# Patient Record
Sex: Male | Born: 1990
Health system: Southern US, Community
[De-identification: ages and names within clinical notes are randomized; demographics above are authoritative.]

## PROBLEM LIST (undated history)

## (undated) DIAGNOSIS — I10 Essential (primary) hypertension: Secondary | ICD-10-CM

## (undated) DIAGNOSIS — G118 Other hereditary ataxias: Secondary | ICD-10-CM

## (undated) DIAGNOSIS — F32A Depression, unspecified: Secondary | ICD-10-CM

## (undated) HISTORY — PX: HERNIA REPAIR: SHX51

## (undated) HISTORY — PX: OTHER SURGICAL HISTORY: SHX169

---

## 2011-04-10 ENCOUNTER — Encounter (HOSPITAL_COMMUNITY): Payer: Self-pay

## 2011-04-10 ENCOUNTER — Emergency Department (HOSPITAL_COMMUNITY)
Admission: EM | Admit: 2011-04-10 | Discharge: 2011-04-10 | Disposition: A | Discharge status: Home / Self Care | Payer: Worker's Compensation | Attending: Emergency Medicine | Admitting: Emergency Medicine

## 2011-04-10 DIAGNOSIS — M25519 Pain in unspecified shoulder: Secondary | ICD-10-CM | POA: Insufficient documentation

## 2011-04-10 DIAGNOSIS — Y92410 Unspecified street and highway as the place of occurrence of the external cause: Secondary | ICD-10-CM | POA: Insufficient documentation
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
## 2015-06-01 ENCOUNTER — Emergency Department (HOSPITAL_COMMUNITY)
Admission: EM | Admit: 2015-06-01 | Discharge: 2015-06-01 | Disposition: A | Discharge status: Home / Self Care | Payer: 59 | Attending: Emergency Medicine | Admitting: Emergency Medicine

## 2015-06-01 ENCOUNTER — Emergency Department (HOSPITAL_COMMUNITY): Payer: 59

## 2015-06-01 ENCOUNTER — Encounter (HOSPITAL_COMMUNITY): Payer: Self-pay | Admitting: Emergency Medicine

## 2015-06-01 DIAGNOSIS — Y998 Other external cause status: Secondary | ICD-10-CM | POA: Insufficient documentation

## 2015-06-01 DIAGNOSIS — S62650A Nondisplaced fracture of medial phalanx of right index finger, initial encounter for closed fracture: Secondary | ICD-10-CM | POA: Diagnosis not present

## 2015-06-01 DIAGNOSIS — Y9389 Activity, other specified: Secondary | ICD-10-CM | POA: Insufficient documentation

## 2015-06-01 DIAGNOSIS — S199XXA Unspecified injury of neck, initial encounter: Secondary | ICD-10-CM | POA: Insufficient documentation

## 2015-06-01 DIAGNOSIS — S62609A Fracture of unspecified phalanx of unspecified finger, initial encounter for closed fracture: Secondary | ICD-10-CM

## 2015-06-01 DIAGNOSIS — S6991XA Unspecified injury of right wrist, hand and finger(s), initial encounter: Secondary | ICD-10-CM | POA: Diagnosis present

## 2015-06-01 DIAGNOSIS — S4992XA Unspecified injury of left shoulder and upper arm, initial encounter: Secondary | ICD-10-CM | POA: Diagnosis not present

## 2015-06-01 DIAGNOSIS — Y92410 Unspecified street and highway as the place of occurrence of the external cause: Secondary | ICD-10-CM | POA: Diagnosis not present

## 2015-06-01 MED ORDER — TRAMADOL HCL 50 MG PO TABS: 50.0000 mg | ORAL_TABLET | Freq: Four times a day (QID) | ORAL | Status: DC | PRN

## 2015-06-01 MED ORDER — TRAMADOL HCL 50 MG PO TABS
50.0000 mg | ORAL_TABLET | Freq: Once | ORAL | Status: AC
  Administered 2015-06-01: 50 mg via ORAL
  Filled 2015-06-01: qty 1

## 2015-06-01 MED ORDER — NAPROXEN 500 MG PO TABS: 500.0000 mg | ORAL_TABLET | Freq: Two times a day (BID) | ORAL | Status: DC

## 2015-06-01 MED ORDER — CYCLOBENZAPRINE HCL 10 MG PO TABS
5.0000 mg | ORAL_TABLET | Freq: Once | ORAL | Status: AC
  Administered 2015-06-01: 5 mg via ORAL
  Filled 2015-06-01: qty 1

## 2015-06-01 MED ORDER — CYCLOBENZAPRINE HCL 10 MG PO TABS: 10.0000 mg | ORAL_TABLET | Freq: Two times a day (BID) | ORAL | Status: DC | PRN

## 2015-06-01 NOTE — Discharge Instructions (Signed)
Finger Fracture °Fractures of fingers are breaks in the bones of the fingers. There are many types of fractures. There are different ways of treating these fractures. Your health care provider will discuss the best way to treat your fracture. °CAUSES °Traumatic injury is the main cause of broken fingers. These include: °· Injuries while playing sports. °· Workplace injuries. °· Falls. °RISK FACTORS °Activities that can increase your risk of finger fractures include: °· Sports. °· Workplace activities that involve machinery. °· A condition called osteoporosis, which can make your bones less dense and cause them to fracture more easily. °SIGNS AND SYMPTOMS °The main symptoms of a broken finger are pain and swelling within 15 minutes after the injury. Other symptoms include: °· Bruising of your finger. °· Stiffness of your finger. °· Numbness of your finger. °· Exposed bones (compound fracture) if the fracture is severe. °DIAGNOSIS  °The best way to diagnose a broken bone is with X-ray imaging. Additionally, your health care provider will use this X-ray image to evaluate the position of the broken finger bones.  °TREATMENT  °Finger fractures can be treated with:  °· Nonreduction--This means the bones are in place. The finger is splinted without changing the positions of the bone pieces. The splint is usually left on for about a week to 10 days. This will depend on your fracture and what your health care provider thinks. °· Closed reduction--The bones are put back into position without using surgery. The finger is then splinted. °· Open reduction and internal fixation--The fracture site is opened. Then the bone pieces are fixed into place with pins or some type of hardware. This is seldom required. It depends on the severity of the fracture. °HOME CARE INSTRUCTIONS  °· Follow your health care provider's instructions regarding activities, exercises, and physical therapy. °· Only take over-the-counter or prescription  medicines for pain, discomfort, or fever as directed by your health care provider. °SEEK MEDICAL CARE IF: °You have pain or swelling that limits the motion or use of your fingers. °SEEK IMMEDIATE MEDICAL CARE IF:  °Your finger becomes numb. °MAKE SURE YOU:  °· Understand these instructions. °· Will watch your condition. °· Will get help right away if you are not doing well or get worse. °  °This information is not intended to replace advice given to you by your health care provider. Make sure you discuss any questions you have with your health care provider. °  °Document Released: 06/11/2000 Document Revised: 12/18/2012 Document Reviewed: 10/09/2012 °Elsevier Interactive Patient Education ©2016 [employer] ° °Motor Vehicle Collision °It is common to have multiple bruises and sore muscles after a motor vehicle collision (MVC). These tend to feel worse for the first 24 hours. You may have the most stiffness and soreness over the first several hours. You may also feel worse when you wake up the first morning after your collision. After this point, you will usually begin to improve with each day. The speed of improvement often depends on the severity of the collision, the number of injuries, and the location and nature of these injuries. °HOME CARE INSTRUCTIONS °· Put ice on the injured area. °¨ Put ice in a plastic bag. °¨ Place a towel between your skin and the bag. °¨ Leave the ice on for 15-20 minutes, 3-4 times a day, or as directed by your health care provider. °· Drink enough fluids to keep your urine clear or pale yellow. Do not drink alcohol. °· Take a warm shower or bath once or   twice a day. This will increase blood flow to sore muscles. °· You may return to activities as directed by your caregiver. Be careful when lifting, as this may aggravate neck or back pain. °· Only take over-the-counter or prescription medicines for pain, discomfort, or fever as directed by your caregiver. Do not use aspirin. This may  increase bruising and bleeding. °SEEK IMMEDIATE MEDICAL CARE IF: °· You have numbness, tingling, or weakness in the arms or legs. °· You develop severe headaches not relieved with medicine. °· You have severe neck pain, especially tenderness in the middle of the back of your neck. °· You have changes in bowel or bladder control. °· There is increasing pain in any area of the body. °· You have shortness of breath, light-headedness, dizziness, or fainting. °· You have chest pain. °· You feel sick to your stomach (nauseous), throw up (vomit), or sweat. °· You have increasing abdominal discomfort. °· There is blood in your urine, stool, or vomit. °· You have pain in your shoulder (shoulder strap areas). °· You feel your symptoms are getting worse. °MAKE SURE YOU: °· Understand these instructions. °· Will watch your condition. °· Will get help right away if you are not doing well or get worse. °  °This information is not intended to replace advice given to you by your health care provider. Make sure you discuss any questions you have with your health care provider. °  °Document Released: 02/27/2005 Document Revised: 03/20/2014 Document Reviewed: 07/27/2010 °Elsevier Interactive Patient Education ©2016 [employer] Inc. ° °
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
## 2016-01-28 ENCOUNTER — Encounter (HOSPITAL_BASED_OUTPATIENT_CLINIC_OR_DEPARTMENT_OTHER): Payer: Self-pay

## 2016-01-28 ENCOUNTER — Emergency Department (HOSPITAL_BASED_OUTPATIENT_CLINIC_OR_DEPARTMENT_OTHER): Payer: Worker's Compensation

## 2016-01-28 ENCOUNTER — Emergency Department (HOSPITAL_BASED_OUTPATIENT_CLINIC_OR_DEPARTMENT_OTHER)
Admission: EM | Admit: 2016-01-28 | Discharge: 2016-01-28 | Disposition: A | Discharge status: Home / Self Care | Payer: Worker's Compensation | Attending: Emergency Medicine | Admitting: Emergency Medicine

## 2016-01-28 DIAGNOSIS — S43004A Unspecified dislocation of right shoulder joint, initial encounter: Secondary | ICD-10-CM | POA: Insufficient documentation

## 2016-01-28 DIAGNOSIS — Y990 Civilian activity done for income or pay: Secondary | ICD-10-CM | POA: Diagnosis not present

## 2016-01-28 DIAGNOSIS — I10 Essential (primary) hypertension: Secondary | ICD-10-CM | POA: Diagnosis not present

## 2016-01-28 DIAGNOSIS — Y929 Unspecified place or not applicable: Secondary | ICD-10-CM | POA: Insufficient documentation

## 2016-01-28 DIAGNOSIS — S4991XA Unspecified injury of right shoulder and upper arm, initial encounter: Secondary | ICD-10-CM | POA: Diagnosis present

## 2016-01-28 DIAGNOSIS — W010XXA Fall on same level from slipping, tripping and stumbling without subsequent striking against object, initial encounter: Secondary | ICD-10-CM | POA: Insufficient documentation

## 2016-01-28 DIAGNOSIS — Y939 Activity, unspecified: Secondary | ICD-10-CM | POA: Diagnosis not present

## 2016-01-28 HISTORY — DX: Essential (primary) hypertension: I10

## 2016-01-28 MED ORDER — METHOCARBAMOL 1000 MG/10ML IJ SOLN
1000.0000 mg | Freq: Once | INTRAMUSCULAR | Status: AC
  Administered 2016-01-28: 1000 mg via INTRAVENOUS
  Filled 2016-01-28: qty 10

## 2016-01-28 MED ORDER — FENTANYL CITRATE (PF) 100 MCG/2ML IJ SOLN
50.0000 ug | Freq: Once | INTRAMUSCULAR | Status: AC
  Administered 2016-01-28: 50 ug via INTRAVENOUS
  Filled 2016-01-28: qty 2

## 2016-01-28 MED ORDER — KETOROLAC TROMETHAMINE 30 MG/ML IJ SOLN
INTRAMUSCULAR | Status: AC
  Administered 2016-01-28: 30 mg
  Filled 2016-01-28: qty 1

## 2016-01-28 MED ORDER — DICLOFENAC SODIUM ER 100 MG PO TB24: 100.0000 mg | ORAL_TABLET | Freq: Every day | ORAL | 0 refills | Status: DC

## 2016-01-28 NOTE — ED Provider Notes (Signed)
[facility] Provider Note   CSN: [id number] Arrival date & time: 01/28/16  [id number]     History   Chief Complaint Chief Complaint  Patient presents with  . Shoulder Pain    HPI Samuel Stanley is a 25 y.o. male.  The history is provided by the patient.  Shoulder Pain   This is a new problem. The current episode started 3 to 5 hours ago. The problem occurs constantly. The problem has not changed since onset.The pain is present in the right shoulder. The pain is severe. Associated symptoms include limited range of motion. He has tried nothing for the symptoms. The treatment provided mild relief. There has been a history of trauma (fell with hand outward). Family history is significant for no gout.    Past Medical History:  Diagnosis Date  . Hypertension     There are no active problems to display for this patient.   Past Surgical History:  Procedure Laterality Date  . HERNIA REPAIR    . testicular torsion         Home Medications    Prior to Admission medications   Not on File    Family History No family history on file.  Social History Social History  Substance Use Topics  . Smoking status: Never Smoker  . Smokeless tobacco: Never Used  . Alcohol use No     Allergies   Patient has no known allergies.   Review of Systems Review of Systems  Musculoskeletal: Positive for arthralgias.  Neurological: Negative for dizziness, facial asymmetry and headaches.  All other systems reviewed and are negative.    Physical Exam Updated Vital Signs BP 141/98 (BP Location: Left Arm)   Pulse 106   Temp 98.7 F (37.1 C) (Oral)   Resp 18   Ht 6' (1.829 m)   Wt 275 lb (124.7 kg)   SpO2 98%   BMI 37.30 kg/m   Physical Exam  Constitutional: He is oriented to person, place, and time. He appears well-developed and well-nourished.  HENT:  Head: Normocephalic and atraumatic.  Eyes: Conjunctivae and EOM are normal.  Neck: Normal range of motion. Neck  supple.  Cardiovascular: Normal rate, regular rhythm and intact distal pulses.   Pulmonary/Chest: Effort normal and breath sounds normal. He has no wheezes. He has no rales.  Abdominal: Soft. Bowel sounds are normal. There is no tenderness.  Musculoskeletal:       Right shoulder: He exhibits decreased range of motion and deformity. He exhibits no swelling, no effusion, no crepitus, no laceration, no spasm and normal pulse.       Right elbow: Normal.      Right wrist: Normal.       Right hand: Normal. Normal sensation noted. Normal strength noted.  Neurological: He is alert and oriented to person, place, and time.  Skin: Skin is warm and dry. Capillary refill takes less than 2 seconds.     ED Treatments / Results   Vitals:   01/28/16 0415 01/28/16 0430  BP: 117/94 136/79  Pulse: 79 77  Resp:    Temp:      Radiology Dg Shoulder Right  Result Date: 01/28/2016 CLINICAL DATA:  Larey SeatFell at work 3 hours ago EXAM: RIGHT SHOULDER - 2+ VIEW COMPARISON:  None. FINDINGS: A single view of the right shoulder demonstrates glenohumeral dislocation, probably anterior subcoracoid. No fracture is evident. IMPRESSION: Right shoulder dislocation Electronically Signed   By: Ellery Plunkaniel R Mitchell M.D.   On: 01/28/2016 03:09  Procedures Reduction of dislocation Date/Time: 01/28/2016 4:30 AM Performed by: Cy BlamerPALUMBO, Samuel Stanley Authorized by: Cy BlamerPALUMBO, Niylah Hassan  Consent: Verbal consent obtained. Consent given by: patient Patient identity confirmed: arm band Preparation: Patient was prepped and draped in the usual sterile fashion. Local anesthesia used: no  Anesthesia: Local anesthesia used: no  Sedation: Patient sedated: no Patient tolerance: Patient tolerated the procedure well with no immediate complications Comments: Stimson maneuver:  Fentanyl, Toradol and robaxin used with 10 lbs of weights.  Padding around elbow, patient in the prone position and 10 lbs of weights in ortho sock hung from around elbow.   Patient felt "pop" after 25 minutes.  FROM of the right shoulder thereafter.  Patient pain free.  Shoulder immobilizer placed.  Post reduction films obtained.      (including critical care time)  Medications Ordered in ED Medications  ketorolac (TORADOL) 30 MG/ML injection (30 mg  Given 01/28/16 0307)  methocarbamol (ROBAXIN) injection 1,000 mg (1,000 mg Intravenous Given 01/28/16 0359)  fentaNYL (SUBLIMAZE) injection 50 mcg (50 mcg Intravenous Given 01/28/16 0347)      Final Clinical Impressions(s) / ED Diagnoses  Anterior shoulder dislocation:  Cannot work until cleared by orthopedics. Schedule an appointment for this week.  Continue to ice the shoulder.  Wear shoulder immobilizer at all times until instructed to stop by orthopedics.  All questions answered to patient's satisfaction. Based on history and exam patient has been appropriately medically screened and emergency conditions excluded. Patient is stable for discharge at this time. Follow up with your PMD for recheck in 2 days and strict return precautions given.     Cy BlamerApril Vaniyah Lansky, MD 01/28/16 [phone]

## 2016-01-28 NOTE — ED Triage Notes (Signed)
Pt states at work, tripped over plastic and as falling caught his fall with rt hand, heard a pop; rt shoulder deformity noted

## 2016-01-28 NOTE — ED Notes (Signed)
Back from xray, alert, NAD, calm, interactive.  

## 2016-01-28 NOTE — ED Notes (Signed)
Post reduction film complete, shoulder reduced, shoulder immobilizer in place, VSS, "feel better".

## 2016-01-28 NOTE — ED Notes (Signed)
To x-ray

## 2016-01-28 NOTE — ED Notes (Signed)
[facility] placed prone, R arm hanging anteriorly, attempting reduction by Stimson maneuver. Tolerating well, denies pain at this time.

## 2016-01-28 NOTE — ED Notes (Addendum)
Shoulder reduced, repositioned supine, HOB 45 degrees, "feel better", alert, NAD, calm, interactive. Pending post reduction films. VSS. CMS intact, cap refill <2sec.

## 2016-02-17 ENCOUNTER — Ambulatory Visit (INDEPENDENT_AMBULATORY_CARE_PROVIDER_SITE_OTHER): Payer: Self-pay | Admitting: Orthopedic Surgery

## 2016-03-01 ENCOUNTER — Encounter (INDEPENDENT_AMBULATORY_CARE_PROVIDER_SITE_OTHER): Payer: Self-pay | Admitting: Orthopedic Surgery

## 2016-03-01 ENCOUNTER — Encounter (INDEPENDENT_AMBULATORY_CARE_PROVIDER_SITE_OTHER): Payer: Self-pay | Admitting: Radiology

## 2016-03-01 ENCOUNTER — Ambulatory Visit (INDEPENDENT_AMBULATORY_CARE_PROVIDER_SITE_OTHER): Payer: Worker's Compensation | Admitting: Orthopedic Surgery

## 2016-03-01 DIAGNOSIS — S43014A Anterior dislocation of right humerus, initial encounter: Secondary | ICD-10-CM | POA: Insufficient documentation

## 2016-03-01 DIAGNOSIS — S43014D Anterior dislocation of right humerus, subsequent encounter: Secondary | ICD-10-CM

## 2016-03-01 NOTE — Progress Notes (Signed)
   Office Visit Note   Patient: Samuel Stanley           Date of Birth: October 11, 1990           MRN: [id number] Visit Date: 03/01/2016 Requested by: No referring provider defined for this encounter. PCP: No primary care provider on file.  Subjective: Chief Complaint  Patient presents with  . Right Shoulder - Pain    HPIJustin Margo Stanley is a 25 year old patient with anterior shoulder dislocation.  This occurred 01/28/2016.  He has been in a sling since that time.  He fell at work with his hand outstretched in front.  This did require reduction in the emergency room.  He works in [employer] but his job is primarily operating a Chief Executive Officerforklift.  He states his shoulder is feeling good.  There is no history of prior dislocation or prior shoulder issue.              Review of Systems All systems reviewed are negative as they relate to the chief complaint within the history of present illness.  Patient denies  fevers or chills.    Assessment & Plan: Visit Diagnoses:  1. Anterior dislocation of right shoulder, subsequent encounter     Plan: Impression is right shoulder anterior dislocation now r evidence of rotator cuff weakness.  Plan is allow him to return to work tomorrow with no overhead lifting greater than 10 pounds.  I would like him to do 4 weeks of therapy 1-2 times a week to work on strengthening.  He has about a 40% chance of re-dislocating the shoulder.  He does not do much in terms of overhead activity either at work or in the recreational since.  I'll see him back in 4 weeks for clinical recheck and likely release at that time  Follow-Up Instructions: Return in about 4 weeks (around 03/29/2016).   Orders:  No orders of the defined types were placed in this encounter.  No orders of the defined types were placed in this encounter.     Procedures: No procedures performed   Clinical Data: No additional findings.  Objective: Vital Signs: There were no vitals taken for this  visit.  Physical Exam  Ortho Exam  Specialty Comments:  No specialty comments available.  Imaging: No results found.   PMFS History: Patient Active Problem List   Diagnosis Date Noted  . Anterior dislocation of right shoulder 03/01/2016   Past Medical History:  Diagnosis Date  . Hypertension     No family history on file.  Past Surgical History:  Procedure Laterality Date  . HERNIA REPAIR    . testicular torsion     Social History   Occupational History  . Not on file.   Social History Main Topics  . Smoking status: Never Smoker  . Smokeless tobacco: Never Used  . Alcohol use No  . Drug use: No  . Sexual activity: Not on file

## 2016-03-02 NOTE — Progress Notes (Signed)
Faxed PT rx and Dr. Diamantina Providenceean's last office note to adj Madison HickmanMatt Quales  Fax#[phone]

## 2016-03-23 ENCOUNTER — Telehealth (INDEPENDENT_AMBULATORY_CARE_PROVIDER_SITE_OTHER): Payer: Self-pay | Admitting: Orthopedic Surgery

## 2016-03-23 NOTE — Telephone Encounter (Signed)
Kristen with [facility] called needing Rx for (PT) faxed over to her. The fax # is [phone]  The phone number is [phone]   Reference # is [id number]

## 2016-03-24 NOTE — Telephone Encounter (Signed)
faxed

## 2016-03-28 NOTE — Progress Notes (Signed)
PT is approved and was sent to [facility] for scheduling

## 2016-03-29 ENCOUNTER — Ambulatory Visit (INDEPENDENT_AMBULATORY_CARE_PROVIDER_SITE_OTHER): Payer: Self-pay | Admitting: Orthopedic Surgery

## 2016-04-25 ENCOUNTER — Ambulatory Visit (INDEPENDENT_AMBULATORY_CARE_PROVIDER_SITE_OTHER): Payer: Worker's Compensation | Admitting: Orthopedic Surgery

## 2016-04-25 ENCOUNTER — Encounter (INDEPENDENT_AMBULATORY_CARE_PROVIDER_SITE_OTHER): Payer: Self-pay | Admitting: Orthopedic Surgery

## 2016-04-25 DIAGNOSIS — S43014D Anterior dislocation of right humerus, subsequent encounter: Secondary | ICD-10-CM | POA: Diagnosis not present

## 2016-04-25 NOTE — Progress Notes (Signed)
Office Visit Note   Patient: Samuel Stanley           Date of Birth: 1990/05/30           MRN: [id number] Visit Date: 04/25/2016 Requested by: No referring provider defined for this encounter. PCP: No primary care provider on file.  Subjective: Chief Complaint  Patient presents with  . Right Shoulder - Follow-up    HPI Samuel Stanley is a [age] show patient with right shoulder dislocation.  Dislocation occurred 01/18/2016.  His back to driving a forklift.  He is doing fine.  He has no shoulder pain and no episodes of symptomatic instability with the shoulder.  He takes Advil as needed.  He denies any weakness.              Review of Systems All systems reviewed are negative as they relate to the chief complaint within the history of present illness.  Patient denies  fevers or chills.    Assessment & Plan: Visit Diagnoses:  1. Anterior dislocation of right shoulder, subsequent encounter     Plan:Right shoulder dislocation with no sequelae.  He has excellent strength and only slightly limited external rotation at 15 of abduction.  Rotator cuff strength is excellent.  He's having no mechanical symptoms in the shoulder.  He is released back to regular duty work at this time with no restrictions.  No rating required because he is excellent range of motion.  Excellent strength.  Only slightly restricted on range of motion but I think that that will improve over time.  I still think he is at risk for dislocation with high risk activities but he doesn't do much high risk activities as I think he should be okay.  I will see him back as needed.  Patient is at maximal medical improvement  Follow-Up Instructions: No Follow-up on file.   Orders:  No orders of the defined types were placed in this encounter.  No orders of the defined types were placed in this encounter.     Procedures: No procedures performed   Clinical Data: No additional findings.  Objective: Vital Signs: There were no vitals  taken for this visit.  Physical Exam   Constitutional: Patient appears well-developed HEENT:  Head: Normocephalic Eyes:EOM are normal Neck: Normal range of motion Cardiovascular: Normal rate Pulmonary/chest: Effort normal Neurologic: Patient is alert Skin: Skin is warm Psychiatric: Patient has normal mood and affect   Ortho Exam  orthopedic exam demonstrates good cervical spine range of motion excellent rotator cuff strength on the right to isolated and statusand subscap muscle testing.  Motor sensory function to the hand is intact shoulder stability is excellent anterior posterior with no apprehension and negative relocation testing he's restricted about 15 more external rotation on the right compared to the left.  No course grinding or crepitus with passive range of motion of the shoulder   Specialty Comments:  No specialty comments available.  Imaging: No results found.   PMFS History: Patient Active Problem List   Diagnosis Date Noted  . Anterior dislocation of right shoulder 03/01/2016   Past Medical History:  Diagnosis Date  . Hypertension     No family history on file.  Past Surgical History:  Procedure Laterality Date  . HERNIA REPAIR    . testicular torsion     Social History   Occupational History  . Not on file.   Social History Main Topics  . Smoking status: Never Smoker  . Smokeless tobacco: Never Used  .  Alcohol use No  . Drug use: No  . Sexual activity: Not on file

## 2017-08-14 ENCOUNTER — Ambulatory Visit (INDEPENDENT_AMBULATORY_CARE_PROVIDER_SITE_OTHER): Payer: 59

## 2017-08-14 ENCOUNTER — Ambulatory Visit (INDEPENDENT_AMBULATORY_CARE_PROVIDER_SITE_OTHER): Payer: 59 | Admitting: Orthopaedic Surgery

## 2017-08-14 DIAGNOSIS — M25572 Pain in left ankle and joints of left foot: Secondary | ICD-10-CM

## 2017-08-14 NOTE — Progress Notes (Signed)
Office Visit Note   Patient: Samuel Stanley           Date of Birth: 11/07/1990           MRN: [id number] Visit Date: 08/14/2017              Requested by: No referring provider defined for this encounter. PCP: Ila McgillAllwardt, Alyssa, PA   Assessment & Plan: Visit Diagnoses:  1. Pain in left ankle and joints of left foot     Plan: Impression contusion left ankle sprain.  Recommend ASO brace for support.  Referral for physical therapy was provided today.  Desk duty for 1 month.  Follow-up in 4 weeks for recheck.  Follow-Up Instructions: Return in about 1 month (around 09/11/2017).   Orders:  Orders Placed This Encounter  Procedures  . XR Foot Complete Left  . XR Ankle 2 Views Left   No orders of the defined types were placed in this encounter.     Procedures: No procedures performed   Clinical Data: No additional findings.   Subjective: Chief Complaint  Patient presents with  . Left Ankle - Pain    DOI 07/24/17 - dropped jar of pickles on foot  . Left Foot - Pain    DOI - 07/31/17 stepped in a hole, twisting ankle    Patient comes in with 2 injuries to his left ankle and foot in the recent history.  States that he had a jar pickles fell directly on top of his foot and then a week later he twisted his ankle while stepping into a hole.  He denies any numbness and tingling.  The pain is worse with weightbearing.  Is tender to palpation.  Denies any numbness and tingling.   Review of Systems  Constitutional: Negative.   All other systems reviewed and are negative.    Objective: Vital Signs: There were no vitals taken for this visit.  Physical Exam  Constitutional: He is oriented to person, place, and time. He appears well-developed and well-nourished.  HENT:  Head: Normocephalic and atraumatic.  Eyes: Pupils are equal, round, and reactive to light.  Neck: Neck supple.  Pulmonary/Chest: Effort normal.  Abdominal: Soft.  Musculoskeletal: Normal range of motion.    Neurological: He is alert and oriented to person, place, and time.  Skin: Skin is warm.  Psychiatric: He has a normal mood and affect. His behavior is normal. Judgment and thought content normal.  Nursing note and vitals reviewed.   Ortho Exam Left ankle foot exam shows no significant swelling.  He does have tenderness palpation of the metatarsal along the dorsal aspect.  The ATFL is tender to palpation.  Ankle joint is stable. Specialty Comments:  No specialty comments available.  Imaging: Xr Foot Complete Left  Result Date: 08/14/2017 No acute or structural abnormalities  Xr Ankle 2 Views Left  Result Date: 08/14/2017 No acute or structural abnormalities.    PMFS History: Patient Active Problem List   Diagnosis Date Noted  . Anterior dislocation of right shoulder 03/01/2016   Past Medical History:  Diagnosis Date  . Hypertension     No family history on file.  Past Surgical History:  Procedure Laterality Date  . HERNIA REPAIR    . testicular torsion     Social History   Occupational History  . Not on file  Tobacco Use  . Smoking status: Never Smoker  . Smokeless tobacco: Never Used  Substance and Sexual Activity  . Alcohol use: No  .  Drug use: No  . Sexual activity: Not on file       

## 2017-08-15 ENCOUNTER — Telehealth (INDEPENDENT_AMBULATORY_CARE_PROVIDER_SITE_OTHER): Payer: Self-pay | Admitting: Orthopaedic Surgery

## 2017-08-15 NOTE — Telephone Encounter (Signed)
Patient called wanting to know the status of his FMLA papers. CB # [id number]

## 2017-08-28 ENCOUNTER — Ambulatory Visit (INDEPENDENT_AMBULATORY_CARE_PROVIDER_SITE_OTHER): Payer: Self-pay | Admitting: Orthopaedic Surgery

## 2017-09-11 ENCOUNTER — Encounter (INDEPENDENT_AMBULATORY_CARE_PROVIDER_SITE_OTHER): Payer: Self-pay | Admitting: Orthopaedic Surgery

## 2017-09-11 ENCOUNTER — Ambulatory Visit (INDEPENDENT_AMBULATORY_CARE_PROVIDER_SITE_OTHER): Payer: 59 | Admitting: Orthopaedic Surgery

## 2017-09-11 VITALS — Ht 72.0 in | Wt 275.0 lb

## 2017-09-11 DIAGNOSIS — M25572 Pain in left ankle and joints of left foot: Secondary | ICD-10-CM | POA: Diagnosis not present

## 2017-09-11 NOTE — Progress Notes (Signed)
   Office Visit Note   Patient: Samuel Stanley           Date of Birth: 13-Aug-1990           MRN: [id number] Visit Date: 09/11/2017              Requested by: Allwardt, [place] Inn[place], PA [address] [place], [place] [id number] PCP: Ila McgillAllwardt, Alyssa, PA   Assessment & Plan: Visit Diagnoses:  1. Pain in left ankle and joints of left foot     Plan: Patient has completely recovered from his ankle sprain.  At this point we will release him back to full duty.  Follow-up as needed.  Wean ASO brace as tolerated.  Follow-Up Instructions: Return if symptoms worsen or fail to improve.   Orders:  No orders of the defined types were placed in this encounter.  No orders of the defined types were placed in this encounter.     Procedures: No procedures performed   Clinical Data: No additional findings.   Subjective: Chief Complaint  Patient presents with  . Left Ankle - Follow-up    Patient follows up for his left ankle sprain.  He is feeling 100%.  He has finished physical therapy.  This point he is ready to go back to work.  He has no complaints.   Review of Systems   Objective: Vital Signs: Ht 6' (1.829 m)   Wt 275 lb (124.7 kg)   BMI 37.30 kg/m   Physical Exam  Ortho Exam Left ankle exam is completely benign and unremarkable. Specialty Comments:  No specialty comments available.  Imaging: No results found.   PMFS History: Patient Active Problem List   Diagnosis Date Noted  . Anterior dislocation of right shoulder 03/01/2016   Past Medical History:  Diagnosis Date  . Hypertension     No family history on file.  Past Surgical History:  Procedure Laterality Date  . HERNIA REPAIR    . testicular torsion     Social History   Occupational History  . Not on file  Tobacco Use  . Smoking status: Never Smoker  . Smokeless tobacco: Never Used  Substance and Sexual Activity  . Alcohol use: No  . Drug use: No  . Sexual activity: Not on file

## 2017-09-20 ENCOUNTER — Other Ambulatory Visit: Payer: Self-pay

## 2017-09-20 ENCOUNTER — Encounter (HOSPITAL_BASED_OUTPATIENT_CLINIC_OR_DEPARTMENT_OTHER): Payer: Self-pay | Admitting: Emergency Medicine

## 2017-09-20 ENCOUNTER — Emergency Department (HOSPITAL_BASED_OUTPATIENT_CLINIC_OR_DEPARTMENT_OTHER)
Admission: EM | Admit: 2017-09-20 | Discharge: 2017-09-20 | Disposition: A | Discharge status: Home / Self Care | Payer: No Typology Code available for payment source | Attending: Emergency Medicine | Admitting: Emergency Medicine

## 2017-09-20 ENCOUNTER — Emergency Department (HOSPITAL_BASED_OUTPATIENT_CLINIC_OR_DEPARTMENT_OTHER): Payer: No Typology Code available for payment source

## 2017-09-20 DIAGNOSIS — Y9389 Activity, other specified: Secondary | ICD-10-CM | POA: Diagnosis not present

## 2017-09-20 DIAGNOSIS — I10 Essential (primary) hypertension: Secondary | ICD-10-CM | POA: Insufficient documentation

## 2017-09-20 DIAGNOSIS — S91311A Laceration without foreign body, right foot, initial encounter: Secondary | ICD-10-CM | POA: Diagnosis present

## 2017-09-20 DIAGNOSIS — Y990 Civilian activity done for income or pay: Secondary | ICD-10-CM | POA: Insufficient documentation

## 2017-09-20 DIAGNOSIS — W450XXA Nail entering through skin, initial encounter: Secondary | ICD-10-CM | POA: Insufficient documentation

## 2017-09-20 DIAGNOSIS — Y929 Unspecified place or not applicable: Secondary | ICD-10-CM | POA: Insufficient documentation

## 2017-09-20 MED ORDER — LIDOCAINE-EPINEPHRINE (PF) 2 %-1:200000 IJ SOLN
10.0000 mL | Freq: Once | INTRAMUSCULAR | Status: AC
  Administered 2017-09-20: 10 mL via INTRADERMAL
  Filled 2017-09-20: qty 10

## 2017-09-20 MED ORDER — CIPROFLOXACIN HCL 500 MG PO TABS
500.0000 mg | ORAL_TABLET | Freq: Once | ORAL | Status: AC
  Administered 2017-09-20: 500 mg via ORAL
  Filled 2017-09-20: qty 1

## 2017-09-20 MED ORDER — LIDOCAINE-EPINEPHRINE-TETRACAINE (LET) SOLUTION
3.0000 mL | Freq: Once | NASAL | Status: AC
  Administered 2017-09-20: 3 mL via TOPICAL
  Filled 2017-09-20: qty 3

## 2017-09-20 MED ORDER — CIPROFLOXACIN HCL 500 MG PO TABS: 500.0000 mg | ORAL_TABLET | Freq: Two times a day (BID) | ORAL | 0 refills | Status: DC

## 2017-09-20 NOTE — ED Notes (Signed)
Pt soaking foot in iodine diluted with sterile water solution

## 2017-09-20 NOTE — ED Triage Notes (Signed)
Pt stepped back into a wooden pallet at work and sustained laceration to posterior right heal.

## 2017-09-20 NOTE — ED Notes (Signed)
Pt foot removed from soak and cleansed with soap and water.

## 2017-09-20 NOTE — ED Provider Notes (Signed)
[facility] EMERGENCY DEPARTMENT Provider Note   CSN: [id number] Arrival date & time: 09/20/17  [id number]     History   Chief Complaint Chief Complaint  Patient presents with  . Extremity Laceration    HPI Samuel Stanley is a 27 y.o. male.  The history is provided by the patient.  Laceration   The incident occurred 1 to 2 hours ago. The laceration is located on the right foot. The laceration is 3 cm (2nd superficial laceration 1 cm, both hemostatic) in size. The laceration mechanism was a a nail (nail through a old tennis shoe). The pain is at a severity of 3/10. The pain is mild. The pain has been constant since onset. He reports no foreign bodies present. His tetanus status is UTD.  Backed up into a wooden pallet at work and nail went through the rubber of the tennis shoe.  Has a 3 cm laceration to the heel R foot. And superficial 1 cm lac to medial right foot  Past Medical History:  Diagnosis Date  . Hypertension     Patient Active Problem List   Diagnosis Date Noted  . Anterior dislocation of right shoulder 03/01/2016    Past Surgical History:  Procedure Laterality Date  . HERNIA REPAIR    . testicular torsion          Home Medications    Prior to Admission medications   Medication Sig Start Date End Date Taking? Authorizing Provider  ciprofloxacin (CIPRO) 500 MG tablet Take 1 tablet (500 mg total) by mouth 2 (two) times daily. One po bid x 7 days 09/20/17   Nicanor AlconPalumbo, Logen Heintzelman, MD    Family History No family history on file.  Social History Social History   Tobacco Use  . Smoking status: Never Smoker  . Smokeless tobacco: Never Used  Substance Use Topics  . Alcohol use: No  . Drug use: No     Allergies   Patient has no known allergies.   Review of Systems Review of Systems  Constitutional: Negative for fever.  Skin: Positive for wound. Negative for color change.  Neurological: Negative for weakness and numbness.  All other systems reviewed  and are negative.    Physical Exam Updated Vital Signs BP (!) 147/97 (BP Location: Left Arm)   Pulse 86   Temp 99.2 F (37.3 C) (Oral)   Resp 18   Ht 6' (1.829 m)   Wt 105.7 kg (233 lb)   SpO2 100%   BMI 31.60 kg/m   Physical Exam  Constitutional: He is oriented to person, place, and time. He appears well-developed and well-nourished. No distress.  HENT:  Head: Normocephalic and atraumatic.  Mouth/Throat: No oropharyngeal exudate.  Eyes: Pupils are equal, round, and reactive to light. Conjunctivae are normal.  Neck: Normal range of motion. Neck supple.  Cardiovascular: Normal rate, regular rhythm, normal heart sounds and intact distal pulses.  Pulmonary/Chest: Effort normal and breath sounds normal. No stridor. He has no wheezes. He has no rales.  Abdominal: Soft. Bowel sounds are normal. He exhibits no mass. There is no tenderness. There is no guarding.  Musculoskeletal: Normal range of motion.       Feet:  Neurological: He is alert and oriented to person, place, and time. He displays normal reflexes.  Skin: Skin is warm and dry. Capillary refill takes less than 2 seconds.  Psychiatric: He has a normal mood and affect.     ED Treatments / Results  Labs (all labs ordered are  listed, but only abnormal results are displayed) Labs Reviewed - No data to display  EKG None  Radiology Dg Foot Complete Right  Result Date: 09/20/2017 CLINICAL DATA:  Right foot pain. Stepped onto a wooden Pallet this morning laceration. Question foreign body. EXAM: RIGHT FOOT COMPLETE - 3+ VIEW COMPARISON:  None. FINDINGS: There is no evidence of fracture or dislocation. Hammertoe deformity of the third through fifth toes. There is no evidence of arthropathy or other focal bone abnormality. No soft tissue air or radiopaque foreign body. Soft tissues are unremarkable. IMPRESSION: No acute osseous abnormality or radiopaque foreign body. Site of clinical laceration is not well-defined  radiographically. Electronically Signed   By: Rubye Oaks M.D.   On: 09/20/2017 05:10    Procedures .Marland KitchenLaceration Repair Date/Time: 09/20/2017 6:18 AM Performed by: Cy Blamer, MD Authorized by: Cy Blamer, MD   Consent:    Consent obtained:  Verbal   Consent given by:  Patient   Risks discussed:  Infection, need for additional repair, nerve damage, poor wound healing, poor cosmetic result, pain, retained foreign body, tendon damage and vascular damage   Alternatives discussed:  No treatment Anesthesia (see MAR for exact dosages):    Anesthesia method:  Topical application and local infiltration   Topical anesthetic:  LET   Local anesthetic:  Lidocaine 1% WITH epi Laceration details:    Location:  Foot   Foot location:  R heel   Length (cm):  3   Depth (mm):  1 Repair type:    Repair type:  Simple Pre-procedure details:    Preparation:  Patient was prepped and draped in usual sterile fashion Exploration:    Hemostasis achieved with:  Direct pressure   Wound extent: no areolar tissue violation noted, no foreign bodies/material noted, no nerve damage noted, no tendon damage noted and no underlying fracture noted   Treatment:    Area cleansed with:  Betadine, saline and Shur-Clens   Amount of cleaning:  Extensive   Irrigation solution:  Sterile saline Skin repair:    Repair method:  Sutures   Suture size:  4-0   Suture material:  Nylon   Suture technique:  Simple interrupted   Number of sutures:  4 Approximation:    Approximation:  Close Post-procedure details:    Dressing:  Sterile dressing and bulky dressing   Patient tolerance of procedure:  Tolerated well, no immediate complications Comments:     Sterile dressing then kerlex then hospital sock (clean from package) then post op shoe.  Have advised only new, clean, white socks   (including critical care time)  Medications Ordered in ED Medications  ciprofloxacin (CIPRO) tablet 500 mg (500 mg Oral Given  09/20/17 0529)  lidocaine-EPINEPHrine-tetracaine (LET) solution (3 mLs Topical Given 09/20/17 0530)  lidocaine-EPINEPHrine (XYLOCAINE W/EPI) 2 %-1:200000 (PF) injection 10 mL (10 mLs Intradermal Given by Other 09/20/17 0530)    S  Final Clinical Impressions(s) / ED Diagnoses   Final diagnoses:  Laceration of heel without complication, right, initial encounter   Given the fact that this laceration went through the rubber of a tennis shoe that is old and a sock will cover with cipro PO BID.  Will need a wound check in 2 days with occupational medicine of the patient's employer.  Wound care instructions given verbally and in written form.    Return for pain, numbness, changes in vision or speech, fevers >100.4 unrelieved by medication, shortness of breath, intractable vomiting, or diarrhea, abdominal pain, Inability to tolerate liquids or  food, cough, altered mental status or any concerns. No signs of systemic illness or infection. The patient is nontoxic-appearing on exam and vital signs are within normal limits. Will refer to urology for microscopy hematuria as patient is asymptomatic.  I have reviewed the triage vital signs and the nursing notes. Pertinent labs &imaging results that were available during my care of the patient were reviewed by me and considered in my medical decision making (see chart for details).  After history, exam, and medical workup I feel the patient has been appropriately medically screened and is safe for discharge home. Pertinent diagnoses were discussed with the patient. Patient was given return precautions.  ED Discharge Orders        Ordered    ciprofloxacin (CIPRO) 500 MG tablet  2 times daily     09/20/17 0607       Sani Loiseau, MD 09/20/17 4010

## 2018-03-14 ENCOUNTER — Other Ambulatory Visit: Payer: Self-pay | Admitting: Family Medicine

## 2018-03-14 ENCOUNTER — Other Ambulatory Visit (HOSPITAL_COMMUNITY): Payer: Self-pay | Admitting: Family Medicine

## 2018-03-14 DIAGNOSIS — E6609 Other obesity due to excess calories: Secondary | ICD-10-CM | POA: Diagnosis not present

## 2018-03-14 DIAGNOSIS — R519 Headache, unspecified: Secondary | ICD-10-CM

## 2018-03-14 DIAGNOSIS — G43909 Migraine, unspecified, not intractable, without status migrainosus: Secondary | ICD-10-CM | POA: Diagnosis not present

## 2018-03-14 DIAGNOSIS — Z0001 Encounter for general adult medical examination with abnormal findings: Secondary | ICD-10-CM | POA: Diagnosis not present

## 2018-03-14 DIAGNOSIS — Z6834 Body mass index (BMI) 34.0-34.9, adult: Secondary | ICD-10-CM | POA: Diagnosis not present

## 2018-03-14 DIAGNOSIS — R51 Headache: Principal | ICD-10-CM

## 2018-03-19 ENCOUNTER — Ambulatory Visit (HOSPITAL_COMMUNITY)
Admission: RE | Admit: 2018-03-19 | Discharge: 2018-03-19 | Disposition: A | Discharge status: Home / Self Care | Payer: 59 | Source: Ambulatory Visit | Attending: Family Medicine | Admitting: Family Medicine

## 2018-03-19 DIAGNOSIS — R51 Headache: Secondary | ICD-10-CM | POA: Diagnosis not present

## 2018-03-19 DIAGNOSIS — G43909 Migraine, unspecified, not intractable, without status migrainosus: Secondary | ICD-10-CM | POA: Diagnosis not present

## 2018-03-19 DIAGNOSIS — R519 Headache, unspecified: Secondary | ICD-10-CM

## 2018-03-20 ENCOUNTER — Encounter: Payer: Self-pay | Admitting: Neurology

## 2018-04-16 NOTE — Progress Notes (Signed)
NEUROLOGY CONSULTATION NOTE  Samuel BobJustin Din MRN: [id number] DOB: 07-21-1990  Referring provider: Donzetta Sprungerry Daniel, MD Primary care provider: Donzetta Sprungerry Daniel, MD  Reason for consult:  Migraines, slurred speech, dizziness, cerebellar atrophy  HISTORY OF PRESENT ILLNESS: Samuel Stanley is a 28 year old  man who presents for migraines, dizziness and cerebellar atrophy.  He is accompanied by his wife who supplements history.  He has history of migraines associated with dizziness since he was 28 years old.  In June, his family noticed he would sometimes start shaking in the hands.  In August, he began having having fairly sudden onset of worsening dizziness, migraines and difficulty with balance.  It is a severe pounding bifrontal headache.  In addition to the dizziness, he has associated nausea, photophobia but no vomiting, phonophobia, visual disturbance or unilateral numbness and weakness.  They may last up to 3 hours (1 hour if treated with Excedrin Migraine).  In August, they were frequent (almost daily) and they resolved in late September.  He had another one last week.  No specific triggers.   He has had problems with equilibrium off and on for several years.  During same period of time, he had increased problems with equilibrium with difficulty walking but no falls.  In September, while in [place], [facility]  He states he was walking up on all fours because the steps were "too narrow".  Around the same time, he has had speech disturbance.  During conversation, he may have difficulty getting words out.  While it started around the same time as the other symptoms, this still occurs.  No associated double vision, difficulty swallowing, hearing loss or unilateral numbness or weakness.  Sometimes during a conversation, he will suddenly talk about something else that happened several years ago.  He had an MRI of the brain without contrast on 03/19/18 which was  personally reviewed and demonstrated severe cerebellar atrophy.  Since August 2018, he has had a 70 lb weight loss, not intentional.    When he was 28 years old, his biological mother left, so he doesn't know much about his mother's side of the family.  He was raised by his paternal grandparents.    No history of seizures and has no history of Dilantin use.  He denies history of alcohol abuse.    PAST MEDICAL HISTORY: Past Medical History:  Diagnosis Date  . Hypertension     PAST SURGICAL HISTORY: Past Surgical History:  Procedure Laterality Date  . HERNIA REPAIR    . testicular torsion      MEDICATIONS: Current Outpatient Medications on File Prior to Visit  Medication Sig Dispense Refill  . ciprofloxacin (CIPRO) 500 MG tablet Take 1 tablet (500 mg total) by mouth 2 (two) times daily. One po bid x 7 days 14 tablet 0   No current facility-administered medications on file prior to visit.     ALLERGIES: No Known Allergies  FAMILY HISTORY: No known family history  SOCIAL HISTORY: Social History   Socioeconomic History  . Marital status: Married    Spouse name: Not on file  . Number of children: Not on file  . Years of education: Not on file  . Highest education level: Not on file  Occupational History  . Not on file  Social Needs  . Financial resource strain: Not on file  . Food insecurity:    Worry: Not on file    Inability: Not on  file  . Transportation needs:    Medical: Not on file    Non-medical: Not on file  Tobacco Use  . Smoking status: Never Smoker  . Smokeless tobacco: Never Used  Substance and Sexual Activity  . Alcohol use: No  . Drug use: No  . Sexual activity: Not on file  Lifestyle  . Physical activity:    Days per week: Not on file    Minutes per session: Not on file  . Stress: Not on file  Relationships  . Social connections:    Talks on phone: Not on file    Gets together: Not on file    Attends religious service: Not on file    Active  member of club or organization: Not on file    Attends meetings of clubs or organizations: Not on file    Relationship status: Not on file  . Intimate partner violence:    Fear of current or ex partner: Not on file    Emotionally abused: Not on file    Physically abused: Not on file    Forced sexual activity: Not on file  Other Topics Concern  . Not on file  Social History Narrative  . Not on file    REVIEW OF SYSTEMS: Constitutional: No fevers, chills, or sweats, no generalized fatigue, change in appetite Eyes: No visual changes, double vision, eye pain Ear, nose and throat: No hearing loss, ear pain, nasal congestion, sore throat Cardiovascular: No chest pain, palpitations Respiratory:  No shortness of breath at rest or with exertion, wheezes GastrointestinaI: No nausea, vomiting, diarrhea, abdominal pain, fecal incontinence Genitourinary:  No dysuria, urinary retention or frequency Musculoskeletal:  No neck pain, back pain Integumentary: No rash, pruritus, skin lesions Neurological: as above Psychiatric: No depression, insomnia, anxiety Endocrine: No palpitations, fatigue, diaphoresis, mood swings, change in appetite, change in weight, increased thirst Hematologic/Lymphatic:  No purpura, petechiae. Allergic/Immunologic: no itchy/runny eyes, nasal congestion, recent allergic reactions, rashes  PHYSICAL EXAM: Blood pressure 130/90, pulse 100, height 6' (1.829 m), weight 243 lb 6 oz (110.4 kg), SpO2 93 %. General: No acute distress.  Patient appears well-groomed.   Head:  Normocephalic/atraumatic Eyes:  fundi examined but not visualized Neck: supple, no paraspinal tenderness, full range of motion Back: No paraspinal tenderness Heart: regular rate and rhythm Lungs: Clear to auscultation bilaterally. Vascular: No carotid bruits. Neurological Exam: Mental status: alert and oriented to person, place, and time, recent and remote memory intact, fund of knowledge intact, attention  and concentration intact, speech fluent and not dysarthric, language intact. Cranial nerves: CN I: not tested CN II: pupils equal, round and reactive to light, visual fields intact CN III, IV, VI:  full range of motion, no nystagmus, no ptosis CN V: facial sensation intact CN VII: upper and lower face symmetric CN VIII: hearing intact CN IX, X: gag intact, uvula midline CN XI: sternocleidomastoid and trapezius muscles intact CN XII: tongue midline Bulk & Tone: normal, no fasciculations. Motor:  5/5 throughout  Sensation:  Pinprick and vibration sensation intact.   Deep Tendon Reflexes:  2+ throughout, toes downgoing.  Finger to nose testing:  Tremulous.  Without dysmetria.   Heel to shin:  Some mild dysmetria Gait:  Normal station and stride.  Able to turn, tandem walk unsteady. Romberg negative.  IMPRESSION: 1.  Cerebellar atrophy 2.  Acute/subacute onset of neurologic symptoms, including cognitive changes, tremor, dizziness, unsteady gait.  Given the fairly acute/subacute onset of symptoms, consider autoimmune vs paraneoplastic syndrome (endorses unintentional weight  loss).  Other consideration includes spinocerebellar ataxia (maternal family history unknown).  He does not have history of alcohol abuse or has been on long-term medication use such as phenytoin. 3.  Migraine without aura, without status migrainosus, not intractable  PLAN: 1.  We will repeat MRI of brain WITH contrast to evaluate for any abnormal enhancement to help establish a diagnosis. 2.  We will check serum for B1, B12, and thyroid panel 3.  Following MRI, we will likely perform lumbar puncture for CSF analysis to check for inflammatory and paraneoplastic markers. 4.  Follow up after testing.  Thank you for allowing me to take part in the care of this patient.  Shon Millet, DO  CC: Donzetta Sprung, MD

## 2018-04-18 ENCOUNTER — Ambulatory Visit (INDEPENDENT_AMBULATORY_CARE_PROVIDER_SITE_OTHER): Payer: Commercial Managed Care - PPO | Admitting: Neurology

## 2018-04-18 ENCOUNTER — Encounter: Payer: Self-pay | Admitting: Neurology

## 2018-04-18 ENCOUNTER — Other Ambulatory Visit (INDEPENDENT_AMBULATORY_CARE_PROVIDER_SITE_OTHER): Payer: Commercial Managed Care - PPO

## 2018-04-18 VITALS — BP 130/90 | HR 100 | Ht 72.0 in | Wt 243.4 lb

## 2018-04-18 DIAGNOSIS — G319 Degenerative disease of nervous system, unspecified: Secondary | ICD-10-CM | POA: Diagnosis not present

## 2018-04-18 DIAGNOSIS — R410 Disorientation, unspecified: Secondary | ICD-10-CM | POA: Diagnosis not present

## 2018-04-18 DIAGNOSIS — R6889 Other general symptoms and signs: Secondary | ICD-10-CM

## 2018-04-18 DIAGNOSIS — R270 Ataxia, unspecified: Secondary | ICD-10-CM | POA: Diagnosis not present

## 2018-04-18 DIAGNOSIS — G43009 Migraine without aura, not intractable, without status migrainosus: Secondary | ICD-10-CM

## 2018-04-18 LAB — T4, FREE: Free T4: 1.2 ng/dL (ref 0.60–1.60)

## 2018-04-18 LAB — T3, FREE: T3, Free: 3.5 pg/mL (ref 2.3–4.2)

## 2018-04-18 LAB — VITAMIN B12: Vitamin B-12: 268 pg/mL (ref 211–911)

## 2018-04-18 NOTE — Patient Instructions (Addendum)
1.  We will first check MRI of the brain WITH contrast 2.  We will also check blood work for vitamin B1, B12, and thyroid panel 3.  After MRI, will likely set you up for a spinal tap. 4.  Follow up after testing.  We have sent a referral to [facility] for your MRI and they will call you directly to schedule your appt. They are located at [address] [facility]. If you need to contact them directly please call [phone].

## 2018-04-19 LAB — THYROID PANEL WITH TSH
Free Thyroxine Index: 2.7 (ref 1.4–3.8)
T3 UPTAKE: 37 % — AB (ref 22–35)
T4, Total: 7.3 ug/dL (ref 4.9–10.5)
TSH: 1.23 mIU/L (ref 0.40–4.50)

## 2018-04-19 LAB — THYROID ANTIBODIES
Thyroglobulin Ab: <1 IU/mL (ref <=1)
Thyroperoxidase Ab SerPl-aCnc: <1 IU/mL (ref <9)

## 2018-04-21 LAB — VITAMIN B1: Vitamin B1 (Thiamine): 12 nmol/L (ref 8–30)

## 2018-04-24 ENCOUNTER — Telehealth: Payer: Self-pay | Admitting: *Deleted

## 2018-04-24 ENCOUNTER — Encounter: Payer: Self-pay | Admitting: *Deleted

## 2018-04-24 NOTE — Telephone Encounter (Signed)
-----   Message from Drema Dallas, DO sent at 04/22/2018  7:43 AM EST ----- Blood work is unremarkable

## 2018-04-24 NOTE — Telephone Encounter (Signed)
Results sent via My Chart.  

## 2018-05-02 ENCOUNTER — Ambulatory Visit
Admission: RE | Admit: 2018-05-02 | Discharge: 2018-05-02 | Disposition: A | Discharge status: Home / Self Care | Payer: 59 | Source: Ambulatory Visit | Attending: Neurology | Admitting: Neurology

## 2018-05-02 DIAGNOSIS — G319 Degenerative disease of nervous system, unspecified: Secondary | ICD-10-CM

## 2018-05-02 DIAGNOSIS — R42 Dizziness and giddiness: Secondary | ICD-10-CM | POA: Diagnosis not present

## 2018-05-02 DIAGNOSIS — R410 Disorientation, unspecified: Secondary | ICD-10-CM

## 2018-05-02 DIAGNOSIS — R270 Ataxia, unspecified: Secondary | ICD-10-CM

## 2018-05-02 DIAGNOSIS — R6889 Other general symptoms and signs: Secondary | ICD-10-CM

## 2018-05-02 MED ORDER — GADOBENATE DIMEGLUMINE 529 MG/ML IV SOLN
20.0000 mL | Freq: Once | INTRAVENOUS | Status: AC | PRN
  Administered 2018-05-02: 20 mL via INTRAVENOUS

## 2018-05-06 ENCOUNTER — Telehealth: Payer: Self-pay

## 2018-05-06 NOTE — Telephone Encounter (Signed)
-----   Message from Drema Dallas, DO sent at 05/03/2018  2:02 PM EST ----- MRI with contrast does not show any abnormalities to suggest an underlying inflammatory condition.  I would like Samuel Stanley to make a follow up appointment to discuss further.

## 2018-05-06 NOTE — Telephone Encounter (Signed)
Called and advised Pt 

## 2018-05-14 NOTE — Progress Notes (Signed)
NEUROLOGY FOLLOW UP OFFICE NOTE  Samuel Stanley [id number]  HISTORY OF PRESENT ILLNESS: Samuel Stanley is a 28 year old man who follows up to review MRI.  He is accompanied by his wife.  UPDATE:  He had an MRI of the brain with contrast on 05/02/2018 which was personally reviewed and demonstrated no abnormal contrast enhancement.  B1 and thyroid panel unremarkable.  B12 level low normal range of 268.  Clinically, no change since last month.  HISTORY: He has history of migraines associated with dizziness since he was 28 years old.  In June, his family noticed he would sometimes start shaking in the hands.  In August, he began having having fairly sudden onset of worsening dizziness, migraines and difficulty with balance.  It is a severe pounding bifrontal headache.  In addition to the dizziness, he has associated nausea, photophobia but no vomiting, phonophobia, visual disturbance or unilateral numbness and weakness.  They may last up to 3 hours (1 hour if treated with Excedrin Migraine).  In August, they were frequent (almost daily) and they resolved in late September.  He had another one last week.  No specific triggers.   He has had problems with equilibrium off and on for several years.  During same period of time, he had increased problems with equilibrium with difficulty walking but no falls.  In September, while in [place], he walked up the steps of the [facility] on all fours.  He states he was walking up on all fours because the steps were "too narrow".  Around the same time, he has had speech disturbance.  During conversation, he may have difficulty getting words out.  While it started around the same time as the other symptoms, this still occurs.  No associated double vision, difficulty swallowing, hearing loss or unilateral numbness or weakness.  Sometimes during a conversation, he will suddenly talk about something else that happened several years ago.  He had an MRI of the brain  without contrast on 03/19/18 which was personally reviewed and demonstrated severe cerebellar atrophy.  Since August 2018, he has had a 70 lb weight loss, not intentional.    When he was 28 years old, his biological mother left, so he doesn't know much about his mother's side of the family.  He was raised by his paternal grandparents.    No history of seizures and has no history of Dilantin use.  He denies history of alcohol abuse.   PAST MEDICAL HISTORY: Past Medical History:  Diagnosis Date  . Hypertension     MEDICATIONS: No outpatient encounter medications on file as of 05/15/2018.   No facility-administered encounter medications on file as of 05/15/2018.     ALLERGIES: No known allergies  FAMILY HISTORY: No known family history  SOCIAL HISTORY: Social History   Socioeconomic History  . Marital status: Married    Spouse name: Not on file  . Number of children: 0  . Years of education: 75  . Highest education level: High school graduate  Occupational History  . Not on file  Social Needs  . Financial resource strain: Not on file  . Food insecurity:    Worry: Not on file    Inability: Not on file  . Transportation needs:    Medical: Not on file    Non-medical: Not on file  Tobacco Use  . Smoking status: Never Smoker  . Smokeless tobacco: Never Used  Substance and Sexual Activity  . Alcohol use: No  . Drug use:  No  . Sexual activity: Not on file  Lifestyle  . Physical activity:    Days per week: Not on file    Minutes per session: Not on file  . Stress: Not on file  Relationships  . Social connections:    Talks on phone: Not on file    Gets together: Not on file    Attends religious service: Not on file    Active member of club or organization: Not on file    Attends meetings of clubs or organizations: Not on file    Relationship status: Not on file  . Intimate partner violence:    Fear of current or ex partner: Not on file    Emotionally abused: Not on  file    Physically abused: Not on file    Forced sexual activity: Not on file  Other Topics Concern  . Not on file  Social History Narrative   Lives with wife in a one story home.  No children.  Getting ready to start a new job.  Education: high school.     REVIEW OF SYSTEMS: Constitutional: No fevers, chills, or sweats, no generalized fatigue, change in appetite Eyes: No visual changes, double vision, eye pain Ear, nose and throat: No hearing loss, ear pain, nasal congestion, sore throat Cardiovascular: No chest pain, palpitations Respiratory:  No shortness of breath at rest or with exertion, wheezes GastrointestinaI: No nausea, vomiting, diarrhea, abdominal pain, fecal incontinence Genitourinary:  No dysuria, urinary retention or frequency Musculoskeletal:  No neck pain, back pain Integumentary: No rash, pruritus, skin lesions Neurological: as above Psychiatric: No depression, insomnia, anxiety Endocrine: No palpitations, fatigue, diaphoresis, mood swings, change in appetite, change in weight, increased thirst Hematologic/Lymphatic:  No purpura, petechiae. Allergic/Immunologic: no itchy/runny eyes, nasal congestion, recent allergic reactions, rashes  PHYSICAL EXAM: Blood pressure 122/74, pulse 80, height 6' (1.829 m), weight 251 lb (113.9 kg), SpO2 96 %. General: No acute distress.  Patient appears well-groomed.   Head:  Normocephalic/atraumatic Eyes:  Fundi examined but not visualized Neck: supple, no paraspinal tenderness, full range of motion Heart:  Regular rate and rhythm Lungs:  Clear to auscultation bilaterally Back: No paraspinal tenderness Neurological Exam: alert and oriented to person, place, and time. Attention span and concentration intact, recent and remote memory intact, fund of knowledge intact.  Speech fluent and not dysarthric, language intact.  CN II-XII intact. Bulk and tone normal, muscle strength 5/5 throughout.  Sensation to light touch, temperature and  vibration intact.  Deep tendon reflexes 2+ throughout, toes downgoing.  Finger to nose testing reveals mild tremulousness.  Heel to shin with mild dysmetria.  Gait mildly unsteady.  Unsteady tandem walk. Romberg negative.  IMPRESSION: 1.  Subacute onset of multiple symptomatology (including dizziness, tremor, ataxia, headache, cognitive changes) in setting of cerebellar atrophy.  Given the subacute onset of these various symptoms, I would like to rule out a viral or inflammatory process.    PLAN: 1.  Check MRI of cervical spine with and without contrast to evaluate for any evidence of demyelinating disease (such as NMO) 2.  Lumbar puncture for CSF analysis checking cell count with diff, protein, glucose, IgG index, oligoclonal bands, gram stain and culture, cytology, HSV, Lyme, EBV, WNV, and paraneoplastic panel. 3.  If testing unremarkable, will likely recommend genetic testing for spinocerebellar ataxia.   4. B12 level is borderline normal, therefore will have him start B12 1000 mcg daily 5.  Follow up after testing.  21 minutes spent face to face  with patient, over 50% spent discussing MRI/lab results, differential diagnosis, and plan.  Shon Millet, DO  CC: Donzetta Sprung, MD

## 2018-05-15 ENCOUNTER — Ambulatory Visit (INDEPENDENT_AMBULATORY_CARE_PROVIDER_SITE_OTHER): Payer: 59 | Admitting: Neurology

## 2018-05-15 ENCOUNTER — Encounter: Payer: Self-pay | Admitting: Neurology

## 2018-05-15 VITALS — BP 122/74 | HR 80 | Ht 72.0 in | Wt 251.0 lb

## 2018-05-15 DIAGNOSIS — R270 Ataxia, unspecified: Secondary | ICD-10-CM

## 2018-05-15 DIAGNOSIS — G319 Degenerative disease of nervous system, unspecified: Secondary | ICD-10-CM | POA: Diagnosis not present

## 2018-05-15 DIAGNOSIS — R6889 Other general symptoms and signs: Secondary | ICD-10-CM | POA: Diagnosis not present

## 2018-05-15 NOTE — Patient Instructions (Addendum)
1.  We will check MRI of cervical spine with and without contrast to evaluate for any evidence of demyelinating disease 2.  We will then schedule lumbar puncture to evaluate cerebrospinal fluid for cell count with diff, protein, glucose, IgG index, oligoclonal bands, gram stain and culture, herpes simplex virus, Lyme, Epstein Barr virus, West Nile virus, and paraneoplastic panel. 3. Start B12 1000 mcg daily 4.  Further recommendations pending results.  Follow up after testing.

## 2018-05-19 ENCOUNTER — Encounter (HOSPITAL_COMMUNITY): Payer: Self-pay | Admitting: *Deleted

## 2018-05-19 ENCOUNTER — Ambulatory Visit (HOSPITAL_COMMUNITY)
Admission: EM | Admit: 2018-05-19 | Discharge: 2018-05-19 | Disposition: A | Discharge status: Home / Self Care | Payer: 59 | Attending: Family Medicine | Admitting: Family Medicine

## 2018-05-19 ENCOUNTER — Other Ambulatory Visit: Payer: Self-pay

## 2018-05-19 DIAGNOSIS — R112 Nausea with vomiting, unspecified: Secondary | ICD-10-CM | POA: Diagnosis not present

## 2018-05-19 DIAGNOSIS — R197 Diarrhea, unspecified: Secondary | ICD-10-CM | POA: Diagnosis not present

## 2018-05-19 MED ORDER — ONDANSETRON 4 MG PO TBDP: 4.0000 mg | ORAL_TABLET | Freq: Three times a day (TID) | ORAL | 0 refills | Status: DC | PRN

## 2018-05-19 NOTE — ED Provider Notes (Signed)
[facility]    CSN: [id number] Arrival date & time: 05/19/18  1037     History   Chief Complaint Chief Complaint  Patient presents with  . Emesis  . Diarrhea    HPI Samuel Stanley is a 28 y.o. male history of hypertension presenting today for evaluation of nausea vomiting and diarrhea.  Patient states that symptoms began yesterday.  He has not been able to keep any solid foods down.  He has been able to tolerate ginger ale today.  Has also had associated diarrhea.  Denies blood in the vomit or stool.  Denies any associated abdominal pain.  Denies any previous abdominal surgeries.  Has had some mild rhinorrhea, but this is been going on prior to onset of symptoms.  His wife is having similar symptoms as well.  Denies any fevers.  He has not taken anything over-the-counter for symptoms.  Has started to feel slightly lightheaded.  HPI  Past Medical History:  Diagnosis Date  . Hypertension     Patient Active Problem List   Diagnosis Date Noted  . Anterior dislocation of right shoulder 03/01/2016    Past Surgical History:  Procedure Laterality Date  . HERNIA REPAIR    . testicular torsion         Home Medications    Prior to Admission medications   Medication Sig Start Date End Date Taking? Authorizing Provider  ondansetron (ZOFRAN ODT) 4 MG disintegrating tablet Take 1 tablet (4 mg total) by mouth every 8 (eight) hours as needed for nausea or vomiting. 05/19/18   Allyse Fregeau, Junius Creamer, PA-C    Family History Family History  Problem Relation Age of Onset  . Healthy Mother   . Healthy Father     Social History Social History   Tobacco Use  . Smoking status: Never Smoker  . Smokeless tobacco: Current User    Types: Chew  Substance Use Topics  . Alcohol use: No  . Drug use: No     Allergies   Patient has no known allergies.   Review of Systems Review of Systems  Constitutional: Negative for activity change, appetite change, chills, fatigue and fever.   HENT: Positive for rhinorrhea. Negative for congestion, ear pain, sinus pressure, sore throat and trouble swallowing.   Eyes: Negative for discharge and redness.  Respiratory: Negative for cough, chest tightness and shortness of breath.   Cardiovascular: Negative for chest pain.  Gastrointestinal: Positive for diarrhea, nausea and vomiting. Negative for abdominal pain.  Musculoskeletal: Negative for myalgias.  Skin: Negative for rash.  Neurological: Negative for dizziness, light-headedness and headaches.     Physical Exam Triage Vital Signs ED Triage Vitals  Enc Vitals Group     BP 05/19/18 1052 124/77     Pulse Rate 05/19/18 1051 95     Resp 05/19/18 1051 18     Temp 05/19/18 1051 98.2 F (36.8 C)     Temp Source 05/19/18 1051 Temporal     SpO2 05/19/18 1051 99 %     Weight --      Height --      Head Circumference --      Peak Flow --      Pain Score 05/19/18 1052 0     Pain Loc --      Pain Edu? --      Excl. in GC? --    No data found.  Updated Vital Signs BP 124/77   Pulse 95   Temp 98.2 F (36.8  C) (Temporal)   Resp 18   SpO2 99%   Visual Acuity Right Eye Distance:   Left Eye Distance:   Bilateral Distance:    Right Eye Near:   Left Eye Near:    Bilateral Near:     Physical Exam Vitals signs and nursing note reviewed.  Constitutional:      Appearance: He is well-developed.  HENT:     Head: Normocephalic and atraumatic.     Mouth/Throat:     Comments: Oral mucosa pink and moist, no tonsillar enlargement or exudate. Posterior pharynx patent and nonerythematous, no uvula deviation or swelling. Normal phonation. Eyes:     Conjunctiva/sclera: Conjunctivae normal.  Neck:     Musculoskeletal: Neck supple.  Cardiovascular:     Rate and Rhythm: Normal rate and regular rhythm.     Heart sounds: No murmur.  Pulmonary:     Effort: Pulmonary effort is normal. No respiratory distress.     Breath sounds: Normal breath sounds.     Comments: Breathing  comfortably at rest, CTABL, no wheezing, rales or other adventitious sounds auscultated Abdominal:     Palpations: Abdomen is soft.     Tenderness: There is no abdominal tenderness.     Comments: Abdomen soft, nondistended, striae present across abdomen, nontender to light and deep palpation throughout abdomen  Skin:    General: Skin is warm and dry.  Neurological:     Mental Status: He is alert.      UC Treatments / Results  Labs (all labs ordered are listed, but only abnormal results are displayed) Labs Reviewed - No data to display  EKG None  Radiology No results found.  Procedures Procedures (including critical care time)  Medications Ordered in UC Medications - No data to display  Initial Impression / Assessment and Plan / UC Course  I have reviewed the triage vital signs and the nursing notes.  Pertinent labs & imaging results that were available during my care of the patient were reviewed by me and considered in my medical decision making (see chart for details).     Acute onset of nausea vomiting and diarrhea, vital signs stable, most likely viral gastroenteritis, do not suspect underlying abdominal emergency at this time, nontender.  Will treat symptomatically and supportively, Zofran prescribed.  Push fluids for oral rehydration.  Continue to monitor,Discussed strict return precautions. Patient verbalized understanding and is agreeable with plan.  Final Clinical Impressions(s) / UC Diagnoses   Final diagnoses:  Nausea vomiting and diarrhea     Discharge Instructions     Your nausea, vomiting, and diarrhea appear to have a viral cause. Your symptoms should improve over the next week as your body continues to rid the infectious cause.  For nausea: Zofran prescribed. Begin with every 6 hours, than as you are able to hold food down, take it as needed. Start with clear liquids, then move to plain foods like bananas, rice, applesauce, toast, broth, grits,  oatmeal. As those food settle okay you may transition to your normal foods. Avoid spicy and greasy foods as much as possible.  For Diarrhea: This is your body's natural way of getting rid of a virus. You may try taking 1 imodium to decrease amount of stools a day, but we do not want you to stop your diarrhea.   Preventing dehydration is key! You need to replace the fluid your body is expelling. Drink plenty of fluids, may use Pedialyte or sports drinks.   Please return if you are  experiencing blood in your vomit or stool or experiencing dizziness, lightheadedness, extreme fatigue, increased abdominal pain.    ED Prescriptions    Medication Sig Dispense Auth. Provider   ondansetron (ZOFRAN ODT) 4 MG disintegrating tablet Take 1 tablet (4 mg total) by mouth every 8 (eight) hours as needed for nausea or vomiting. 20 tablet Adya Wirz, Rudyard C, PA-C     Controlled Substance Prescriptions [place] Controlled Substance Registry consulted? Not Applicable   Turhan, [place], [place] 05/19/18 1122

## 2018-05-19 NOTE — ED Triage Notes (Signed)
Reports starting with vomiting and diarrhea with body aches since yesterday without fevers.  Able to keep down PO fluids.

## 2018-05-19 NOTE — ED Notes (Signed)
Pt discharged by provider.

## 2018-05-19 NOTE — Discharge Instructions (Addendum)

## 2018-05-25 ENCOUNTER — Ambulatory Visit
Admission: RE | Admit: 2018-05-25 | Discharge: 2018-05-25 | Disposition: A | Discharge status: Home / Self Care | Payer: 59 | Source: Ambulatory Visit | Attending: Neurology | Admitting: Neurology

## 2018-05-25 ENCOUNTER — Other Ambulatory Visit: Payer: Self-pay

## 2018-05-25 DIAGNOSIS — R6889 Other general symptoms and signs: Secondary | ICD-10-CM

## 2018-05-25 DIAGNOSIS — M50222 Other cervical disc displacement at C5-C6 level: Secondary | ICD-10-CM | POA: Diagnosis not present

## 2018-05-25 MED ORDER — GADOBENATE DIMEGLUMINE 529 MG/ML IV SOLN
20.0000 mL | Freq: Once | INTRAVENOUS | Status: AC | PRN
  Administered 2018-05-25: 20 mL via INTRAVENOUS

## 2018-05-28 ENCOUNTER — Telehealth: Payer: Self-pay | Admitting: *Deleted

## 2018-05-28 ENCOUNTER — Encounter: Payer: Self-pay | Admitting: *Deleted

## 2018-05-28 NOTE — Telephone Encounter (Signed)
-----   Message from Drema Dallas, DO sent at 05/27/2018  7:39 AM EDT ----- MRI of cervical spinal cord is unremarkable.  Proceed with lumbar puncture as planned.

## 2018-05-28 NOTE — Telephone Encounter (Signed)
Results and instructions sent via My Chart.  

## 2018-05-30 ENCOUNTER — Other Ambulatory Visit: Payer: Self-pay

## 2018-06-03 ENCOUNTER — Other Ambulatory Visit: Payer: Self-pay

## 2018-07-05 ENCOUNTER — Other Ambulatory Visit: Payer: Self-pay

## 2018-07-30 ENCOUNTER — Telehealth: Payer: Self-pay | Admitting: Neurology

## 2018-07-30 NOTE — Telephone Encounter (Signed)
Wife left VM about patient having dizziness today and wanting to talk to the nurse. Please call her back at [phone]. Thanks!

## 2018-07-31 NOTE — Telephone Encounter (Signed)
Called and spoke with Pt. He states the dizziness started on Monday. It is present when sitting or when standing. He denies any spinning sensation, he states it feels like a side to side sensation. There is no diarrhea associated with the dizziness. His LP is scheduled for this Friday.

## 2018-07-31 NOTE — Telephone Encounter (Signed)
Now the patient is calling in about the dizziness and wanting to know what to do . Please call his wife back or call him back at [phone]. Thanks!

## 2018-07-31 NOTE — Telephone Encounter (Signed)
Patient left VM about needing to speak with the nurse. Wasn't sure if this was before or after you called him back. Thanks!

## 2018-08-01 MED ORDER — MECLIZINE HCL 25 MG PO TABS: 25.0000 mg | ORAL_TABLET | Freq: Three times a day (TID) | ORAL | 1 refills | Status: DC | PRN

## 2018-08-01 NOTE — Telephone Encounter (Signed)
Spoke with Pt, advised of meclizine and dosing, updated pharmacy

## 2018-08-01 NOTE — Telephone Encounter (Signed)
He can take meclizine 25mg  three times daily as needed.

## 2018-08-02 ENCOUNTER — Ambulatory Visit
Admission: RE | Admit: 2018-08-02 | Discharge: 2018-08-02 | Disposition: A | Discharge status: Home / Self Care | Payer: Managed Care, Other (non HMO) | Source: Ambulatory Visit | Attending: Neurology | Admitting: Neurology

## 2018-08-02 ENCOUNTER — Other Ambulatory Visit: Payer: Self-pay

## 2018-08-02 ENCOUNTER — Other Ambulatory Visit (HOSPITAL_COMMUNITY)
Admission: RE | Admit: 2018-08-02 | Discharge: 2018-08-02 | Disposition: A | Discharge status: Home / Self Care | Payer: Managed Care, Other (non HMO) | Source: Ambulatory Visit | Attending: Neurology | Admitting: Neurology

## 2018-08-02 VITALS — BP 141/75 | HR 64

## 2018-08-02 DIAGNOSIS — R6889 Other general symptoms and signs: Secondary | ICD-10-CM | POA: Diagnosis present

## 2018-08-02 MED ORDER — DIAZEPAM 5 MG PO TABS
10.0000 mg | ORAL_TABLET | Freq: Once | ORAL | Status: AC
  Administered 2018-08-02: 07:00:00 10 mg via ORAL

## 2018-08-02 NOTE — Discharge Instructions (Signed)

## 2018-08-02 NOTE — Progress Notes (Signed)
One SST tube of blood drawn from right AC space for LP labs; site unremarkable. 

## 2018-08-06 ENCOUNTER — Telehealth: Payer: Self-pay | Admitting: Neurology

## 2018-08-06 NOTE — Telephone Encounter (Signed)
Message was left with the after hour answering service on 08-03-18 @ 1:36 pm    Caller Helmut Muster with [facility] has ciritical labs  Lab reference number is   [id number]  08-03-18 @ 1:36 pm called on call provider katherine fuller RN   08-03-18 @ 1:40 Pm Clinical called katherine fuller RN    Lurena Joiner Tat was reached as the on call provider at 1:36   Lab results given CSF culture collected 08-02-18 gram stain no organism or white blood cells seen gram stain prepared by cytospin  Glucose 59, total protein  42 colorless clear no RBC  No WBC received from [facility] with [facility]

## 2018-08-11 LAB — PROTEIN, CSF: Total Protein, CSF: 42 mg/dL (ref 15–45)

## 2018-08-11 LAB — BORRELIA SPECIES DNA, FLUID, PCR: B. burgdorferi DNA: NOT DETECTED

## 2018-08-11 LAB — CNS IGG SYNTHESIS RATE, CSF+BLOOD
Albumin Serum: 4.4 g/dL (ref 3.5–5.2)
Albumin, CSF: 22.4 mg/dL (ref 8.0–42.0)
CNS-IgG Synthesis Rate: -1 mg/24 h (ref <=3.3)
IgG (Immunoglobin G), Serum: 888 mg/dL (ref 600–1640)
IgG Total CSF: 2.5 mg/dL (ref 0.8–7.7)
IgG-Index: 0.55 (ref <0.66)

## 2018-08-11 LAB — PARANEOPLASTIC ANTIBODY EVALUATION WITH REFL TO TITR/LB/BASIC/CSF
AGNA/SOX1 Ab, IFA: NEGATIVE
ANNA1 (Hu) Ab, IFA: NEGATIVE
ANNA2 (Ri) Ab, IFA: NEGATIVE
ANNA3 Ab, IFA: NEGATIVE
AQUAPORIN 4 AB, IFA, CSF: NEGATIVE
Amphiphysin Ab, IFA: NEGATIVE
CRMP5/CV2 Ab, IFA: NEGATIVE
GAD65 AB, IFA, CSF: NEGATIVE
PCA TR (DNER) AB, IFA,CSF: NEGATIVE
PCA1 (Yo) Ab, IFA: NEGATIVE
PCA2 Ab, IFA: NEGATIVE

## 2018-08-11 LAB — CSF CELL COUNT WITH DIFFERENTIAL
RBC Count, CSF: 0 cells/uL (ref 0–10)
WBC, CSF: 0 cells/uL (ref 0–5)

## 2018-08-11 LAB — CSF CULTURE

## 2018-08-11 LAB — CSF CULTURE W GRAM STAIN
MICRO NUMBER:: 500055
Result:: NO GROWTH
SPECIMEN QUALITY:: ADEQUATE

## 2018-08-11 LAB — GLUCOSE, CSF: Glucose, CSF: 59 mg/dL (ref 40–80)

## 2018-08-11 LAB — OLIGOCLONAL BANDS, CSF + SERM

## 2018-08-11 LAB — WEST NILE ANTIBODIES, IGG AND IGM
West Nile Virus, IgG: <1.3 index (ref <1.30)
West Nile Virus, IgM: <0.9 index (ref <0.90)

## 2018-08-11 LAB — EPSTEIN BARR VIRUS DNA, QUANT RTPCR
EBV DNA, QN PCR: <2.3 Log cps/mL (ref <2.30)
EBV DNA, QN PCR: <200 copies/mL (ref <200)

## 2018-08-11 LAB — HSV DNA BY PCR (REFERENCE LAB)
HSV 1 DNA: NOT DETECTED
HSV 2 DNA: NOT DETECTED

## 2018-08-12 ENCOUNTER — Telehealth: Payer: Self-pay

## 2018-08-12 NOTE — Telephone Encounter (Signed)
Called and spoke with Pt's wife [name], advised her of Labs from LP. She will have Pt call to schedule virtual visit to discuss.

## 2018-08-12 NOTE — Telephone Encounter (Signed)
-----   Message from Drema Dallas, DO sent at 08/12/2018  8:48 AM EDT ----- Labs from spinal fluid are all unremarkable.  Advise to make follow up appointment to discuss further.  E-visit is okay.

## 2018-09-19 ENCOUNTER — Telehealth: Payer: Self-pay | Admitting: *Deleted

## 2018-09-19 ENCOUNTER — Encounter (HOSPITAL_COMMUNITY): Payer: Self-pay | Admitting: Emergency Medicine

## 2018-09-19 ENCOUNTER — Ambulatory Visit (HOSPITAL_COMMUNITY)
Admission: EM | Admit: 2018-09-19 | Discharge: 2018-09-19 | Disposition: A | Discharge status: Home / Self Care | Payer: Managed Care, Other (non HMO)

## 2018-09-19 ENCOUNTER — Other Ambulatory Visit: Payer: Self-pay

## 2018-09-19 DIAGNOSIS — R509 Fever, unspecified: Secondary | ICD-10-CM | POA: Diagnosis not present

## 2018-09-19 DIAGNOSIS — Z20822 Contact with and (suspected) exposure to covid-19: Secondary | ICD-10-CM

## 2018-09-19 NOTE — Telephone Encounter (Signed)
-----   Message from Jaynee Eagles, [place] sent at 09/19/2018 12:12 PM EDT ----- Regarding: Needs COVID testing

## 2018-09-19 NOTE — Telephone Encounter (Signed)
Scheduled patient for COVID 19 test 09/20/2018 at [facility].  Testing protocol reviewed with patient.

## 2018-09-19 NOTE — ED Provider Notes (Signed)
  MRN: [id number] DOB: [birth year]  Subjective:   Samuel Stanley is a 28 y.o. male presenting for a checkup after having a fever on screening at work.  Works at a [employer], handles a saw.  Denies coming into contact with anyone confirmed to have COVID-19.  He has not tried any medications for relief.  Denies taking any chronic medications.   No Known Allergies  Past Medical History:  Diagnosis Date  . Hypertension      Past Surgical History:  Procedure Laterality Date  . HERNIA REPAIR    . testicular torsion      Review of Systems  Constitutional: Positive for fever. Negative for malaise/fatigue.  HENT: Negative for congestion, ear pain, sinus pain and sore throat.   Eyes: Negative for blurred vision, double vision, discharge and redness.  Respiratory: Negative for cough, hemoptysis, shortness of breath and wheezing.   Cardiovascular: Negative for chest pain.  Gastrointestinal: Negative for abdominal pain, diarrhea, nausea and vomiting.  Genitourinary: Negative for dysuria, flank pain and hematuria.  Musculoskeletal: Negative for myalgias.  Skin: Negative for rash.  Neurological: Negative for dizziness, weakness and headaches.  Psychiatric/Behavioral: Negative for depression and substance abuse.    Objective:   Vitals: BP (!) 145/96   Pulse 73   Temp 97.8 F (36.6 C)   Resp 16   SpO2 97%   Physical Exam Constitutional:      General: He is not in acute distress.    Appearance: Normal appearance. He is well-developed. He is not ill-appearing, toxic-appearing or diaphoretic.  HENT:     Head: Normocephalic and atraumatic.     Right Ear: External ear normal.     Left Ear: External ear normal.     Nose: Nose normal.     Mouth/Throat:     Mouth: Mucous membranes are moist.     Pharynx: Oropharynx is clear.  Eyes:     General: No scleral icterus.    Extraocular Movements: Extraocular movements intact.     Pupils: Pupils are equal, round, and reactive to  light.  Cardiovascular:     Rate and Rhythm: Normal rate and regular rhythm.     Heart sounds: Normal heart sounds. No murmur. No friction rub. No gallop.   Pulmonary:     Effort: Pulmonary effort is normal. No respiratory distress.     Breath sounds: Normal breath sounds. No stridor. No wheezing, rhonchi or rales.  Neurological:     Mental Status: He is alert and oriented to person, place, and time.  Psychiatric:        Mood and Affect: Mood normal.        Behavior: Behavior normal.        Thought Content: Thought content normal.     Assessment and Plan :   1. Fever, unspecified     Counseled patient on nature of COVID-19 including modes of transmission, diagnostic testing, management and supportive care. Advised supportive care, offered symptomatic relief. Counseled patient on potential for adverse effects with medications prescribed/recommended today, ER and return-to-clinic precautions discussed, patient verbalized understanding.     Jaynee Eagles, [place] 09/19/18 1221

## 2018-09-19 NOTE — Discharge Instructions (Signed)

## 2018-09-19 NOTE — ED Triage Notes (Signed)
Pt states he went to work today and they checked his temp with the forehead thermometer and it read 100, pt was sent here for further evaluation. Pt denies respiratory symptoms. Denies pain.

## 2018-09-20 ENCOUNTER — Other Ambulatory Visit: Payer: Managed Care, Other (non HMO)

## 2018-09-20 DIAGNOSIS — Z20822 Contact with and (suspected) exposure to covid-19: Secondary | ICD-10-CM

## 2018-09-20 DIAGNOSIS — R6889 Other general symptoms and signs: Secondary | ICD-10-CM | POA: Diagnosis not present

## 2018-09-26 LAB — NOVEL CORONAVIRUS, NAA: SARS-CoV-2, NAA: NOT DETECTED

## 2018-09-30 ENCOUNTER — Encounter (HOSPITAL_COMMUNITY): Payer: Self-pay

## 2018-11-25 ENCOUNTER — Telehealth: Payer: Self-pay | Admitting: Neurology

## 2018-11-25 NOTE — Telephone Encounter (Signed)
He needs to make a follow up appointment.

## 2018-11-25 NOTE — Telephone Encounter (Signed)
No letter until patient is seen in clinic/virtual

## 2018-11-25 NOTE — Telephone Encounter (Signed)
Patient's wife, [name], called back and she is now aware her husband needs to be seen in the clinic or virtually first.

## 2018-11-25 NOTE — Telephone Encounter (Signed)
Patient is scheduled for a virtual visit on 11/27/2018 at 11:10 AM. His wife stopped by to pick up the letter today thinking it was ready. She said her husband needs the note in order to return to work tomorrow.

## 2018-11-25 NOTE — Telephone Encounter (Signed)
Noted  

## 2018-11-25 NOTE — Telephone Encounter (Signed)
Pt last seen 05/15/18 are you ok with witting work note for this patient?

## 2018-11-25 NOTE — Telephone Encounter (Signed)
Tried calling Samuel Stanley no answer unable to leave message mail box full

## 2018-11-25 NOTE — Progress Notes (Signed)
Virtual Visit via Video Note The purpose of this virtual visit is to provide medical care while limiting exposure to the novel coronavirus.    Consent was obtained for video visit:  Yes Answered questions that patient had about telehealth interaction:  Yes I discussed the limitations, risks, security and privacy concerns of performing an evaluation and management service by telemedicine. I also discussed with the patient that there may be a patient responsible charge related to this service. The patient expressed understanding and agreed to proceed.  Pt location: Home Physician Location: Office Name of referring provider:  Richardean Chimeraaniel, Terry G, MD I connected with Samuel Stanley at patients initiation/request on 11/27/2018 at 11:10 AM EDT by video enabled telemedicine application and verified that I am speaking with the correct person using two identifiers. Pt MRN:  [id number] Pt DOB:  06-30-1990 Video Participants:  Samuel Stanley   History of Present Illness:  Samuel Stanley is a 28 year old man who follows up to review MRI.  He is accompanied by his wife.  UPDATE: He underwent further workup: MRI of cervical spine with and without contrast on 05/25/18 was unremarkable. He underwent lumbar puncture on 08/02/18.  CSF analysis demonstrated cell count 0, protein 42, no oligoclonal bands, IgG index <0.66, paraneoplastic panel negative, cytology negative, gram stain and culture negative, Lyme negative, EBV negative, West Nile Virus negative, HSV 1/2 negative.  He had an episode of vertigo on Monday, but that was the first time in awhile.  He took his meclizine which helped.  However, his work has not let him come back until he receives a note from work stating that his dizziness is not related to COVID.  He was tested for COVID in July, after presenting to the ED with fever, which was negative.  He has not had any recent fevers or cold-like symptoms.  HISTORY: He has history of migraines associated with  dizziness since he was 28 years old. In June, his family noticed he would sometimes start shaking in the hands. In August, he began having having fairly sudden onset of worsening dizziness, migraines and difficulty with balance. It is a severe pounding bifrontal headache. In addition to the dizziness, he has associated nausea, photophobia but no vomiting, phonophobia, visual disturbance or unilateral numbness and weakness. They may last up to 3 hours (1 hour if treated with Excedrin Migraine). In August, they were frequent (almost daily) and they resolved in late September. He had another one last week. No specific triggers. He has had problems with equilibrium off and on for several years. During same period of time, he had increased problems with equilibrium with difficulty walking but no falls. In September, while in [place], [place] [facility] [place]. He states he was walking up on all fours because the steps were "too narrow". Around the same time, he has had speech disturbance. During conversation, he may have difficulty getting words out. While it started around the same time as the other symptoms, this still occurs. No associated double vision, difficulty swallowing, hearing loss or unilateral numbness or weakness.Sometimes during a conversation, he will suddenly talk about something else that happened several years ago.He had an MRI of the brain without contrast on 03/19/18 which was personally reviewed and demonstrated severe cerebellar atrophy.Since August 2018, he has had a 70 lb weight loss, not intentional.  He had an MRI of the brain with contrast on 05/02/2018 which was personally reviewed and demonstrated no abnormal contrast  enhancement.  B1 and thyroid panel unremarkable.  B12 level low normal range of 268.  When he was 28 years old, his biological mother left, so he doesn't know much about his mother's side of the family. He was  raised by his paternal grandparents.   No history of seizures and has no history of Dilantin use. He denies history of alcohol abuse.   Past Medical History: Past Medical History:  Diagnosis Date  . Hypertension     Medications: No outpatient encounter medications on file as of 11/27/2018.   No facility-administered encounter medications on file as of 11/27/2018.     Allergies: No Known Allergies  Family History: Family History  Problem Relation Age of Onset  . Healthy Mother   . Healthy Father     Social History: Social History   Socioeconomic History  . Marital status: Married    Spouse name: Not on file  . Number of children: 0  . Years of education: 14  . Highest education level: High school graduate  Occupational History  . Not on file  Social Needs  . Financial resource strain: Not on file  . Food insecurity    Worry: Not on file    Inability: Not on file  . Transportation needs    Medical: Not on file    Non-medical: Not on file  Tobacco Use  . Smoking status: Never Smoker  . Smokeless tobacco: Current User    Types: Chew  Substance and Sexual Activity  . Alcohol use: No  . Drug use: No  . Sexual activity: Not on file  Lifestyle  . Physical activity    Days per week: Not on file    Minutes per session: Not on file  . Stress: Not on file  Relationships  . Social Musician on phone: Not on file    Gets together: Not on file    Attends religious service: Not on file    Active member of club or organization: Not on file    Attends meetings of clubs or organizations: Not on file    Relationship status: Not on file  . Intimate partner violence    Fear of current or ex partner: Not on file    Emotionally abused: Not on file    Physically abused: Not on file    Forced sexual activity: Not on file  Other Topics Concern  . Not on file  Social History Narrative   Lives with wife in a one story home.  No children.  Getting ready to  start a new job.  Education: high school.     Observations/Objective:   Height 6' (1.829 m), weight 243 lb (110.2 kg). No acute distress.  Alert and oriented.  Speech fluent and not dysarthric.  Language intact.  Eyes orthophoric on primary gaze.  Face symmetric.  Assessment and Plan:   1.  Subacute onset of multiple symptomatology (dizziness, tremor, ataxia, headache, cognitive changes) in setting of cerebellar atrophy.  Testing for infectious, inflammatory or paraneoplastic etiologies ruled out.  Consider spinocerebellar ataxia.  He had a recent episode of vertigo.  This is a chronic issue and I do not believe is related to COVID.  1.  I recommended genetic testing for SCA, however such testing may be expensive.  He will first discuss with his wife and get back to [place].  2.  I will prepare letter for work. 3.  Follow up in 6 months.  Follow Up Instructions:    -  I discussed the assessment and treatment plan with the patient. The patient was provided an opportunity to ask questions and all were answered. The patient agreed with the plan and demonstrated an understanding of the instructions.   The patient was advised to call back or seek an in-person evaluation if the symptoms worsen or if the condition fails to improve as anticipated.    Total Time spent in visit with the patient was:  15 minutes    Dudley Major, DO

## 2018-11-25 NOTE — Telephone Encounter (Signed)
Patient called requesting a note for work stating he has vertigo. He said he's been very dizzy and he needs it for documentation.   Patient stated his wife will come and pick the note/letter up when it's complete.

## 2018-11-27 ENCOUNTER — Other Ambulatory Visit: Payer: Self-pay

## 2018-11-27 ENCOUNTER — Telehealth: Payer: Self-pay | Admitting: Neurology

## 2018-11-27 ENCOUNTER — Encounter: Payer: Self-pay | Admitting: Neurology

## 2018-11-27 ENCOUNTER — Telehealth (INDEPENDENT_AMBULATORY_CARE_PROVIDER_SITE_OTHER): Payer: BLUE CROSS/BLUE SHIELD | Admitting: Neurology

## 2018-11-27 VITALS — Ht 72.0 in | Wt 243.0 lb

## 2018-11-27 DIAGNOSIS — R42 Dizziness and giddiness: Secondary | ICD-10-CM

## 2018-11-27 DIAGNOSIS — G319 Degenerative disease of nervous system, unspecified: Secondary | ICD-10-CM | POA: Diagnosis not present

## 2018-11-27 DIAGNOSIS — R270 Ataxia, unspecified: Secondary | ICD-10-CM

## 2018-11-27 NOTE — Telephone Encounter (Signed)
Patient's wife called and is needing to speak with you regarding him having genetic testing. She has some questions for you. Thank you

## 2018-12-02 NOTE — Telephone Encounter (Signed)
I think the testing is via [facility].  It may be expensive.  One alternative is to see a specialist at one of the academic centers for a second opinion and to determine which testing may be most appropriate.

## 2018-12-02 NOTE — Telephone Encounter (Signed)
Dr. Tomi Likens   Patient's wife was calling back with questions about WHAT specialized genetic testing you wanted (in 9/16 note you said you recommended genetic testing for SCA but testing may be expensive and pt was going to discuss with wife and get back to [place])   Talked to wife today and she was inquiring about cost.  I told her I thought best thing to do was to get the name of the tests you want to order then to have her call insurance company and see if they cover them/what her cost would be.   Please let me know the exact lab test (I typed in SCA and there were many choices) and I will let wife know so she can proceed from there. Thanks.

## 2018-12-03 NOTE — Telephone Encounter (Signed)
Called and spoke to wife [name] . Informed her Dr. Tomi Likens stated that the best option for them regarding genetic testing would be for [place] to refer them to an academic center for a second opinion and to determine appropriate further testing.  [name] verbalized understanding. She and Damarco will talk about it and if they decide they would like a referral (Dr. Tomi Likens suggested either [facility] or [facility]) they will call us back.

## 2019-02-28 ENCOUNTER — Telehealth: Payer: Self-pay | Admitting: Neurology

## 2019-02-28 NOTE — Telephone Encounter (Signed)
Ongoing dizziness over one year now, would like a second opinion. Any recommendations to call patient back on Monday, note patient is working

## 2019-02-28 NOTE — Telephone Encounter (Signed)
Patient wife left msg with after hours about him being dizzy. Wants a second opinion. Couldn't go up steps and feels worse. Has he been tested for the huntington's disease? Hereditary testing since patient adopted. Please call wife with these answers. Thanks!

## 2019-03-05 IMAGING — MR MR HEAD W/ CM
3 series · 42 of 48 positions shown · IV contrast (20 ml multihance)
Comparison: 03/19/2018

CLINICAL DATA: Headache, dizziness, confusion and gait disturbance.
Previous motion degraded exam showed only cerebellar atrophy.

EXAM:
MRI HEAD WITH CONTRAST
TECHNIQUE: Multiplanar, multiecho pulse sequences of the brain and surrounding
structures were obtained with intravenous contrast.
CONTRAST:  20mL MULTIHANCE GADOBENATE DIMEGLUMINE 529 MG/ML IV SOLN

[Series 4: T2 · coronal · 5.0mm · 0.45mm/px · 6 of 27 slices shown]
[im 1/27]
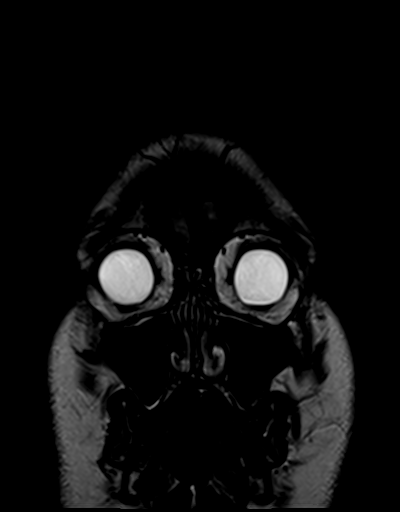
[im 6/27]
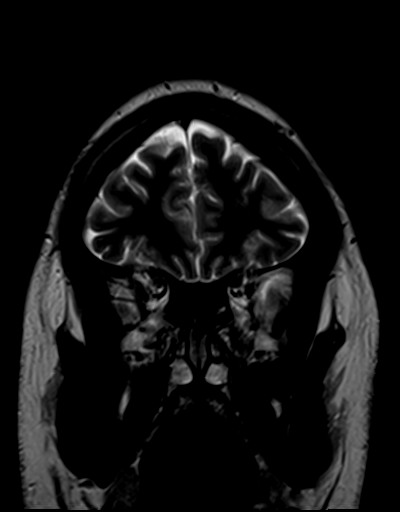
[im 11/27]
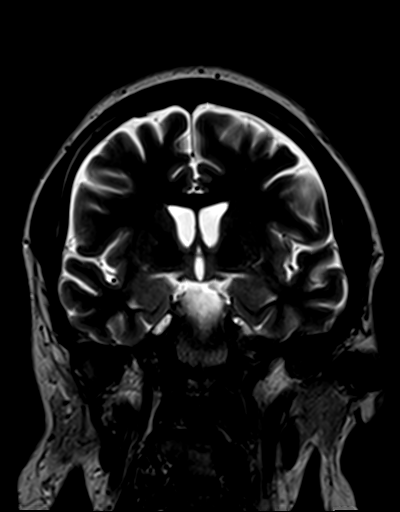
[im 16/27]
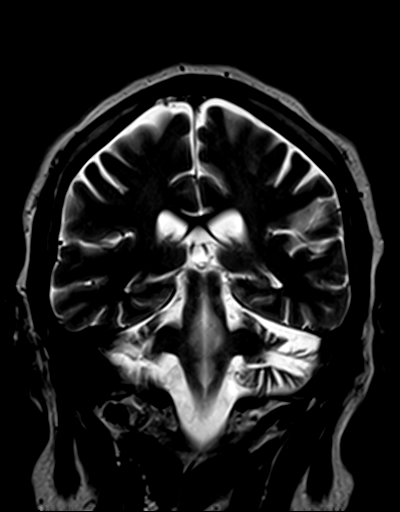
[im 21/27]
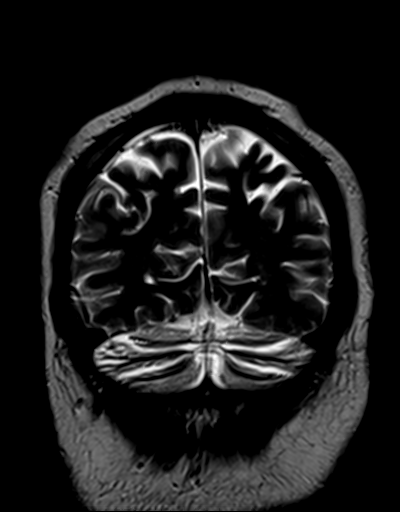
[im 27/27]
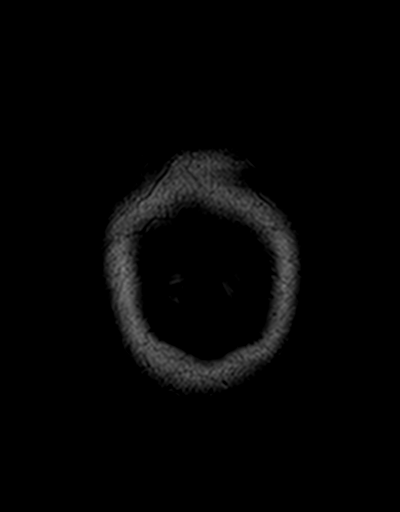

[Series 5: t1_mpr_tra · axial · 1.0mm · 0.75mm/px · z∈[-54,+79]mm · 29 of 144 slices shown]
[im 1/144]
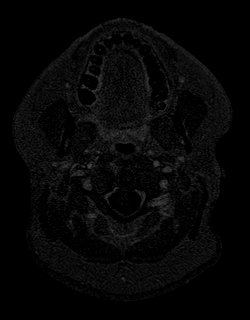
[im 5/144]
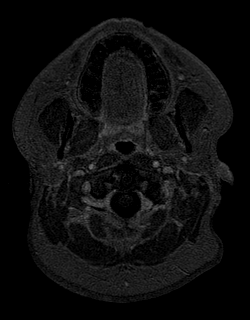
[im 9/144]
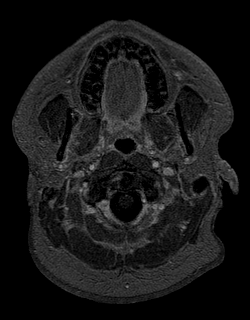
[im 13/144]
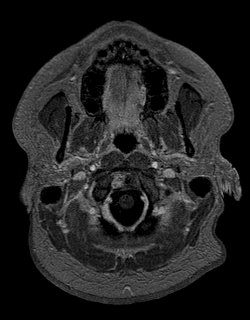
[im 17/144]
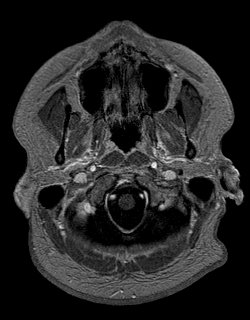
[im 22/144]
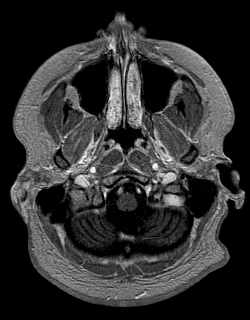
[im 26/144]
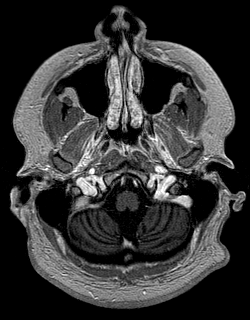
[im 30/144]
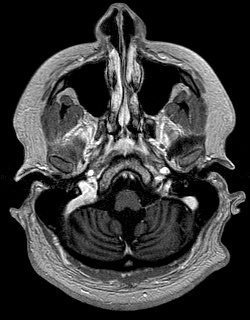
[im 34/144]
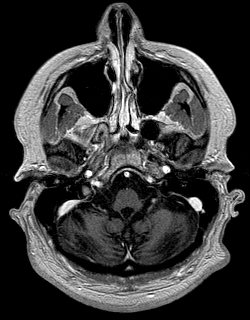
[im 38/144]
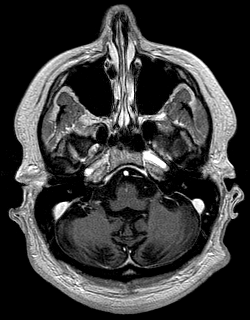
[im 43/144]
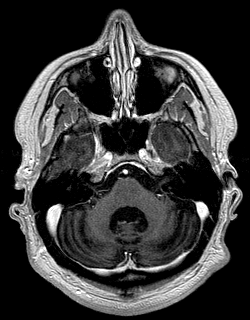
[im 47/144]
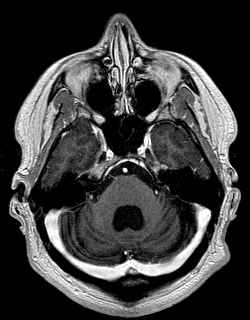
[im 51/144]
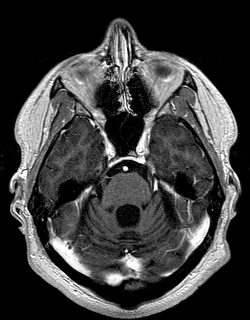
[im 55/144]
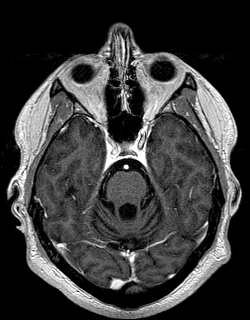
[im 59/144]
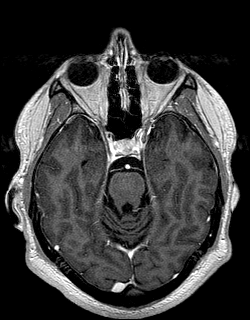
[im 64/144]
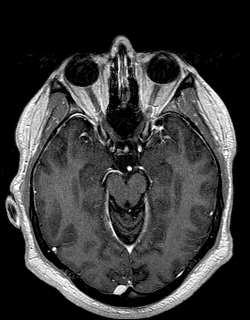
[im 68/144]
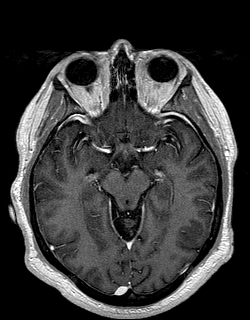
[im 72/144]
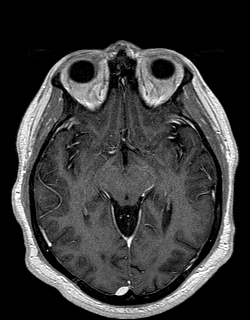
[im 76/144]
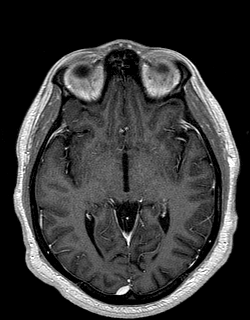
[im 80/144]
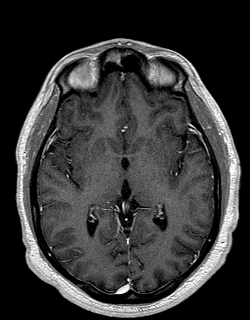
[im 85/144]
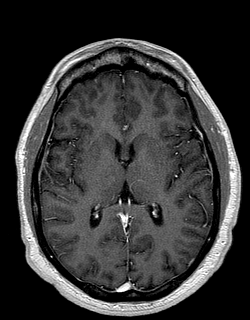
[im 89/144]
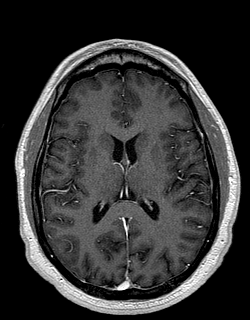
[im 93/144]
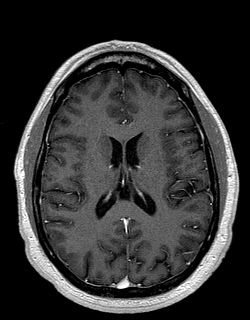
[im 97/144]
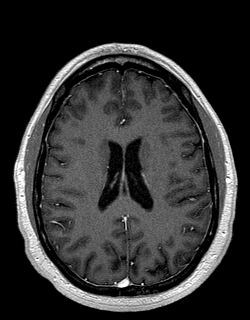
[im 101/144]
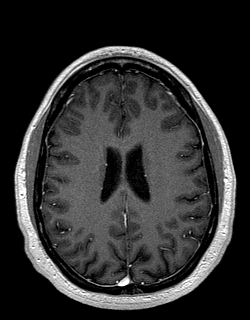
[im 106/144]
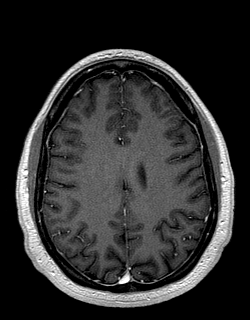
[im 118/144]
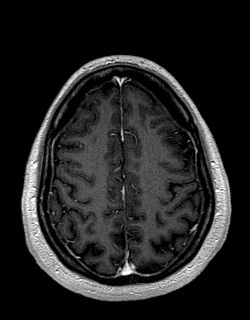
[im 122/144]
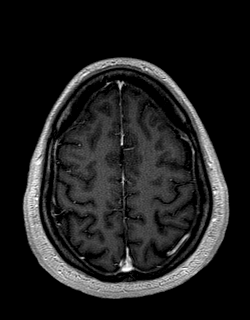
[im 135/144]
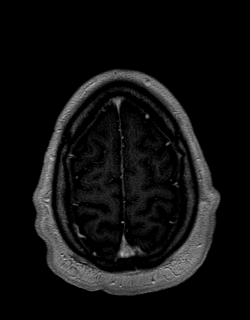

[Series 6: post cor · coronal · 5.0mm · 0.45mm/px · 7 of 27 slices shown]
[im 1/27]
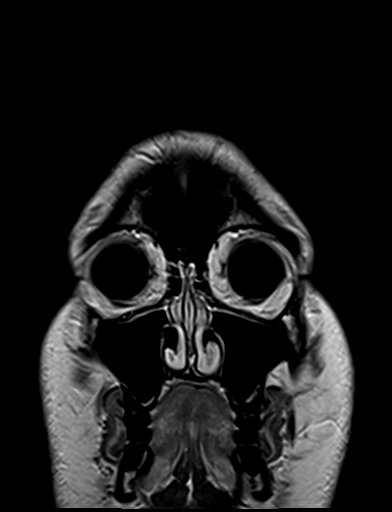
[im 5/27]
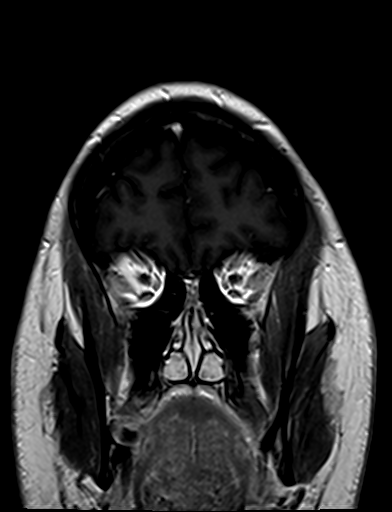
[im 9/27]
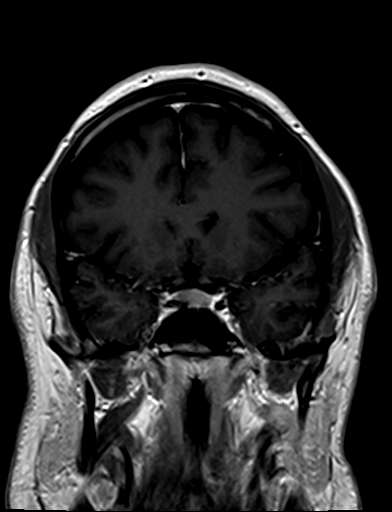
[im 14/27]
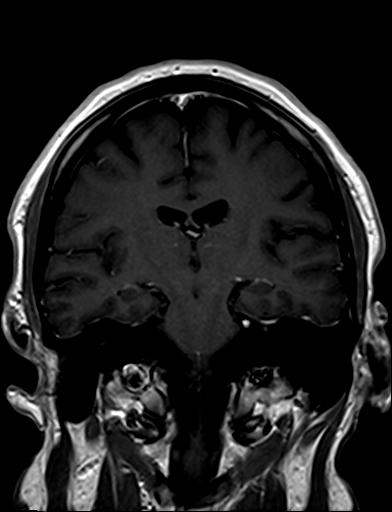
[im 18/27]
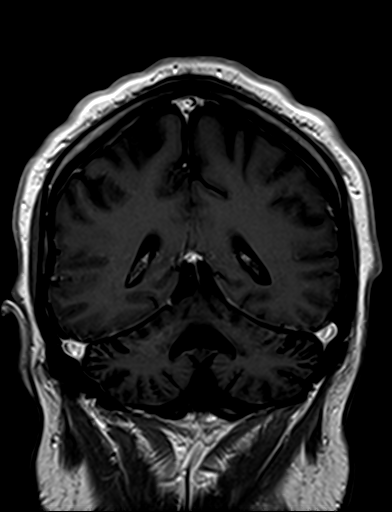
[im 22/27]
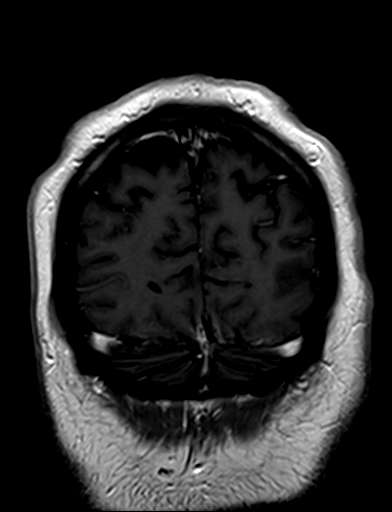
[im 27/27]
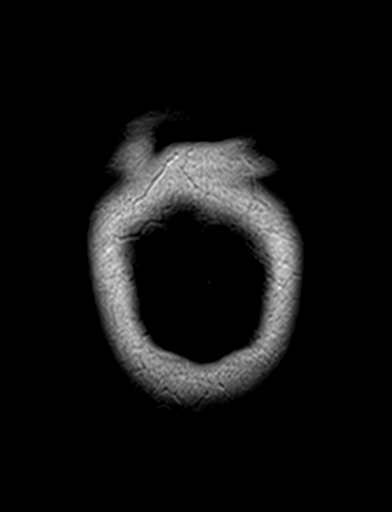

[42 of 48 positions shown; findings below may reference images not displayed]

FINDINGS: Brain: Today's post-contrast exam again shows advanced cerebellar
atrophy. Cerebral hemispheres do not show atrophy. No evidence of
focal stroke. After contrast administration, no abnormal enhancement
occurs. Arterial and venous structures appear patent.
IMPRESSION: Postcontrast examination today does not show any abnormal
enhancement. Major vascular structures appear patent. Again seen is
advanced but isolated cerebellar atrophy. In the absence of previous
toxin exposure, consideration should be given to spinocerebellar
atrophy syndromes.

## 2019-03-26 ENCOUNTER — Other Ambulatory Visit: Payer: Self-pay

## 2019-03-26 DIAGNOSIS — R270 Ataxia, unspecified: Secondary | ICD-10-CM

## 2019-03-26 DIAGNOSIS — G319 Degenerative disease of nervous system, unspecified: Secondary | ICD-10-CM

## 2019-03-26 DIAGNOSIS — R42 Dizziness and giddiness: Secondary | ICD-10-CM

## 2019-03-26 DIAGNOSIS — G43009 Migraine without aura, not intractable, without status migrainosus: Secondary | ICD-10-CM

## 2019-04-18 ENCOUNTER — Encounter: Payer: Self-pay | Admitting: Neurology

## 2019-04-21 ENCOUNTER — Other Ambulatory Visit: Payer: Self-pay

## 2019-04-21 ENCOUNTER — Telehealth (INDEPENDENT_AMBULATORY_CARE_PROVIDER_SITE_OTHER): Payer: BLUE CROSS/BLUE SHIELD | Admitting: Neurology

## 2019-04-21 ENCOUNTER — Encounter: Payer: Self-pay | Admitting: Neurology

## 2019-04-21 DIAGNOSIS — G119 Hereditary ataxia, unspecified: Secondary | ICD-10-CM | POA: Diagnosis not present

## 2019-04-21 DIAGNOSIS — G319 Degenerative disease of nervous system, unspecified: Secondary | ICD-10-CM

## 2019-04-21 NOTE — Progress Notes (Signed)
Virtual Visit via Video Note The purpose of this virtual visit is to provide medical care while limiting exposure to the novel coronavirus.    Consent was obtained for video visit:  Yes.   Answered questions that patient had about telehealth interaction:  Yes.   I discussed the limitations, risks, security and privacy concerns of performing an evaluation and management service by telemedicine. I also discussed with the patient that there may be a patient responsible charge related to this service. The patient expressed understanding and agreed to proceed.  Pt location: Home Physician Location: office Name of referring provider:  Caryl Bis, MD I connected with Samuel Stanley at patients initiation/request on 04/21/2019 at  3:30 PM EST by video enabled telemedicine application and verified that I am speaking with the correct person using two identifiers. Pt MRN:  [id number] Pt DOB:  Nov 25, 1990 Video Participants:  Samuel Stanley;  Samuel Stanley   History of Present Illness:  Samuel Stanley a 29 year old man who follows up to review MRI. He is accompanied by Samuel Stanley.  UPDATE:   He had an episode of vertigo on Monday, but that was the first time in awhile.  He took Samuel meclizine which helped.  However, Samuel work has not let him come back until he receives a note from work stating that Samuel dizziness is not related to Samuel Stanley.  He was tested for COVID in July, after presenting to the ED with fever, which was negative.  He has not had any recent fevers or cold-like symptoms.  HISTORY: He has history of migraines associated with dizziness since he was 29 years old. Since August 2018, he has had a 70 lb weight loss, not intentional.  In June 2019, Samuel family noticed he would sometimes start shaking in the hands. In August, he began having having fairly sudden onset of worsening dizziness, migraines and difficulty with balance. It is a severe pounding bifrontal headache. In addition to the  dizziness, he has associated nausea, photophobia but no vomiting, phonophobia, visual disturbance or unilateral numbness and weakness. They may last up to 3 hours (1 hour if treated with Excedrin Migraine). In August 2019, they were frequent (almost daily) and they resolved in late September. He had another one last week. No specific triggers. He has had problems with equilibrium off and on for several years. During same period of time, he had increased problems with equilibrium with difficulty walking but no falls. In September, while in [place], he walked up the steps of the [facility] on all fours. He states he was walking up on all fours because the steps were "too narrow". Around the same time, he has had speech disturbance. During conversation, he may have difficulty getting words out. While it started around the same time as the other symptoms, this still occurs. No associated double vision, difficulty swallowing, hearing loss or unilateral numbness or weakness.Sometimes during a conversation, he will suddenly talk about something else that happened several years ago.He had an MRI of the brain without contrast on 03/19/18 which was personally reviewed and demonstrated severe cerebellar atrophy.He had an MRI of the brain with contrast on 05/02/2018 which was personally reviewed and demonstrated no abnormal contrast enhancement. MRI of cervical spine with and without contrast on 05/25/18 was unremarkable. B1 and thyroid panel unremarkable. B12 level low normal range of 268.  He underwent lumbar puncture on 08/02/18.  CSF analysis demonstrated cell count 0, protein 42, no oligoclonal bands, IgG index <0.66, paraneoplastic panel  negative, cytology negative, gram stain and culture negative, Lyme negative, EBV negative, West Nile Virus negative, HSV 1/2 negative.  When he was 29 years old, Samuel biological mother left, so he doesn't know much about Samuel mother's side of the family. He  was raised by Samuel paternal grandparents.   No history of seizures and has no history of Dilantin use. He denies history of alcohol abuse.  Past Medical History: Past Medical History:  Diagnosis Date  . Hypertension     Medications: Outpatient Encounter Medications as of 04/21/2019  Medication Sig Note  . acetaminophen (TYLENOL) 325 MG tablet Take 650 mg by mouth every 6 (six) hours as needed. 11/27/2018: Alternates tylenol / advil prn headache  . ibuprofen (ADVIL) 400 MG tablet Take 400 mg by mouth every 6 (six) hours as needed.    No facility-administered encounter medications on file as of 04/21/2019.    Allergies: No Known Allergies  Family History: Family History  Problem Relation Age of Onset  . Healthy Mother   . Healthy Father   . Cancer Paternal Grandmother   . Cancer Paternal Grandfather     Social History: Social History   Socioeconomic History  . Marital status: Married    Spouse name: Not on file  . Number of children: 0  . Years of education: 58  . Highest education level: High school graduate  Occupational History  . Not on file  Tobacco Use  . Smoking status: Never Smoker  . Smokeless tobacco: Current User    Types: Chew  . Tobacco comment: smokeless tobacco   Substance and Sexual Activity  . Alcohol use: No  . Drug use: No  . Sexual activity: Not on file  Other Topics Concern  . Not on file  Social History Narrative   Lives with Stanley in a one story home.  No children.  Getting ready to start a new job.  Education: high school.    Caffeine - 1 soda / day   Gets exercise at work.   Social Determinants of Health   Financial Resource Strain:   . Difficulty of Paying Living Expenses: Not on file  Food Insecurity:   . Worried About Programme researcher, broadcasting/film/video in the Last Year: Not on file  . Ran Out of Food in the Last Year: Not on file  Transportation Needs:   . Lack of Transportation (Medical): Not on file  . Lack of Transportation (Non-Medical):  Not on file  Physical Activity:   . Days of Exercise per Week: Not on file  . Minutes of Exercise per Session: Not on file  Stress:   . Feeling of Stress : Not on file  Social Connections:   . Frequency of Communication with Friends and Family: Not on file  . Frequency of Social Gatherings with Friends and Family: Not on file  . Attends Religious Services: Not on file  . Active Member of Clubs or Organizations: Not on file  . Attends Banker Meetings: Not on file  . Marital Status: Not on file  Intimate Partner Violence:   . Fear of Current or Ex-Partner: Not on file  . Emotionally Abused: Not on file  . Physically Abused: Not on file  . Sexually Abused: Not on file    Observations/Objective:   There were no vitals taken for this visit. No acute distress.  Alert and oriented.  Speech fluent and not dysarthric.  Language intact.  Eyes orthophoric on primary gaze.  Face symmetric.  Assessment and Plan:   Subacute onset of multiple symptomatology (dizziness, tremor, ataxia, headache, cognitive changes) in setting of cerebellar atrophy.  Testing for infectious, inflammatory or paraneoplastic etiologies ruled out.  Consider Friedrich's ataxia but not typical age of onset.  Consider spinocerebellar ataxia but SCA is typically a slow onset.  I question if he may have had a viral cerebellitis.  While the CSF was unremarkable, it may have normalized as it was performed a year after onset of symptoms.    1.  Check antigliadin antibodies and anti-endomysial antibodies (Celiac disease related cerebellar ataxia), and anti-GAD antibodies (anti-GAD cerebellar ataxia). 2.  If above testing is unremarkable, I would proceed with genetic testing for SCA. 3.  At this time, symptoms are manageable.  In the future, we can always consider starting acetazolamide or carbamazepine. 4.  Follow up in 3 months.   Follow Up Instructions:    -I discussed the assessment and treatment plan with the  patient. The patient was provided an opportunity to ask questions and all were answered. The patient agreed with the plan and demonstrated an understanding of the instructions.   The patient was advised to call back or seek an in-person evaluation if the symptoms worsen or if the condition fails to improve as anticipated.    Cira Servant, DO

## 2019-04-22 ENCOUNTER — Other Ambulatory Visit: Payer: Self-pay

## 2019-04-22 DIAGNOSIS — G119 Hereditary ataxia, unspecified: Secondary | ICD-10-CM

## 2019-04-29 ENCOUNTER — Other Ambulatory Visit: Payer: BLUE CROSS/BLUE SHIELD

## 2019-04-29 ENCOUNTER — Other Ambulatory Visit: Payer: Self-pay

## 2019-04-30 LAB — GLIA (IGA/G) + TTG IGA
Antigliadin Abs, IgA: 3 units (ref 0–19)
Gliadin IgG: 2 units (ref 0–19)
Transglutaminase IgA: <2 U/mL (ref 0–3)

## 2019-05-01 LAB — GLUTAMIC ACID DECARBOXYLASE AUTO ABS: Glutamic Acid Decarb Ab: <5 IU/mL (ref <5)

## 2019-05-20 ENCOUNTER — Telehealth: Payer: Self-pay

## 2019-05-20 NOTE — Telephone Encounter (Signed)
Confirmed fax and confirmation,

## 2019-05-27 ENCOUNTER — Ambulatory Visit: Payer: Self-pay | Admitting: Neurology

## 2019-08-04 ENCOUNTER — Other Ambulatory Visit: Payer: Self-pay | Admitting: Neurology

## 2019-08-08 ENCOUNTER — Other Ambulatory Visit: Payer: Self-pay

## 2019-08-08 MED ORDER — MECLIZINE HCL 25 MG PO TABS: 25.0000 mg | ORAL_TABLET | Freq: Three times a day (TID) | ORAL | 0 refills | Status: DC | PRN

## 2019-08-28 ENCOUNTER — Ambulatory Visit (INDEPENDENT_AMBULATORY_CARE_PROVIDER_SITE_OTHER): Payer: Commercial Managed Care - PPO | Admitting: Neurology

## 2019-08-28 ENCOUNTER — Other Ambulatory Visit: Payer: Self-pay

## 2019-08-28 ENCOUNTER — Encounter: Payer: Self-pay | Admitting: Neurology

## 2019-08-28 ENCOUNTER — Telehealth: Payer: Self-pay | Admitting: Neurology

## 2019-08-28 VITALS — BP 129/85 | HR 107 | Ht 73.0 in | Wt 247.2 lb

## 2019-08-28 DIAGNOSIS — G119 Hereditary ataxia, unspecified: Secondary | ICD-10-CM | POA: Diagnosis not present

## 2019-08-28 DIAGNOSIS — Z820 Family history of epilepsy and other diseases of the nervous system: Secondary | ICD-10-CM

## 2019-08-28 NOTE — Telephone Encounter (Signed)
Forms filled out. Pt RTC to sign form as well. Will Fax to [facility] [phone]

## 2019-08-28 NOTE — Progress Notes (Signed)
[facility] Forms filled and faxed off

## 2019-08-28 NOTE — Patient Instructions (Signed)
1.  We will first check testing for ataxia/spinocerebellar ataxia. 2.  If testing unremarkable, then I would consider testing for Huntington's disease 3.  Follow up after testing.

## 2019-08-28 NOTE — Progress Notes (Signed)
NEUROLOGY FOLLOW UP OFFICE NOTE  Vittorio Mohs [id number]  HISTORY OF PRESENT ILLNESS: Samuel Stanley a 29 year old man who follows up to review MRI. He is accompanied by his wife.  UPDATE: Celiac panel and GAD antibodies were negative.  I had ordered SCA genetic panel but it was never performed.  His gait is declining.  Speech is worse as well.  No trouble with swallowing.    Since last visit, he learned more about his family history.  He was told that his mother's brother had Huntington's disease.  HISTORY: He has history of migraines associated with dizziness since he was 29 years old. Since August 2018, he has had a 70 lb weight loss, not intentional.  In June 2019, his family noticed he would sometimes start shaking in the hands. In August, he began having having fairly sudden onset of worsening dizziness, migraines and difficulty with balance. It is a severe pounding bifrontal headache. In addition to the dizziness, he has associated nausea, photophobia but no vomiting, phonophobia, visual disturbance or unilateral numbness and weakness. They may last up to 3 hours (1 hour if treated with Excedrin Migraine). In August 2019, they were frequent (almost daily) and they resolved in late September. He had another one last week. No specific triggers. He has had problems with equilibrium off and on for several years. During same period of time, he had increased problems with equilibrium with difficulty walking but no falls. In September, while in [place], he walked up the steps of the [facility] on all fours. He states he was walking up on all fours because the steps were "too narrow". Around the same time, he has had speech disturbance. During conversation, he may have difficulty getting words out. While it started around the same time as the other symptoms, this still occurs. No associated double vision, difficulty swallowing, hearing loss or unilateral numbness  or weakness.Sometimes during a conversation, he will suddenly talk about something else that happened several years ago.He had an MRI of the brain without contrast on 03/19/18 which was personally reviewed and demonstrated severe cerebellar atrophy.He had an MRI of the brain with contrast on 05/02/2018 which was personally reviewed and demonstrated no abnormal contrast enhancement. MRI of cervical spine with and without contrast on 05/25/18 was unremarkable. B1 and thyroid panel unremarkable. B12 level low normal range of 268.  He underwent lumbar puncture on 08/02/18. CSF analysis demonstrated cell count 0, protein 42, no oligoclonal bands, IgG index <0.66, paraneoplastic panel negative, cytology negative, gram stain and culture negative, Lyme negative, EBV negative, West Nile Virus negative, HSV 1/2 negative.  When he was 29 years old, his biological mother left, so he doesn't know much about his mother's side of the family. He was raised by his paternal grandparents.   No history of seizures and has no history of Dilantin use. He denies history of alcohol abuse.  PAST MEDICAL HISTORY: Past Medical History:  Diagnosis Date  . Hypertension     MEDICATIONS: Current Outpatient Medications on File Prior to Visit  Medication Sig Dispense Refill  . acetaminophen (TYLENOL) 325 MG tablet Take 650 mg by mouth every 6 (six) hours as needed.    Marland Kitchen ibuprofen (ADVIL) 400 MG tablet Take 400 mg by mouth every 6 (six) hours as needed.    . meclizine (ANTIVERT) 25 MG tablet Take 1 tablet (25 mg total) by mouth 3 (three) times daily as needed for dizziness. 30 tablet 0   No current facility-administered medications on  file prior to visit.    ALLERGIES: No Known Allergies  FAMILY HISTORY: Family History  Problem Relation Age of Onset  . Healthy Mother   . Healthy Father   . Cancer Paternal Grandmother   . Cancer Paternal Grandfather     SOCIAL HISTORY: Social History   Socioeconomic  History  . Marital status: Married    Spouse name: Not on file  . Number of children: 0  . Years of education: 89  . Highest education level: High school graduate  Occupational History  . Not on file  Tobacco Use  . Smoking status: Never Smoker  . Smokeless tobacco: Current User    Types: Chew  . Tobacco comment: smokeless tobacco   Vaping Use  . Vaping Use: Never used  Substance and Sexual Activity  . Alcohol use: No  . Drug use: No  . Sexual activity: Not on file  Other Topics Concern  . Not on file  Social History Narrative   Lives with wife in a one story home.  No children.  Getting ready to start a new job.  Education: high school.    Caffeine - 1 soda / day   Gets exercise at work.   Social Determinants of Health   Financial Resource Strain:   . Difficulty of Paying Living Expenses:   Food Insecurity:   . Worried About Programme researcher, broadcasting/film/video in the Last Year:   . Barista in the Last Year:   Transportation Needs:   . Freight forwarder (Medical):   Marland Kitchen Lack of Transportation (Non-Medical):   Physical Activity:   . Days of Exercise per Week:   . Minutes of Exercise per Session:   Stress:   . Feeling of Stress :   Social Connections:   . Frequency of Communication with Friends and Family:   . Frequency of Social Gatherings with Friends and Family:   . Attends Religious Services:   . Active Member of Clubs or Organizations:   . Attends Banker Meetings:   Marland Kitchen Marital Status:   Intimate Partner Violence:   . Fear of Current or Ex-Partner:   . Emotionally Abused:   Marland Kitchen Physically Abused:   . Sexually Abused:    PHYSICAL EXAM: Blood pressure 129/85, pulse (!) 107, height 6\' 1"  (1.854 m), weight 247 lb 3.2 oz (112.1 kg), SpO2 97 %. General: No acute distress.  Patient appears well-groomed.   Head:  Normocephalic/atraumatic Eyes:  Fundi examined but not visualized Neck: supple, no paraspinal tenderness, full range of motion Heart:  Regular  rate and rhythm Lungs:  Clear to auscultation bilaterally Back: No paraspinal tenderness Neurological Exam: alert and oriented to person, place, and time. Attention span and concentration intact, recent and remote memory intact, fund of knowledge intact.  Speech slow but still fluent and not dysarthric, language intact.  CN II-XII intact. Bulk and tone normal, muscle strength 5/5 throughout.  Sensation to pinprick and vibration intact.  Deep tendon reflexes 2+ throughout, toes downgoing.  Finger to nose and heel to shin testing with mild tremulousness.  Unsteady gait.  Unable to tandem walk.  Romberg with sway.  IMPRESSION: 1. Subacute onset of ataxia, dizziness, tremor, speech difficulty in setting of cerebellar atrophy. 2.  Family history of Huntington's disease  Symptoms and cerebellar atrophy are most suspicious for spinocerebellar ataxia.  However, given his recently discovered family history, cannot completely rule out Huntington's disease.  He doesn't exhibit chorea or other involuntary movements at  this time, but that is not to say it won't develop in the future.  I once had a patient, much older than Mr. Malkin, who carried a diagnosis of SCA due to his ataxia and cerebellar atrophy.  However, he later was diagnosed with Huntington's disease.  PLAN: 1.  I would first proceed with ataxia panel including SCA via [facility] 2.  If negative, I would then test for Huntington's disease 3.  Follow up after testing.  Shon Millet, DO  CC: Donzetta Sprung, MD

## 2019-08-28 NOTE — Telephone Encounter (Signed)
Patient wife states that she needs to talk with someone today. She states that [facility] does not have the lab test.  Please call

## 2019-09-23 ENCOUNTER — Encounter: Payer: Self-pay | Admitting: Neurology

## 2019-09-29 ENCOUNTER — Telehealth: Payer: Self-pay | Admitting: Neurology

## 2019-09-29 NOTE — Telephone Encounter (Signed)
Patient's wife called in. She wants to speak with someone regarding the referrals we sent to [facility]. She stated the numbers she had did not work and one was never received.

## 2019-09-29 NOTE — Telephone Encounter (Signed)
[facility] [phone], fax [phone].  [facility] [phone]

## 2019-10-01 ENCOUNTER — Other Ambulatory Visit: Payer: Self-pay

## 2019-10-01 ENCOUNTER — Telehealth: Payer: Self-pay

## 2019-10-01 DIAGNOSIS — G119 Hereditary ataxia, unspecified: Secondary | ICD-10-CM

## 2019-10-01 NOTE — Telephone Encounter (Signed)
Referral faxed to [facility]. 

## 2019-10-18 ENCOUNTER — Other Ambulatory Visit: Payer: Self-pay | Admitting: Neurology

## 2019-10-20 ENCOUNTER — Telehealth: Payer: Self-pay

## 2019-10-20 NOTE — Telephone Encounter (Signed)
Pt Wife [name] called, Per [facility] they never received pt referral.    Advised Pt Wife, Referral faxed to [facility] 10/01/19. Will resend fax [phone]. Last referral faxed to another fax number given on 10/01/19

## 2019-10-23 ENCOUNTER — Telehealth: Payer: Self-pay | Admitting: Neurology

## 2019-10-23 NOTE — Telephone Encounter (Signed)
SCA panel from [facility] was negative.  I discussed results with Mr. Shorey via phone.  Next step would be to test for Huntington's Disease.

## 2019-10-27 ENCOUNTER — Other Ambulatory Visit: Payer: Self-pay

## 2019-10-27 ENCOUNTER — Telehealth: Payer: Self-pay | Admitting: Neurology

## 2019-10-27 NOTE — Telephone Encounter (Signed)
Patient's wife called in and left a message stating they received a missed call for the patient.

## 2019-10-27 NOTE — Telephone Encounter (Signed)
OK. Then hold the order.  It appears that he was evaluated at [facility] last week.  Will wait for their impression.

## 2019-10-27 NOTE — Telephone Encounter (Signed)
Per Pt wife, They made a mistake, Huntington's not on Precious side of the family but a sister who has not relation to [place].

## 2019-10-27 NOTE — Telephone Encounter (Signed)
Dr. Arbutus Leas will let us know.

## 2019-10-27 NOTE — Telephone Encounter (Signed)
Tried callig phone number on file. No answer. Need pt to come and sign paperwork to send of for labs needed.

## 2019-11-05 ENCOUNTER — Telehealth: Payer: Self-pay | Admitting: Neurology

## 2019-11-05 NOTE — Telephone Encounter (Signed)
Called [facility] to see if they received referral for patient for Dr. Francine Graven. They did not receive it. Refaxed on 11/05/19.

## 2019-11-07 ENCOUNTER — Ambulatory Visit: Payer: Commercial Managed Care - PPO

## 2019-11-07 ENCOUNTER — Telehealth: Payer: Self-pay

## 2019-11-07 ENCOUNTER — Other Ambulatory Visit: Payer: Self-pay

## 2019-11-07 ENCOUNTER — Ambulatory Visit: Payer: Commercial Managed Care - PPO | Attending: Neurology

## 2019-11-07 DIAGNOSIS — R209 Unspecified disturbances of skin sensation: Secondary | ICD-10-CM | POA: Diagnosis present

## 2019-11-07 DIAGNOSIS — R471 Dysarthria and anarthria: Secondary | ICD-10-CM | POA: Diagnosis present

## 2019-11-07 DIAGNOSIS — R2681 Unsteadiness on feet: Secondary | ICD-10-CM | POA: Diagnosis present

## 2019-11-07 DIAGNOSIS — R208 Other disturbances of skin sensation: Secondary | ICD-10-CM

## 2019-11-07 DIAGNOSIS — R2689 Other abnormalities of gait and mobility: Secondary | ICD-10-CM | POA: Insufficient documentation

## 2019-11-07 DIAGNOSIS — R260 Ataxic gait: Secondary | ICD-10-CM

## 2019-11-07 NOTE — Therapy (Addendum)
[facility] [address] [place], [place], [address] Phone: [phone]   Fax:  [phone]  Physical Therapy Evaluation  Patient Details  Name: Samuel Stanley MRN: [id number] Date of Birth: 1990-06-09 Referring Provider (PT): Francine Graven, DO   Encounter Date: 11/07/2019   PT End of Session - 11/07/19 1636    Visit Number 1    Number of Visits 17    Date for PT Re-Evaluation 02/05/20   POC for 8 weeks, Cert for 90 days   Authorization Type [employer]    PT Start Time 1406   patient finishing up ST   PT Stop Time 1445    PT Time Calculation (min) 39 min    Equipment Utilized During Treatment Gait belt    Activity Tolerance Patient tolerated treatment well    Behavior During Therapy WFL for tasks assessed/performed           Past Medical History:  Diagnosis Date  . Hypertension     Past Surgical History:  Procedure Laterality Date  . HERNIA REPAIR    . testicular torsion      There were no vitals filed for this visit.    Subjective Assessment - 11/07/19 1411    Subjective Patient/wife reports that in June 2019  they began to notice he had difficulties with balance and walking, along with speech difficulties. Has also had dizziness and migraines, but has not had those recently. Patient has been tested for various neurological diagnoses, spinocerebellar ataxia panel was negative. They have no official diagnosis at this time, MRI showed cerebellar atrophy. Wife reports patient has difficutly standing and holding 74 month old child. Most balance issues are noticed with static standing and walking  Ambulated into clinic without AD today, but has been using rollator for longer distances. Unable to go up stairs unless he has handrails. No pain.    Patient is accompained by: Family member   Wife - Samuel Stanley   Pertinent History Hypertension    Limitations Walking;Standing;House hold activities    Patient Stated Goals  Being able to walk and stand    Currently in Pain? No/denies              [facility] PT Assessment - 11/07/19 1421      Assessment   Medical Diagnosis Difficulty with Gait and Balance    Referring Provider (PT) Francine Graven, DO    Onset Date/Surgical Date --   noticed symptoms beginning June 2019   Hand Dominance Left    Prior Therapy Outpatient PT (for foot)       Precautions   Precautions Fall      Balance Screen   Has the patient fallen in the past 6 months No   2 near falls (tripped over rug; outdoors)   Has the patient had a decrease in activity level because of a fear of falling?  Yes    Is the patient reluctant to leave their home because of a fear of falling?  Yes      Home Environment   Living Environment Private residence    Living Arrangements Spouse/significant other    Available Help at Discharge Family    Type of Home House    Home Access Stairs to enter    Entrance Stairs-Number of Steps 1    Entrance Stairs-Rails None   no rails, patient uses door frame   Home Layout One level    Home Equipment Walker - 4 wheels;Grab bars - tub/shower  Additional Comments wife reports difficulty going up stairs therefore attempt to avoid them at all costs, unable to unless he has handrails.       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, currently working [employer]    Leisure --      Cognition   Overall Cognitive Status Within Functional Limits for tasks assessed      Sensation   Light Touch Impaired by gross assessment    Additional Comments decreased sensation on BLE's, specifically noted on dermatome S1/S2      Coordination   Gross Motor Movements are Fluid and Coordinated No    Fine Motor Movements are Fluid and Coordinated No    Coordination and Movement Description difficulty with RAM of BUE    Heel Shin Test increased difficulty noted with completion      ROM / Strength    AROM / PROM / Strength AROM;Strength      AROM   Overall AROM  Within functional limits for tasks performed    Overall AROM Comments AROM seems to be [facility] for BLE      Strength   Overall Strength Within functional limits for tasks performed    Strength Assessment Site Hip;Knee;Ankle    Right/Left Hip Right;Left    Right Hip Flexion 5/5    Right Hip ABduction 4+/5    Right Hip ADduction 5/5    Left Hip Flexion 5/5    Left Hip ABduction 4+/5    Left Hip ADduction 5/5    Right/Left Knee Right;Left    Right Knee Flexion 4+/5    Right Knee Extension 4+/5    Left Knee Flexion 4+/5    Left Knee Extension 4+/5    Right/Left Ankle Right;Left    Right Ankle Dorsiflexion 4+/5    Left Ankle Dorsiflexion 4+/5      Bed Mobility   Bed Mobility Rolling Right;Rolling Left;Supine to Sit;Sit to Supine    Rolling Right Independent    Rolling Left Independent    Supine to Sit Independent    Sit to Supine Independent      Transfers   Transfers Sit to Stand;Stand to Sit    Sit to Stand 4: Min guard    Sit to Stand Details Tactile cues for posture;Verbal cues for precautions/safety    Sit to Stand Details (indicate cue type and reason) patient unable to complete sit <> stand without UE support, require UE support to come fully upright.    Five time sit to stand comments  completed w/ UE support from standard height chair: 23.84 secs    Stand to Sit 4: Min guard    Comments patient demo increased balance challenge upon standing position      Ambulation/Gait   Ambulation/Gait Yes    Ambulation/Gait Assistance 4: Min guard    Ambulation Distance (Feet) 200 Feet    Assistive device None    Gait Pattern Decreased arm swing - right;Decreased arm swing - left;Decreased step length - right;Decreased step length - left;Ataxic;Poor foot clearance - left;Poor foot clearance - right    Ambulation Surface Level;Indoor    Gait velocity 17.6 secs = 1.86 ft/sec    Stairs Yes    Stairs Assistance 4: Min  guard    Stair Management Technique Two rails;Alternating pattern;Forwards    Number of Stairs 4    Height of Stairs 6    Gait Comments With ascending/descending stairs, increased  time and concentration required for proper foot placement. All ambulation completed without AD today.       Standardized Balance Assessment   Standardized Balance Assessment Timed Up and Go Test      Timed Up and Go Test   TUG Normal TUG    Normal TUG (seconds) 16.75    TUG Comments without AD                      Objective measurements completed on examination: See above findings.               PT Education - 11/07/19 1752    Education Details Educated on POC, Evaluation Findings, and Educated on use of Rollator at this time for safety due to increased fall risk.    Person(s) Educated Patient;Spouse    Methods Explanation    Comprehension Verbalized understanding            PT Short Term Goals - 11/07/19 1758      PT SHORT TERM GOAL #1   Title Patient will be independent with Initial HEP for balance/strength (ALL STGs: 12/05/2019)    Baseline no HEP established    Time 4    Period Weeks    Status New    Target Date 12/05/19      PT SHORT TERM GOAL #2   Title Patient will undergo further balance assessment with BERG Balance and LTG to be set as appropriate    Baseline not yet assessed    Time 4    Period Weeks    Status New      PT SHORT TERM GOAL #3   Title Patient will demo ability to complete 5x sit <> stand in </= 20 seconds with UE support to demo improved functional strength/mobility    Baseline 23.84 secs    Time 4    Period Weeks    Status New      PT SHORT TERM GOAL #4   Title Patient will demo ability to complete TUG </= 15 seconds to demonstrate reduced fall risk    Baseline 16.75    Time 4    Period Weeks    Status New             PT Long Term Goals - 11/07/19 1801      PT LONG TERM GOAL #1   Title Patient will be independent with final  HEP for balance/strengthening (ALL LTGs Due: 01/02/20)    Baseline no HEP established    Time 8    Period Weeks    Status New    Target Date 01/02/20      PT LONG TERM GOAL #2   Title LTG to be set as appropriate for [employer] Balance    Baseline TBA    Time 8    Period Weeks    Status New      PT LONG TERM GOAL #3   Title Patient will improve TUG </= 12 seconds to demonstrate reduced fall risk    Baseline 16.75 secs    Time 8    Period Weeks    Status New      PT LONG TERM GOAL #4   Title Patient will improve 5x sit<> stand to </= 15 seconds with UE to demo improved functional mobility    Baseline 23.84 secs    Time 8    Period Weeks    Status New      PT LONG TERM  GOAL #5   Title Patient will improve gait speed to >/= 2.5 ft/sec with LRAD to demo improved community ambulation    Baseline 1.86 ft/sec    Time 8    Period Weeks    Status New      Additional Long Term Goals   Additional Long Term Goals Yes      PT LONG TERM GOAL #6   Title Patient will demo ability to ascend/descend 12 stairs, alternating pattern, with supervision and single hand rail to allow for improved functional mobility    Baseline CGA and bilateral rails    Time 8    Period Weeks    Status New                  Plan - 11/07/19 1753    Clinical Impression Statement Patient is a 29 y.o. male that was referred to Neuro OPPT services due to abnormal balance and gait. Patient's PMH includes: Hypertension. Patient has had balance deficits and speech deficits that have gradually worsened since approx. June 2019. Patient recently had MRI of brain which demonstrated cerebellar atrophy, however no official diagnosis at this time. Continuing to undergo testing. Upon evaluation patient presents with the following impairments: decreased balance, decreased functional mobility, ataxic gait, impaired sensation, decreased coordination, and overall increased risk for falls. Patient ambulating without AD today  during session requiring CGA, according to patient/wife reports patient ambulates with rollator for longer distances. Patient demonstrates an increased risk for falls with TUG time of 16.75 secs. Patient will benefit from skilled PT services to maximize functional mobility, and reduce risk for falls.    Personal Factors and Comorbidities Comorbidity 1;Time since onset of injury/illness/exacerbation    Comorbidities HTN    Examination-Activity Limitations Bend;Carry;Caring for Others;Stairs;Stand;Locomotion Level;Squat;Transfers    Examination-Participation Restrictions Occupation;Community Activity;Yard Work    Conservation officer, historic buildings Evolving/Moderate complexity    Clinical Decision Making Moderate    Rehab Potential Fair    PT Frequency 2x / week    PT Duration 8 weeks    PT Treatment/Interventions ADLs/Self Care Home Management;Aquatic Therapy;Electrical Stimulation;Moist Heat;Contrast Bath;DME Instruction;Gait training;Functional mobility training;Neuromuscular re-education;Stair training;Therapeutic activities;Patient/family education;Therapeutic exercise;Balance training;Orthotic Fit/Training;Manual techniques;Passive range of motion    PT Next Visit Plan Assess Berg; Mention Aquatics Therapy for PT (believe patient would be awesome candidate) Initiate HEP. Investment banker, operational. Did we get OT Referral?    Recommended Other Services Aquatic, Occupational Therapy    Consulted and Agree with Plan of Care Patient;Family member/caregiver    Family Member Consulted Wife (Samuel Stanley)           Patient will benefit from skilled therapeutic intervention in order to improve the following deficits and impairments:  Abnormal gait, Decreased coordination, Difficulty walking, Decreased safety awareness, Decreased activity tolerance, Impaired sensation, Postural dysfunction, Decreased strength, Decreased mobility, Decreased balance, Decreased knowledge of use of DME, Decreased endurance  Visit  Diagnosis: Ataxic gait  Other abnormalities of gait and mobility  Unsteadiness on feet  Other disturbances of skin sensation     Problem List Patient Active Problem List   Diagnosis Date Noted  . Anterior dislocation of right shoulder 03/01/2016    Tempie Donning, PT, DPT 11/07/2019, 6:07 PM  [facility] [address] [place], [place], [address] Phone: [phone]   Fax:  [phone]  Name: Samuel Stanley MRN: [id number] Date of Birth: 1990-06-24

## 2019-11-07 NOTE — Therapy (Addendum)
[facility] [address] [place], [place], [address] Phone: [phone]   Fax:  [phone]  Speech Language Pathology Evaluation  Patient Details  Name: Samuel Stanley MRN: [id number] Date of Birth: 1990-09-22 Referring Provider (SLP): Francine Graven, MD   Encounter Date: 11/07/2019   End of Session - 11/07/19 1435    Visit Number 1    Number of Visits 13    Date for SLP Re-Evaluation 01/09/20    SLP Start Time 1318    SLP Stop Time  1401    SLP Time Calculation (min) 43 min    Activity Tolerance Patient tolerated treatment well           Past Medical History:  Diagnosis Date  . Hypertension     Past Surgical History:  Procedure Laterality Date  . HERNIA REPAIR    . testicular torsion      There were no vitals filed for this visit.   Subjective Assessment - 11/07/19 1328    Subjective "Hey"    Patient is accompained by: Family member   [name]             SLP Evaluation [facility] - 11/07/19 1338      SLP Visit Information   SLP Received On 11/07/19    Referring Provider (SLP) Francine Graven, MD    Onset Date 2019    Medical Diagnosis Dysarthria      Subjective   Patient/Family Stated Goal "That everyone can understand me"      General Information   HPI Noted hands shaking in 2019; in August 2019 migraines and balance problems, and speech began to be slurred. MRI 10-30-19 [facility]- "relative atrophy affecting cerebellum withmoderate to severe diffuse atrophy affecting both cerebellar hemispheresincluding the vermis. There is associated moderate volume loss of themiddle cerebellar peduncles. There is also mild generalized atrophy of thepons and the medulla. may be neurodegenerative disease such as pontocerebellar atrophy, or a congenital disorder, of cerebellar hypoplasia. Pt's neurologist at [facility] wanted pt to start PT and ST to preserve function.      Prior Functional Status    Cognitive/Linguistic Baseline Within functional limits      Cognition   Overall Cognitive Status Within Functional Limits for tasks assessed      Auditory Comprehension   Overall Auditory Comprehension Appears within functional limits for tasks assessed      Verbal Expression   Overall Verbal Expression Appears within functional limits for tasks assessed      Oral Motor/Sensory Function   Overall Oral Motor/Sensory Function Impaired    Labial ROM Within Functional Limits    Labial Symmetry Within Functional Limits    Labial Strength Reduced Right    Lingual ROM Within Functional Limits    Lingual Symmetry Within Functional Limits    Lingual Strength Reduced Right    Lingual Coordination Reduced    Facial Sensation --   reduced rt, "near my ear" "middle of my cheek"     Motor Speech   Overall Motor Speech Impaired    Articulation Impaired    Level of Impairment Word    Intelligibility Intelligibility reduced -75% actual (83% functional) in 24 utterances of 2-9 words.                          SLP Education - 11/07/19 1435    Education Details speech compensations, therapy goals for pt to learn speech compensations, overt s/sx dysphagia  Person(s) Educated Patient;Spouse    Methods Explanation;Handout    Comprehension Verbalized understanding;Need further instruction            SLP Short Term Goals - 11/07/19 1651      SLP SHORT TERM GOAL #1   Title Pt will demo 90% functional communication over 8 minutes simple conversation in 2 sessions    Time 4    Period Weeks    Status New      SLP SHORT TERM GOAL #2   Title pt will complete dysarthria HEP for speech intelligibility ("tongue twisters") with 75% use of compensations, in 2 sessions    Time 4    Period Weeks    Status New            SLP Long Term Goals - 11/07/19 1652      SLP LONG TERM GOAL #1   Title pt will demo 10 mintues of simple conversation with >90%  intelligibilty (functionally)  x3 visits    Time 6   or 13 total visits, for all LTGs   Period Weeks   or 13 total visits, for all LTGs   Status New      SLP LONG TERM GOAL #2   Title pt will report ordering at a fast food restaurant x3 with one repeat necessary    Time 6    Period Weeks    Status New      SLP LONG TERM GOAL #3   Title pt's speech related QOL measure will be <85 indicating less burden from his speech on QOL than at date of evaluation    Time 6    Period Weeks    Status New            Plan - 11/07/19 1436    Clinical Impression Statement Pt presents with mod ataxic dysarthria, with functional intelligilibity at 83% in 24 utterances. Pt and wife deny any overt s/sx dysphagia at home with meals or liquids. SLP told pt/wife overt s/sx and asked them to monitor for these in the coming weeks. Pt's speech related QOL was reported at 104/135 (higher score = lower QOL). Pt looked to wife to communicate for him during the evaluation until SLP told pt that it would be helpful for SLP to hear and evaluate his speech. Wife indicated pt does ont want to order via drive through or at restaurants and has family do this for him, as recently as last weekend. Pt told SLP that he speaks louder and slows his speech rate when it seems someone does not undertand him however wife reports that most people have a difficult time understanding pt despite pt's attempts at improving his intelligibility. "It's like he gets frustrated when people can't understand him after 4-5 times repeating. He starts to speed up," stated [name]'s wife [name]. SLP believes pt would benefit from skilled ST at this time, targeting speech intelligibility strategies and a home exercise program to begin to make compensations for more intelligibile speech habitual for pt.    Speech Therapy Frequency 2x / week    Duration --   6 weeks or 13 total sessions   Treatment/Interventions Environmental controls;Other (comment);SLP instruction and feedback;Compensatory  strategies;Patient/family education;Internal/external aids    Potential to Achieve Goals Good    Potential Considerations Severity of impairments           Patient will benefit from skilled therapeutic intervention in order to improve the following deficits and impairments:   Ataxic dysarthria - Plan:  SLP plan of care cert/re-cert    Problem List Patient Active Problem List   Diagnosis Date Noted  . Anterior dislocation of right shoulder 03/01/2016    [facility] 11/07/2019, 5:05 PM  [facility] [address] [place], [place], [address] Phone: [phone]   Fax:  [phone]  Name: Samuel Stanley MRN: [id number] Date of Birth: 21-Nov-1990

## 2019-11-07 NOTE — Patient Instructions (Addendum)
   When you talk, think about doing these 4 things - we will focus our sessions on finding out which few work the best for you and get you to concentrate on those. Thing "SLOP"   Talk SLOW Talk LOUD Over-enunciate, ("Talk big", "open your mouth wider") PAUSE between your words

## 2019-11-07 NOTE — Addendum Note (Signed)
Addended by: Jethro Bastos B on: 11/07/2019 06:09 PM   Modules accepted: Orders

## 2019-11-07 NOTE — Telephone Encounter (Addendum)
Dr. Hazle Quant, Samuel Stanley was evaluated by Physical Therapy on 11/07/2019.  The patient would benefit from Occupational Therapy evaluation for coordination and ataxia.   If you agree, please place an order in [facility] in [facility] or fax the order to [phone]. Thank you, Adelfa Koh, PT, DPT  [facility] [address] [place], [place]  [address] Phone:  [phone] Fax:  [phone]  (Faxed on 11/10/19)

## 2019-11-12 ENCOUNTER — Other Ambulatory Visit: Payer: Self-pay

## 2019-11-12 ENCOUNTER — Ambulatory Visit: Payer: Commercial Managed Care - PPO | Attending: Neurology

## 2019-11-12 DIAGNOSIS — R260 Ataxic gait: Secondary | ICD-10-CM | POA: Insufficient documentation

## 2019-11-12 DIAGNOSIS — R2689 Other abnormalities of gait and mobility: Secondary | ICD-10-CM | POA: Diagnosis present

## 2019-11-12 DIAGNOSIS — R2681 Unsteadiness on feet: Secondary | ICD-10-CM | POA: Insufficient documentation

## 2019-11-12 DIAGNOSIS — M6281 Muscle weakness (generalized): Secondary | ICD-10-CM | POA: Diagnosis not present

## 2019-11-12 DIAGNOSIS — R471 Dysarthria and anarthria: Secondary | ICD-10-CM | POA: Insufficient documentation

## 2019-11-12 NOTE — Therapy (Signed)
[facility] [address] [place], [place], [address] Phone: [phone]   Fax:  [phone]  Physical Therapy Treatment  Patient Details  Name: Samuel Stanley MRN: [id number] Date of Birth: [birth year] Referring Provider (PT): Francine Graven, DO   Encounter Date: 11/12/2019   PT End of Session - 11/12/19 1705    Visit Number 2    Number of Visits 17    Date for PT Re-Evaluation 02/05/20   POC for 8 weeks, Cert for 90 days   Authorization Type [employer]    PT Start Time 1701    PT Stop Time 1748    PT Time Calculation (min) 47 min    Equipment Utilized During Treatment Gait belt    Activity Tolerance Patient tolerated treatment well    Behavior During Therapy WFL for tasks assessed/performed           Past Medical History:  Diagnosis Date   Hypertension     Past Surgical History:  Procedure Laterality Date   HERNIA REPAIR     testicular torsion      There were no vitals filed for this visit.   Subjective Assessment - 11/12/19 1705    Subjective Pt reports that he is doing okay. Came in on rollator today. Reports he uses at work.    Patient is accompained by: Family member   Wife - [name]   Pertinent History Hypertension    Limitations Walking;Standing;House hold activities    Patient Stated Goals Being able to walk and stand    Currently in Pain? No/denies                             [facility] Adult PT Treatment/Exercise - 11/12/19 1706      Ambulation/Gait   Ambulation/Gait Yes    Ambulation/Gait Assistance 5: Supervision    Ambulation/Gait Assistance Details in to clinic with rollator. PT did cue to keep rollator close and pt reports that he keeps hands on breaks for safety. Moves very fast with rollator. In clinic ambulated between activities without with wide BOS and slower cadence.    Assistive device Rollator;None    Gait Pattern Decreased step length - right;Decreased step  length - left;Decreased arm swing - right;Decreased arm swing - left;Ataxic    Ambulation Surface Level;Indoor      Standardized Balance Assessment   Standardized Balance Assessment Berg Balance Test      Berg Balance Test   Sit to Stand Able to stand  independently using hands    Standing Unsupported Able to stand safely 2 minutes    Sitting with Back Unsupported but Feet Supported on Floor or Stool Able to sit safely and securely 2 minutes    Stand to Sit Controls descent by using hands    Transfers Able to transfer safely, definite need of hands    Standing Unsupported with Eyes Closed Able to stand 10 seconds with supervision    Standing Ubsupported with Feet Together Able to place feet together independently and stand for 1 minute with supervision    From Standing, Reach Forward with Outstretched Arm Can reach confidently >25 cm (10")    From Standing Position, Pick up Object from Floor Able to pick up shoe safely and easily    From Standing Position, Turn to Look Behind Over each Shoulder Needs supervision when turning    Turn 360 Degrees Needs close supervision or verbal cueing  Standing Unsupported, Alternately Place Feet on Step/Stool Able to complete >2 steps/needs minimal assist    Standing Unsupported, One Foot in Front Able to plae foot ahead of the other independently and hold 30 seconds    Standing on One Leg Tries to lift leg/unable to hold 3 seconds but remains standing independently    Total Score 38      Neuro Re-ed    Neuro Re-ed Details  At counter: side stepping without UE support 6' x 6 with verbal cues to keep feet straight, marching gait forwards and then backwards steps 6' x 6 with verbal cues to slow down and try to increase movements. Pt staggering with posterior steps especially on left. Alternating toe taps on cone with 1 UE support x 10. In corner: standing with feet together x 30 sec eyes open then adding in head turns x 10 left/right then staggered stance x  30 sec each position. Pt had increased sway with head turns.                  PT Education - 11/12/19 1838    Education Details Issued initial balane HEP. Discussed results of Berg testing. Discussed aquatic PT option and pt agreeable to try.    Person(s) Educated Patient    Methods Explanation;Demonstration;Handout    Comprehension Verbalized understanding;Returned demonstration            PT Short Term Goals - 11/12/19 1838      PT SHORT TERM GOAL #1   Title Patient will be independent with Initial HEP for balance/strength (ALL STGs: 12/05/2019)    Baseline no HEP established    Time 4    Period Weeks    Status New    Target Date 12/05/19      PT SHORT TERM GOAL #2   Title Patient will undergo further balance assessment with BERG Balance and LTG to be set as appropriate    Baseline Berg performed on 11/12/19 visit    Time 4    Period Weeks    Status Achieved      PT SHORT TERM GOAL #3   Title Patient will demo ability to complete 5x sit <> stand in </= 20 seconds with UE support to demo improved functional strength/mobility    Baseline 23.84 secs    Time 4    Period Weeks    Status New      PT SHORT TERM GOAL #4   Title Patient will demo ability to complete TUG </= 15 seconds to demonstrate reduced fall risk    Baseline 16.75    Time 4    Period Weeks    Status New             PT Long Term Goals - 11/12/19 1839      PT LONG TERM GOAL #1   Title Patient will be independent with final HEP for balance/strengthening (ALL LTGs Due: 01/02/20)    Baseline no HEP established    Time 8    Period Weeks    Status New      PT LONG TERM GOAL #2   Title Pt will increase Berg score from 38/56 to >42/56 for improved balance and decreased fall risk.    Baseline 11/12/19 38/56    Time 8    Period Weeks    Status New      PT LONG TERM GOAL #3   Title Patient will improve TUG </= 12 seconds to demonstrate reduced fall risk  Baseline 16.75 secs    Time 8     Period Weeks    Status New      PT LONG TERM GOAL #4   Title Patient will improve 5x sit<> stand to </= 15 seconds with UE to demo improved functional mobility    Baseline 23.84 secs    Time 8    Period Weeks    Status New      PT LONG TERM GOAL #5   Title Patient will improve gait speed to >/= 2.5 ft/sec with LRAD to demo improved community ambulation    Baseline 1.86 ft/sec    Time 8    Period Weeks    Status New      PT LONG TERM GOAL #6   Title Patient will demo ability to ascend/descend 12 stairs, alternating pattern, with supervision and single hand rail to allow for improved functional mobility    Baseline CGA and bilateral rails    Time 8    Period Weeks    Status New                 Plan - 11/12/19 1840    Clinical Impression Statement Pt is high fall risk based on Berg score of 38/56. Most challenged by dynamic activities and narrow BOS activities. Focused on establishing initial balance HEP.    Personal Factors and Comorbidities Comorbidity 1;Time since onset of injury/illness/exacerbation    Comorbidities HTN    Examination-Activity Limitations Bend;Carry;Caring for Others;Stairs;Stand;Locomotion Level;Squat;Transfers    Examination-Participation Restrictions Occupation;Community Activity;Yard Work    Conservation officer, historic buildings Evolving/Moderate complexity    Rehab Potential Fair    PT Frequency 2x / week    PT Duration 8 weeks    PT Treatment/Interventions ADLs/Self Care Home Management;Aquatic Therapy;Electrical Stimulation;Moist Heat;Contrast Bath;DME Instruction;Gait training;Functional mobility training;Neuromuscular re-education;Stair training;Therapeutic activities;Patient/family education;Therapeutic exercise;Balance training;Orthotic Fit/Training;Manual techniques;Passive range of motion    PT Next Visit Plan Did we get OT referral. Pt is open to trying aquatic PT so lets get referral done. How did initial HEP go. Continue to work on gait,  balance and coordination activities.    Consulted and Agree with Plan of Care Patient    Family Member Consulted --           Patient will benefit from skilled therapeutic intervention in order to improve the following deficits and impairments:  Abnormal gait, Decreased coordination, Difficulty walking, Decreased safety awareness, Decreased activity tolerance, Impaired sensation, Postural dysfunction, Decreased strength, Decreased mobility, Decreased balance, Decreased knowledge of use of DME, Decreased endurance  Visit Diagnosis: Muscle weakness (generalized)  Other abnormalities of gait and mobility  Unsteadiness on feet     Problem List Patient Active Problem List   Diagnosis Date Noted   Anterior dislocation of right shoulder 03/01/2016    Ronn Melena, PT, DPT, NCS 11/12/2019, 6:43 PM  [facility] [address] [place], [place], [address] Phone: [phone]   Fax:  [phone]  Name: Samuel Stanley MRN: [id number] Date of Birth: 01/03/1991

## 2019-11-12 NOTE — Patient Instructions (Signed)
Access Code: [email or web address] URL: [email or web address] Date: 11/12/2019 Prepared by: Elmer Bales  Exercises Side Stepping with Counter Support - 2 x daily - 7 x weekly - 1 sets - 3-4 reps Alternating tapping cup at counter - 1 x daily - 7 x weekly - 3 sets - 10 reps Standing Romberg to 1/2 Tandem Stance - 1 x daily - 7 x weekly - 1 sets - 3 reps - 30 sec hold standing eyes open feet together - 1 x daily - 7 x weekly - 1 sets - 3 reps - 30 sec hold Standing with Head Rotation - 1 x daily - 7 x weekly - 1 sets - 3 reps - 30 sec hold

## 2019-11-18 ENCOUNTER — Other Ambulatory Visit: Payer: Self-pay

## 2019-11-18 ENCOUNTER — Ambulatory Visit: Payer: Commercial Managed Care - PPO

## 2019-11-18 DIAGNOSIS — M6281 Muscle weakness (generalized): Secondary | ICD-10-CM

## 2019-11-18 DIAGNOSIS — R2689 Other abnormalities of gait and mobility: Secondary | ICD-10-CM

## 2019-11-18 DIAGNOSIS — R2681 Unsteadiness on feet: Secondary | ICD-10-CM

## 2019-11-18 NOTE — Therapy (Signed)
[facility] [address] [place], [place], [address] Phone: [phone]   Fax:  [phone]  Physical Therapy Treatment  Patient Details  Name: Samuel Stanley MRN: [id number] Date of Birth: [birth year] Referring Provider (PT): Francine Graven, DO   Encounter Date: 11/18/2019   PT End of Session - 11/18/19 1450    Visit Number 3    Number of Visits 17    Date for PT Re-Evaluation 02/05/20   POC for 8 weeks, Cert for 90 days   Authorization Type [employer]    PT Start Time 1447    PT Stop Time 1527    PT Time Calculation (min) 40 min    Equipment Utilized During Treatment Gait belt    Activity Tolerance Patient tolerated treatment well    Behavior During Therapy WFL for tasks assessed/performed           Past Medical History:  Diagnosis Date  . Hypertension     Past Surgical History:  Procedure Laterality Date  . HERNIA REPAIR    . testicular torsion      There were no vitals filed for this visit.   Subjective Assessment - 11/18/19 1450    Subjective Pt reports he is doing well. He is trying to incorporate his exercises in to his work with holding less to counter.    Patient is accompained by: Family member   Wife - [name]   Pertinent History Hypertension    Limitations Walking;Standing;House hold activities    Patient Stated Goals Being able to walk and stand    Currently in Pain? No/denies                             [facility] Adult PT Treatment/Exercise - 11/18/19 1451      Ambulation/Gait   Ambulation/Gait Yes    Ambulation/Gait Assistance 5: Supervision    Ambulation/Gait Assistance Details Pt was cued to try to increase step length. Has wide BOS. Weight shifted back on heels    Ambulation Distance (Feet) 345 Feet    Assistive device None    Gait Pattern Step-through pattern;Decreased step length - right;Decreased step length - left;Ataxic;Wide base of support    Ambulation  Surface Level;Indoor      Neuro Re-ed    Neuro Re-ed Details  In // bars: marching gait 6' x 6 with light BUE support working on slow controlled movements, side stepping 6' x 6 without UE support, tandem gait 6' x 6 with 1 UE support. Standing feet together x 30 sec then adding in head turns left/right and up/down. Less stabiltity with head turns left/right. Standing on rockerboard positioned ant/post maintaining level  30 sec x 2 CGA, then rocking board ant/post x 10 with light UE support. Sit to stand  x 10 from mat with airex under feet with occasional light UE support  CGA.  Verbal cues to reach forward to help with rising. Was using back of legs to stabilize against mat. Pt was cued to take his time and work on control with all activities.                  PT Education - 11/18/19 1537    Education Details Pt to continue with current HEP    Person(s) Educated Patient    Methods Explanation    Comprehension Verbalized understanding            PT Short Term Goals -  11/12/19 1838      PT SHORT TERM GOAL #1   Title Patient will be independent with Initial HEP for balance/strength (ALL STGs: 12/05/2019)    Baseline no HEP established    Time 4    Period Weeks    Status New    Target Date 12/05/19      PT SHORT TERM GOAL #2   Title Patient will undergo further balance assessment with BERG Balance and LTG to be set as appropriate    Baseline Berg performed on 11/12/19 visit    Time 4    Period Weeks    Status Achieved      PT SHORT TERM GOAL #3   Title Patient will demo ability to complete 5x sit <> stand in </= 20 seconds with UE support to demo improved functional strength/mobility    Baseline 23.84 secs    Time 4    Period Weeks    Status New      PT SHORT TERM GOAL #4   Title Patient will demo ability to complete TUG </= 15 seconds to demonstrate reduced fall risk    Baseline 16.75    Time 4    Period Weeks    Status New             PT Long Term Goals -  11/12/19 1839      PT LONG TERM GOAL #1   Title Patient will be independent with final HEP for balance/strengthening (ALL LTGs Due: 01/02/20)    Baseline no HEP established    Time 8    Period Weeks    Status New      PT LONG TERM GOAL #2   Title Pt will increase Berg score from 38/56 to >42/56 for improved balance and decreased fall risk.    Baseline 11/12/19 38/56    Time 8    Period Weeks    Status New      PT LONG TERM GOAL #3   Title Patient will improve TUG </= 12 seconds to demonstrate reduced fall risk    Baseline 16.75 secs    Time 8    Period Weeks    Status New      PT LONG TERM GOAL #4   Title Patient will improve 5x sit<> stand to </= 15 seconds with UE to demo improved functional mobility    Baseline 23.84 secs    Time 8    Period Weeks    Status New      PT LONG TERM GOAL #5   Title Patient will improve gait speed to >/= 2.5 ft/sec with LRAD to demo improved community ambulation    Baseline 1.86 ft/sec    Time 8    Period Weeks    Status New      PT LONG TERM GOAL #6   Title Patient will demo ability to ascend/descend 12 stairs, alternating pattern, with supervision and single hand rail to allow for improved functional mobility    Baseline CGA and bilateral rails    Time 8    Period Weeks    Status New                 Plan - 11/18/19 1537    Clinical Impression Statement PT continued to focus on balance and coordination activitities. Pt challenged with adding dynamic tasks. Did well with more static tasks.    Personal Factors and Comorbidities Comorbidity 1;Time since onset of injury/illness/exacerbation    Comorbidities HTN  Examination-Activity Limitations Bend;Carry;Caring for Others;Stairs;Stand;Locomotion Level;Squat;Transfers    Examination-Participation Restrictions Occupation;Community Activity;Yard Work    Conservation officer, historic buildings Evolving/Moderate complexity    Rehab Potential Fair    PT Frequency 2x / week    PT  Duration 8 weeks    PT Treatment/Interventions ADLs/Self Care Home Management;Aquatic Therapy;Electrical Stimulation;Moist Heat;Contrast Bath;DME Instruction;Gait training;Functional mobility training;Neuromuscular re-education;Stair training;Therapeutic activities;Patient/family education;Therapeutic exercise;Balance training;Orthotic Fit/Training;Manual techniques;Passive range of motion    PT Next Visit Plan Did we get OT referral. Aquatic PT referral has been submitted. Continue to work on gait, balance and coordination activities.    Consulted and Agree with Plan of Care Patient           Patient will benefit from skilled therapeutic intervention in order to improve the following deficits and impairments:  Abnormal gait, Decreased coordination, Difficulty walking, Decreased safety awareness, Decreased activity tolerance, Impaired sensation, Postural dysfunction, Decreased strength, Decreased mobility, Decreased balance, Decreased knowledge of use of DME, Decreased endurance  Visit Diagnosis: Muscle weakness (generalized)  Unsteadiness on feet  Other abnormalities of gait and mobility     Problem List Patient Active Problem List   Diagnosis Date Noted  . Anterior dislocation of right shoulder 03/01/2016    Ronn Melena, PT, DPT, NCS 11/18/2019, 3:39 PM  [facility] [address] [place], [place], [address] Phone: [phone]   Fax:  [phone]  Name: Samuel Stanley MRN: [id number] Date of Birth: 09/05/1990

## 2019-11-25 ENCOUNTER — Ambulatory Visit: Payer: Commercial Managed Care - PPO

## 2019-11-25 ENCOUNTER — Other Ambulatory Visit: Payer: Self-pay

## 2019-11-25 DIAGNOSIS — M6281 Muscle weakness (generalized): Secondary | ICD-10-CM

## 2019-11-25 DIAGNOSIS — R2681 Unsteadiness on feet: Secondary | ICD-10-CM

## 2019-11-25 DIAGNOSIS — R2689 Other abnormalities of gait and mobility: Secondary | ICD-10-CM

## 2019-11-25 NOTE — Therapy (Signed)
[facility] [address] [place], [place], [address] Phone: [phone]   Fax:  [phone]  Physical Therapy Treatment  Patient Details  Name: Samuel Stanley MRN: [id number] Date of Birth: 17-Jun-1990 Referring Provider (PT): Francine Graven, DO   Encounter Date: 11/25/2019   PT End of Session - 11/25/19 1706    Visit Number 4    Number of Visits 17    Date for PT Re-Evaluation 02/05/20   POC for 8 weeks, Cert for 90 days   Authorization Type [employer]    PT Start Time 1702    PT Stop Time 1744    PT Time Calculation (min) 42 min    Equipment Utilized During Treatment Gait belt    Activity Tolerance Patient tolerated treatment well    Behavior During Therapy WFL for tasks assessed/performed           Past Medical History:  Diagnosis Date   Hypertension     Past Surgical History:  Procedure Laterality Date   HERNIA REPAIR     testicular torsion      There were no vitals filed for this visit.   Subjective Assessment - 11/25/19 1704    Subjective Patient reports no new changes/complaint since last visit. Reports that his exercises are going well. Reports that appointment with MD went well. No pain. No falls since last visit.    Patient is accompained by: Family member   Wife - Samuel Stanley   Pertinent History Hypertension    Limitations Walking;Standing;House hold activities    Patient Stated Goals Being able to walk and stand    Currently in Pain? No/denies                             [facility] Adult PT Treatment/Exercise - 11/25/19 0001      Transfers   Transfers Sit to Stand;Stand to Sit    Sit to Stand 5: Supervision    Stand to Sit 5: Supervision    Comments completed x 10 reps without UE support from mat. verbal cues for eccentric control. patient demo improved ability to complete sit <> stand without UE support today.       Ambulation/Gait   Ambulation/Gait Yes     Ambulation/Gait Assistance 5: Supervision;4: Min guard    Ambulation/Gait Assistance Details ambulating into/out of clinic with rollator. Completed gait training during session without AD. PT provided verbal cues for increased step length. CGA intermittently as needed     Ambulation Distance (Feet) 345 Feet    Assistive device None    Gait Pattern Step-through pattern;Decreased step length - right;Decreased step length - left;Ataxic;Wide base of support    Ambulation Surface Level;Indoor      High Level Balance   High Level Balance Activities Marching forwards;Negotiating over obstacles    High Level Balance Comments Completed marching forward at countertop with single UE support x 3 laps, down and back. verbal cues for improved form. At countertop completed reciprocal stepping over orange hurdles x 4 laps, down and back with light UE support. CGA throughout, 1-2 instances of balance challenged noted with negotiating over obstacles.       Neuro Re-ed    Neuro Re-ed Details  seated on green air disc in chair completed the following exercises without UE support: alternating marching x 10 reps. PT providing tactile/verbal cues for improved posture. Followed by completion of alternating LAQ, x 10 reps bilaterally. continued verbal cues  for posture and using core for stability.                Balance Exercises - 11/25/19 0001      Balance Exercises: Standing   Standing Eyes Opened Narrow base of support (BOS);Foam/compliant surface;Limitations;Head turns    Standing Eyes Opened Limitations with narrow BOS on firm surface, completed horizontal/vertical head turns 1 x 10 reps each. progressed to completing with feet shoulder width apart on airex, horizontal/vertical head turns 2 x 10 reps each. increased difficutly on complaint surfaces.     Standing Eyes Closed Wide (BOA);Foam/compliant surface;3 reps;30 secs;Limitations    Standing Eyes Closed Limitations with narrow BOS on firm surface,  compelted EC 3 x 30 seconds. progressed to airex completed with feet shoulder width apart EC 3 x 20-30 seconds. increased sway and balance challenge noted on foam.     Gait with Head Turns Forward;Intermittent upper extremity support;4 reps;Limitations    Gait with Head Turns Limitations at countertop with light single UE support, completed horizontal head turns every 2-3 steps. verbal cues for improvedd completion. attempted without UE support but decreased step length and imbalance noted requiring CGA from PT.                PT Short Term Goals - 11/12/19 1838      PT SHORT TERM GOAL #1   Title Patient will be independent with Initial HEP for balance/strength (ALL STGs: 12/05/2019)    Baseline no HEP established    Time 4    Period Weeks    Status New    Target Date 12/05/19      PT SHORT TERM GOAL #2   Title Patient will undergo further balance assessment with BERG Balance and LTG to be set as appropriate    Baseline Berg performed on 11/12/19 visit    Time 4    Period Weeks    Status Achieved      PT SHORT TERM GOAL #3   Title Patient will demo ability to complete 5x sit <> stand in </= 20 seconds with UE support to demo improved functional strength/mobility    Baseline 23.84 secs    Time 4    Period Weeks    Status New      PT SHORT TERM GOAL #4   Title Patient will demo ability to complete TUG </= 15 seconds to demonstrate reduced fall risk    Baseline 16.75    Time 4    Period Weeks    Status New             PT Long Term Goals - 11/12/19 1839      PT LONG TERM GOAL #1   Title Patient will be independent with final HEP for balance/strengthening (ALL LTGs Due: 01/02/20)    Baseline no HEP established    Time 8    Period Weeks    Status New      PT LONG TERM GOAL #2   Title Pt will increase Berg score from 38/56 to >42/56 for improved balance and decreased fall risk.    Baseline 11/12/19 38/56    Time 8    Period Weeks    Status New      PT LONG TERM GOAL  #3   Title Patient will improve TUG </= 12 seconds to demonstrate reduced fall risk    Baseline 16.75 secs    Time 8    Period Weeks    Status New  PT LONG TERM GOAL #4   Title Patient will improve 5x sit<> stand to </= 15 seconds with UE to demo improved functional mobility    Baseline 23.84 secs    Time 8    Period Weeks    Status New      PT LONG TERM GOAL #5   Title Patient will improve gait speed to >/= 2.5 ft/sec with LRAD to demo improved community ambulation    Baseline 1.86 ft/sec    Time 8    Period Weeks    Status New      PT LONG TERM GOAL #6   Title Patient will demo ability to ascend/descend 12 stairs, alternating pattern, with supervision and single hand rail to allow for improved functional mobility    Baseline CGA and bilateral rails    Time 8    Period Weeks    Status New                 Plan - 11/25/19 1802    Clinical Impression Statement Today's skilled PT session included continued activites focused on balance and coordination. Patient continue to demonstrate difficulty with dynamic tasks, and requiring intermittent CGA and light UE support at times. Patient is demonstrating progress with PT services as able to complete sit <> stands without UE support and maintain balance. Will continue to progress toward all goals.    Personal Factors and Comorbidities Comorbidity 1;Time since onset of injury/illness/exacerbation    Comorbidities HTN    Examination-Activity Limitations Bend;Carry;Caring for Others;Stairs;Stand;Locomotion Level;Squat;Transfers    Examination-Participation Restrictions Occupation;Community Activity;Yard Work    Conservation officer, historic buildings Evolving/Moderate complexity    Rehab Potential Fair    PT Frequency 2x / week    PT Duration 8 weeks    PT Treatment/Interventions ADLs/Self Care Home Management;Aquatic Therapy;Electrical Stimulation;Moist Heat;Contrast Bath;DME Instruction;Gait training;Functional mobility  training;Neuromuscular re-education;Stair training;Therapeutic activities;Patient/family education;Therapeutic exercise;Balance training;Orthotic Fit/Training;Manual techniques;Passive range of motion    PT Next Visit Plan Did we get OT referral? Aquatic PT referral has been submitted (any update from suzanne?) Continue to work on gait, balance and coordination activities. Dynamic Seated balance activites (on green air disc)    Consulted and Agree with Plan of Care Patient           Patient will benefit from skilled therapeutic intervention in order to improve the following deficits and impairments:  Abnormal gait, Decreased coordination, Difficulty walking, Decreased safety awareness, Decreased activity tolerance, Impaired sensation, Postural dysfunction, Decreased strength, Decreased mobility, Decreased balance, Decreased knowledge of use of DME, Decreased endurance  Visit Diagnosis: Muscle weakness (generalized)  Unsteadiness on feet  Other abnormalities of gait and mobility     Problem List Patient Active Problem List   Diagnosis Date Noted   Anterior dislocation of right shoulder 03/01/2016    Tempie Donning, PT, DPT 11/25/2019, 6:04 PM  [facility] [address] [place], [place], [address] Phone: [phone]   Fax:  [phone]  Name: Esmond Hinch MRN: [id number] Date of Birth: 07-Nov-1990

## 2019-11-27 ENCOUNTER — Ambulatory Visit: Payer: Commercial Managed Care - PPO

## 2019-11-27 ENCOUNTER — Other Ambulatory Visit: Payer: Self-pay

## 2019-11-27 DIAGNOSIS — R2681 Unsteadiness on feet: Secondary | ICD-10-CM

## 2019-11-27 DIAGNOSIS — M6281 Muscle weakness (generalized): Secondary | ICD-10-CM

## 2019-11-27 DIAGNOSIS — R2689 Other abnormalities of gait and mobility: Secondary | ICD-10-CM

## 2019-11-28 NOTE — Therapy (Signed)
[facility] [address] [place], [place], [address] Phone: [phone]   Fax:  [phone]  Physical Therapy Treatment  Patient Details  Name: Samuel Stanley MRN: [id number] Date of Birth: [birth year] Referring Provider (PT): Francine Graven, DO   Encounter Date: 11/27/2019   PT End of Session - 11/27/19 1618    Visit Number 5    Number of Visits 17    Date for PT Re-Evaluation 02/05/20   POC for 8 weeks, Cert for 90 days   Authorization Type [employer]    PT Start Time 1617    PT Stop Time 1659    PT Time Calculation (min) 42 min    Equipment Utilized During Treatment Gait belt    Activity Tolerance Patient tolerated treatment well    Behavior During Therapy WFL for tasks assessed/performed           Past Medical History:  Diagnosis Date   Hypertension     Past Surgical History:  Procedure Laterality Date   HERNIA REPAIR     testicular torsion      There were no vitals filed for this visit.   Subjective Assessment - 11/27/19 1618    Subjective Pt reports he is doing ok. Just got off work. Wife present today. Pt saw Dr. at [facility] and they did not change anything. They are referring him to a movement specialist in October. They did take a video and are going to show a panel of neurologists.    Patient is accompained by: Family member   Wife - [name]   Pertinent History Hypertension    Limitations Walking;Standing;House hold activities    Patient Stated Goals Being able to walk and stand    Currently in Pain? No/denies                             [facility] Adult PT Treatment/Exercise - 11/27/19 1621      Transfers   Transfers Sit to Stand;Stand to Sit    Sit to Stand 6: Modified independent (Device/Increase time)    Stand to Sit 6: Modified independent (Device/Increase time)      Ambulation/Gait   Ambulation/Gait Yes    Ambulation/Gait Assistance 5: Supervision;4: Min guard     Ambulation/Gait Assistance Details Pt was cued to slow down some and focus on larger steps. Also cued to try to relax left arm    Ambulation Distance (Feet) 460 Feet   115' x 1 after working on reciprocal steps in // bars   Assistive device None    Gait Pattern Step-through pattern;Decreased step length - right;Decreased step length - left;Ataxic;Wide base of support    Ambulation Surface Level;Indoor      Neuro Re-ed    Neuro Re-ed Details  In // bars: Reciprocal steps over 4 cones tapping first then side stepping then reciprocal steps over floor ladder x 4 bouts each. Pt utilized varied UE levels trying to use a little as possible. Initially having more trouble with tapping cone with RLE needing some increased UE support. Standing feet together eyes open x 30 sec then eyes closed x 30 sec CGA then adding in head turns left/right x 10. Standing with reaching across body for 1.1# ball and 2.2# ball and hand across to therapist x 1 min then performed with therapist holding 2.2# ball behind pt and having him reach with both hands to rotate trunk in varied directions x 2 min.  Close SBA/CGA for safety with all activities.                     PT Short Term Goals - 11/12/19 1838      PT SHORT TERM GOAL #1   Title Patient will be independent with Initial HEP for balance/strength (ALL STGs: 12/05/2019)    Baseline no HEP established    Time 4    Period Weeks    Status New    Target Date 12/05/19      PT SHORT TERM GOAL #2   Title Patient will undergo further balance assessment with BERG Balance and LTG to be set as appropriate    Baseline Berg performed on 11/12/19 visit    Time 4    Period Weeks    Status Achieved      PT SHORT TERM GOAL #3   Title Patient will demo ability to complete 5x sit <> stand in </= 20 seconds with UE support to demo improved functional strength/mobility    Baseline 23.84 secs    Time 4    Period Weeks    Status New      PT SHORT TERM GOAL #4   Title  Patient will demo ability to complete TUG </= 15 seconds to demonstrate reduced fall risk    Baseline 16.75    Time 4    Period Weeks    Status New             PT Long Term Goals - 11/12/19 1839      PT LONG TERM GOAL #1   Title Patient will be independent with final HEP for balance/strengthening (ALL LTGs Due: 01/02/20)    Baseline no HEP established    Time 8    Period Weeks    Status New      PT LONG TERM GOAL #2   Title Pt will increase Berg score from 38/56 to >42/56 for improved balance and decreased fall risk.    Baseline 11/12/19 38/56    Time 8    Period Weeks    Status New      PT LONG TERM GOAL #3   Title Patient will improve TUG </= 12 seconds to demonstrate reduced fall risk    Baseline 16.75 secs    Time 8    Period Weeks    Status New      PT LONG TERM GOAL #4   Title Patient will improve 5x sit<> stand to </= 15 seconds with UE to demo improved functional mobility    Baseline 23.84 secs    Time 8    Period Weeks    Status New      PT LONG TERM GOAL #5   Title Patient will improve gait speed to >/= 2.5 ft/sec with LRAD to demo improved community ambulation    Baseline 1.86 ft/sec    Time 8    Period Weeks    Status New      PT LONG TERM GOAL #6   Title Patient will demo ability to ascend/descend 12 stairs, alternating pattern, with supervision and single hand rail to allow for improved functional mobility    Baseline CGA and bilateral rails    Time 8    Period Weeks    Status New                 Plan - 11/28/19 2595    Clinical Impression Statement Pt was able to demonstrate more narrow  BOS with gait after practice with reciprocal stepping and cues to slow down to better control his movements.    Personal Factors and Comorbidities Comorbidity 1;Time since onset of injury/illness/exacerbation    Comorbidities HTN    Examination-Activity Limitations Bend;Carry;Caring for Others;Stairs;Stand;Locomotion Level;Squat;Transfers     Examination-Participation Restrictions Occupation;Community Activity;Yard Work    Conservation officer, historic buildings Evolving/Moderate complexity    Rehab Potential Fair    PT Frequency 2x / week    PT Duration 8 weeks    PT Treatment/Interventions ADLs/Self Care Home Management;Aquatic Therapy;Electrical Stimulation;Moist Heat;Contrast Bath;DME Instruction;Gait training;Functional mobility training;Neuromuscular re-education;Stair training;Therapeutic activities;Patient/family education;Therapeutic exercise;Balance training;Orthotic Fit/Training;Manual techniques;Passive range of motion    PT Next Visit Plan OT paper referral came but wrong diagnosis for OT. Front office will be calling to request change. Aquatic PT referral has been submitted (any update from suzanne?) Continue to work on gait, balance and coordination activities. Dynamic Seated balance activites (on green air disc)    Consulted and Agree with Plan of Care Patient           Patient will benefit from skilled therapeutic intervention in order to improve the following deficits and impairments:  Abnormal gait, Decreased coordination, Difficulty walking, Decreased safety awareness, Decreased activity tolerance, Impaired sensation, Postural dysfunction, Decreased strength, Decreased mobility, Decreased balance, Decreased knowledge of use of DME, Decreased endurance  Visit Diagnosis: Other abnormalities of gait and mobility  Muscle weakness (generalized)  Unsteadiness on feet     Problem List Patient Active Problem List   Diagnosis Date Noted   Anterior dislocation of right shoulder 03/01/2016    Ronn Melena, PT, DPT, NCS 11/28/2019, 7:07 AM  [facility] [address] [place], [place], [address] Phone: [phone]   Fax:  [phone]  Name: Ulrick Methot MRN: [id number] Date of Birth: 1990/06/12

## 2019-12-02 ENCOUNTER — Other Ambulatory Visit: Payer: Self-pay

## 2019-12-02 ENCOUNTER — Ambulatory Visit: Payer: Commercial Managed Care - PPO | Admitting: Speech Pathology

## 2019-12-02 ENCOUNTER — Ambulatory Visit: Payer: Commercial Managed Care - PPO

## 2019-12-02 DIAGNOSIS — R2689 Other abnormalities of gait and mobility: Secondary | ICD-10-CM

## 2019-12-02 DIAGNOSIS — R2681 Unsteadiness on feet: Secondary | ICD-10-CM

## 2019-12-02 DIAGNOSIS — M6281 Muscle weakness (generalized): Secondary | ICD-10-CM | POA: Diagnosis not present

## 2019-12-02 DIAGNOSIS — R260 Ataxic gait: Secondary | ICD-10-CM

## 2019-12-02 NOTE — Patient Instructions (Signed)
   Aquatic Therapy: What to Expect!  Where:  [facility]           NOTE: You will receive an automated phone message [address]           reminding you of your appointment and it will say the  [place], [place]  [address]           appointment is at the Rehab Center on [address] We are  [phone]             working to fix this - just know that you will meet us at                pool!   How to Prepare: . Please make sure you drink 8 ounces of water about one hour prior to your pool session . A caregiver must attend the entire session with the patient (unless your primary therapists feels this is not necessary). The caregiver will be responsible for assisting with dressing as well as any toileting needs.  . Please arrive IN YOUR SUIT and a few minutes prior to your appointment - this helps to avoid delays in starting your session. . Please make sure to attend to any toileting needs prior to entering the pool . Once on the pool deck your therapist will ask you to sign the Patient  Consent and Assignment of Benefits form . Your therapist may take your blood pressure prior to, during and after your session if indicated . We usually try and create a home exercise program based on activities we do in the pool.  Please be thinking about who might be able to assist you in the pool should you want to participate in an aquatic home exercise program at the time of discharge.  Some patients do not want to or do not have the ability to participate in an aquatic home program - this is not a barrier in any way to you participating in aquatic therapy as part of your current therapy plan!    About the pool: 1. Entering the pool Your therapist will assist you; there are multiple ways to enter including stairs with railings, a walk in ramp, a roll in chair and a mechanical lift. Your therapist will determine the most appropriate way for you. 2. Water temperature is usually between 86-87  degrees 3. There may be other swimmers in the pool at the same time     Contact Info:             Appointments: [facility]         All sessions are 45 minutes   [address]  [address]            Please call the [facility] if   [place], [place]  [address]           you need to cancel or reschedule an appointment.  [phone]           Suzanne Dilday, PT    Karen Pulaski, OTR/L    09/03/19 

## 2019-12-02 NOTE — Therapy (Signed)
[facility] [address] [place], [place], [address] Phone: [phone]   Fax:  [phone]  Physical Therapy Treatment  Patient Details  Name: Samuel Stanley MRN: [id number] Date of Birth: 05/29/1990 Referring Provider (PT): Francine Graven, DO   Encounter Date: 12/02/2019   PT End of Session - 12/02/19 1800    Visit Number 6    Number of Visits 17    Date for PT Re-Evaluation 02/05/20   POC for 8 weeks, Cert for 90 days   Authorization Type [employer]    PT Start Time 1702    PT Stop Time 1745    PT Time Calculation (min) 43 min    Equipment Utilized During Treatment Gait belt    Activity Tolerance Patient tolerated treatment well    Behavior During Therapy WFL for tasks assessed/performed           Past Medical History:  Diagnosis Date  . Hypertension     Past Surgical History:  Procedure Laterality Date  . HERNIA REPAIR    . testicular torsion      There were no vitals filed for this visit.   Subjective Assessment - 12/02/19 1706    Subjective Patient reports no new changes since last visit. No falls. Reported did not bring rollator today due to it being wet outside. No pain.    Patient is accompained by: Family member   Wife - [name]   Pertinent History Hypertension    Limitations Walking;Standing;House hold activities    Patient Stated Goals Being able to walk and stand    Currently in Pain? No/denies                             [facility] Adult PT Treatment/Exercise - 12/02/19 0001      Ambulation/Gait   Ambulation/Gait Yes    Ambulation/Gait Assistance 5: Supervision;4: Min guard    Ambulation/Gait Assistance Details Completed gait training wihtout AD and working on improved step length throughout gait. Patient had one instance of increased cadence and decreased step length but able to correct with verbal cues from PT.     Ambulation Distance (Feet) 450 Feet    Assistive  device None    Gait Pattern Step-through pattern;Decreased step length - right;Decreased step length - left;Ataxic;Wide base of support    Ambulation Surface Level;Indoor      Self-Care   Self-Care Other Self-Care Comments    Other Self-Care Comments  Spoke to patient regarding aquatic therapy and determine if he would like to be scheduled, with patient verbalizing that he would. PT educating on what to expect with aquatic therapy and provided with handout.       Neuro Re-ed    Neuro Re-ed Details  In tall kneeling on mat: completed diagonal lifts/chops with 1# weighted ball x 10 reps in one direction, followed by x 10 reps in opposite direction. PT providing faciliation at pelvis and verbal cues for improved posture, one instance of imbalance noted requiring Min A from PT. At countertop standing on blue airex without UE support completed bean bag toss 2 x 10 bags to target, completed throwing with LUE. Increased balance challenge noted with PT providing verbal cues for upright posture. Continued balance on airex pad completing alteranting toe taps to pebbles x 15 reps, initially with UE support and progressing to no UE support. increased challenge noted without UE support.  Balance Exercises - 12/02/19 0001      Balance Exercises: Standing   SLS Eyes open;Eyes closed;Upper extremity support 1;3 reps;Time;Limitations    SLS Time 15-20 secs    SLS Limitations worked on progressing SLS with single UE support, completed x 3 reps alternating BLE.              PT Education - 12/02/19 1759    Education Details PT Educated on What to Expect with Aquatic Therapy    Person(s) Educated Patient    Methods Explanation;Handout    Comprehension Verbalized understanding            PT Short Term Goals - 11/12/19 1838      PT SHORT TERM GOAL #1   Title Patient will be independent with Initial HEP for balance/strength (ALL STGs: 12/05/2019)    Baseline no HEP established     Time 4    Period Weeks    Status New    Target Date 12/05/19      PT SHORT TERM GOAL #2   Title Patient will undergo further balance assessment with BERG Balance and LTG to be set as appropriate    Baseline Berg performed on 11/12/19 visit    Time 4    Period Weeks    Status Achieved      PT SHORT TERM GOAL #3   Title Patient will demo ability to complete 5x sit <> stand in </= 20 seconds with UE support to demo improved functional strength/mobility    Baseline 23.84 secs    Time 4    Period Weeks    Status New      PT SHORT TERM GOAL #4   Title Patient will demo ability to complete TUG </= 15 seconds to demonstrate reduced fall risk    Baseline 16.75    Time 4    Period Weeks    Status New             PT Long Term Goals - 11/12/19 1839      PT LONG TERM GOAL #1   Title Patient will be independent with final HEP for balance/strengthening (ALL LTGs Due: 01/02/20)    Baseline no HEP established    Time 8    Period Weeks    Status New      PT LONG TERM GOAL #2   Title Pt will increase Berg score from 38/56 to >42/56 for improved balance and decreased fall risk.    Baseline 11/12/19 38/56    Time 8    Period Weeks    Status New      PT LONG TERM GOAL #3   Title Patient will improve TUG </= 12 seconds to demonstrate reduced fall risk    Baseline 16.75 secs    Time 8    Period Weeks    Status New      PT LONG TERM GOAL #4   Title Patient will improve 5x sit<> stand to </= 15 seconds with UE to demo improved functional mobility    Baseline 23.84 secs    Time 8    Period Weeks    Status New      PT LONG TERM GOAL #5   Title Patient will improve gait speed to >/= 2.5 ft/sec with LRAD to demo improved community ambulation    Baseline 1.86 ft/sec    Time 8    Period Weeks    Status New      PT LONG TERM GOAL #  6   Title Patient will demo ability to ascend/descend 12 stairs, alternating pattern, with supervision and single hand rail to allow for improved functional  mobility    Baseline CGA and bilateral rails    Time 8    Period Weeks    Status New                 Plan - 12/02/19 1842    Clinical Impression Statement Continued gait training with focus on improved step length and balance with ambulation. Continued balance activites with challenge on complaint surfaces. Initiated trunk control activites today in tall kneeling with patient tolerating well. Will continue to progress toward goals.    Personal Factors and Comorbidities Comorbidity 1;Time since onset of injury/illness/exacerbation    Comorbidities HTN    Examination-Activity Limitations Bend;Carry;Caring for Others;Stairs;Stand;Locomotion Level;Squat;Transfers    Examination-Participation Restrictions Occupation;Community Activity;Yard Work    Conservation officer, historic buildings Evolving/Moderate complexity    Rehab Potential Fair    PT Frequency 2x / week    PT Duration 8 weeks    PT Treatment/Interventions ADLs/Self Care Home Management;Aquatic Therapy;Electrical Stimulation;Moist Heat;Contrast Bath;DME Instruction;Gait training;Functional mobility training;Neuromuscular re-education;Stair training;Therapeutic activities;Patient/family education;Therapeutic exercise;Balance training;Orthotic Fit/Training;Manual techniques;Passive range of motion    PT Next Visit Plan Check STGs.  Continue to work on gait, balance and coordination activities. Dynamic Seated balance activites (on green air disc)    Consulted and Agree with Plan of Care Patient           Patient will benefit from skilled therapeutic intervention in order to improve the following deficits and impairments:  Abnormal gait, Decreased coordination, Difficulty walking, Decreased safety awareness, Decreased activity tolerance, Impaired sensation, Postural dysfunction, Decreased strength, Decreased mobility, Decreased balance, Decreased knowledge of use of DME, Decreased endurance  Visit Diagnosis: Other abnormalities of  gait and mobility  Muscle weakness (generalized)  Unsteadiness on feet  Ataxic gait     Problem List Patient Active Problem List   Diagnosis Date Noted  . Anterior dislocation of right shoulder 03/01/2016    Tempie Donning, PT, DPT 12/02/2019, 6:45 PM  [facility] [address] [place], [place], [address] Phone: [phone]   Fax:  [phone]  Name: Samuel Stanley MRN: [id number] Date of Birth: 1990-04-23

## 2019-12-04 ENCOUNTER — Ambulatory Visit: Payer: Commercial Managed Care - PPO | Admitting: Speech Pathology

## 2019-12-04 ENCOUNTER — Ambulatory Visit: Payer: Commercial Managed Care - PPO

## 2019-12-04 ENCOUNTER — Other Ambulatory Visit: Payer: Self-pay

## 2019-12-04 DIAGNOSIS — M6281 Muscle weakness (generalized): Secondary | ICD-10-CM | POA: Diagnosis not present

## 2019-12-04 DIAGNOSIS — R260 Ataxic gait: Secondary | ICD-10-CM

## 2019-12-04 DIAGNOSIS — R471 Dysarthria and anarthria: Secondary | ICD-10-CM

## 2019-12-04 DIAGNOSIS — R2681 Unsteadiness on feet: Secondary | ICD-10-CM

## 2019-12-04 DIAGNOSIS — R2689 Other abnormalities of gait and mobility: Secondary | ICD-10-CM

## 2019-12-04 NOTE — Therapy (Signed)
[facility] [address] [place], [place], [address] Phone: [phone]   Fax:  [phone]  Physical Therapy Treatment  Patient Details  Name: Samuel Stanley MRN: [id number] Date of Birth: April 26, 1990 Referring Provider (PT): Francine Graven, DO   Encounter Date: 12/04/2019   PT End of Session - 12/04/19 1707    Visit Number 7    Number of Visits 17    Date for PT Re-Evaluation 02/05/20   POC for 8 weeks, Cert for 90 days   Authorization Type [employer]    PT Start Time 1701    PT Stop Time 1742    PT Time Calculation (min) 41 min    Equipment Utilized During Treatment Gait belt    Activity Tolerance Patient tolerated treatment well    Behavior During Therapy [facility] for tasks assessed/performed           Past Medical History:  Diagnosis Date  . Hypertension     Past Surgical History:  Procedure Laterality Date  . HERNIA REPAIR    . testicular torsion      There were no vitals filed for this visit.   Subjective Assessment - 12/04/19 1705    Subjective Patient reports no new changes/complaints since last visit. No falls, no stumbles. Excited to get in the pool.    Patient is accompained by: Family member   Wife - [name]   Pertinent History Hypertension    Limitations Walking;Standing;House hold activities    Patient Stated Goals Being able to walk and stand    Currently in Pain? No/denies                             [facility] Adult PT Treatment/Exercise - 12/04/19 0001      Transfers   Transfers Sit to Stand;Stand to Sit    Sit to Stand 6: Modified independent (Device/Increase time)    Five time sit to stand comments  22.38 secs w/o UE support; 18.65 secs w/ UE support from standard height chair.     Stand to Sit 6: Modified independent (Device/Increase time)      Ambulation/Gait   Ambulation/Gait Yes    Ambulation/Gait Assistance 5: Supervision;4: Min guard    Ambulation/Gait  Assistance Details Patient ambulated into/out of session without AD, as did not bring rollator today. Completed gait throughout session with activities completed, bouts 3 x 50' with patient demo improved step length and control with ambulation.     Ambulation Distance (Feet) --   clinic distances   Assistive device None    Gait Pattern Step-through pattern;Decreased step length - right;Decreased step length - left;Ataxic;Wide base of support    Ambulation Surface Level;Indoor      Standardized Balance Assessment   Standardized Balance Assessment Timed Up and Go Test      Timed Up and Go Test   TUG Normal TUG    Normal TUG (seconds) 13   w/o AD     Neuro Re-ed    Neuro Re-ed Details  Completed coordination task: including ball toss to self with feet shoulder width apart x 10 reps, progressed to narrow BOS x 10 reps. Patient demo decreased coordination in BUE with completion, however no balance challenge noted. Progresed balance challenge onto airex pad, completing self ball toss with wide BOS, increased challenge noted requiring Min A due to LOB once and intermittent UE support  Balance Exercises - 12/04/19 0001      Balance Exercises: Standing   Balance Beam standing across blue balance beam static stance with eyes open, 2 x 1 minute without UE support. progressed to completing stance with wide BOS completed horizontal/vertical head turns 1 x 10 reps each. increaed balance challenged noted with horizontal head turns.     Sidestepping Foam/compliant support;Upper extremity support;3 reps;Limitations    Sidestepping Limitations completed side stepping down and back balance beam x 4 reps with intermittent UE support, verbal cues for step length needed and slowed pace. patient demo wide BOS upon stance on balance beam, requiring verbal cues for narrow BOS for further balance challenge and improved technique    Step Over Hurdles / Cones completed ambulation  with stepping over  cones with added toe tap to cone to further promote SLS, patient require single UE support for completion, completed x 3 laps, down and back in // bars.     Other Standing Exercises standing on large rockerboard with staggered stance, completed diagonal weight shift with BUE support x 10 reps, progressed to alternating feet position and followed with x 10 reps opposite direction. Verbal cues for improved weight shift.              PT Education - 12/04/19 1910    Education Details Reminded patient of Aquatic Appt at [facility] on Monday    Person(s) Educated Patient    Methods Explanation    Comprehension Verbalized understanding            PT Short Term Goals - 12/04/19 1708      PT SHORT TERM GOAL #1   Title Patient will be independent with Initial HEP for balance/strength (ALL STGs: 12/05/2019)    Baseline Patient reports independence with HEP, and completing daily    Time 4    Period Weeks    Status Achieved    Target Date 12/05/19      PT SHORT TERM GOAL #2   Title Patient will undergo further balance assessment with BERG Balance and LTG to be set as appropriate    Baseline Berg performed on 11/12/19 visit    Time 4    Period Weeks    Status Achieved      PT SHORT TERM GOAL #3   Title Patient will demo ability to complete 5x sit <> stand in </= 20 seconds with UE support to demo improved functional strength/mobility    Baseline 23.84 secs, 18.65 secs w/ UE support    Time 4    Period Weeks    Status Achieved      PT SHORT TERM GOAL #4   Title Patient will demo ability to complete TUG </= 15 seconds to demonstrate reduced fall risk    Baseline 16.75, 13 secs w/o AD    Time 4    Period Weeks    Status Achieved             PT Long Term Goals - 11/12/19 1839      PT LONG TERM GOAL #1   Title Patient will be independent with final HEP for balance/strengthening (ALL LTGs Due: 01/02/20)    Baseline no HEP established    Time 8    Period Weeks    Status New      PT  LONG TERM GOAL #2   Title Pt will increase Berg score from 38/56 to >42/56 for improved balance and decreased fall risk.    Baseline 11/12/19 38/56  Time 8    Period Weeks    Status New      PT LONG TERM GOAL #3   Title Patient will improve TUG </= 12 seconds to demonstrate reduced fall risk    Baseline 16.75 secs    Time 8    Period Weeks    Status New      PT LONG TERM GOAL #4   Title Patient will improve 5x sit<> stand to </= 15 seconds with UE to demo improved functional mobility    Baseline 23.84 secs    Time 8    Period Weeks    Status New      PT LONG TERM GOAL #5   Title Patient will improve gait speed to >/= 2.5 ft/sec with LRAD to demo improved community ambulation    Baseline 1.86 ft/sec    Time 8    Period Weeks    Status New      PT LONG TERM GOAL #6   Title Patient will demo ability to ascend/descend 12 stairs, alternating pattern, with supervision and single hand rail to allow for improved functional mobility    Baseline CGA and bilateral rails    Time 8    Period Weeks    Status New                 Plan - 12/04/19 1742    Clinical Impression Statement Today's skilled PT session included assessment of patient's progress toward all STGs. Patient able to meet all STG today during session demonstrating improvements with therapy services. Continued balance and coordination exercises as tolerated by patient. Will continue to progress toward all LTGs.    Personal Factors and Comorbidities Comorbidity 1;Time since onset of injury/illness/exacerbation    Comorbidities HTN    Examination-Activity Limitations Bend;Carry;Caring for Others;Stairs;Stand;Locomotion Level;Squat;Transfers    Examination-Participation Restrictions Occupation;Community Activity;Yard Work    Conservation officer, historic buildings Evolving/Moderate complexity    Rehab Potential Fair    PT Frequency 2x / week    PT Duration 8 weeks    PT Treatment/Interventions ADLs/Self Care Home  Management;Aquatic Therapy;Electrical Stimulation;Moist Heat;Contrast Bath;DME Instruction;Gait training;Functional mobility training;Neuromuscular re-education;Stair training;Therapeutic activities;Patient/family education;Therapeutic exercise;Balance training;Orthotic Fit/Training;Manual techniques;Passive range of motion    PT Next Visit Plan How was Pool Visit? Continue to work on gait, balance and coordination activities. Dynamic Seated balance activites (on green air disc)    Consulted and Agree with Plan of Care Patient           Patient will benefit from skilled therapeutic intervention in order to improve the following deficits and impairments:  Abnormal gait, Decreased coordination, Difficulty walking, Decreased safety awareness, Decreased activity tolerance, Impaired sensation, Postural dysfunction, Decreased strength, Decreased mobility, Decreased balance, Decreased knowledge of use of DME, Decreased endurance  Visit Diagnosis: Other abnormalities of gait and mobility  Muscle weakness (generalized)  Unsteadiness on feet  Ataxic gait     Problem List Patient Active Problem List   Diagnosis Date Noted  . Anterior dislocation of right shoulder 03/01/2016    Tempie Donning, PT, DPT 12/04/2019, 7:12 PM  [facility] [address] [place], [place], [address] Phone: [phone]   Fax:  [phone]  Name: Pilar Corrales MRN: [id number] Date of Birth: Sep 26, 1990

## 2019-12-04 NOTE — Patient Instructions (Signed)
When you talk, think about doing these 4 things - we will focus our sessions on finding out which few work the best for you and get you to concentrate on those. Thing "SLOP"   Talk SLOW Talk LOUD Over-enunciate, ("Talk big", "open your mouth wider") PAUSE between your words  Practice these everyday, twice a day. Add some more things to the list that you might say at work or home  We have to make tubes We need 55s We need 65s We need triangle boxes We need ones and twos This one had no shipment found Rosalyn Gess [name] Do you want to watch Mickey Mouse?     Speech Exercises  Repeat each 3 times, 2 times a day  Call the cat Buttercup A calendar of [place] Four floors to cover Yellow oil ointment Fellow lovers of felines Catastrophe in [place] Plump plumbers plums The churchs chimes chimed Telling time 'til eleven Five valve levers Keep the gate closed Go see that guy Fat cows give milk [employer] Gophers Fat frogs flip freely [employer] into bed Get that game to [employer] Thick thistles stick together Cinnamon aluminum linoleum Black bugs blood Lovely lemon linament Red leather, yellow leather  Big grocery buggy    Purple baby carriage [facility] [place] copper coffee pot Ripe purple cabbage Three free throws [employer] tackled  [employer] dipped the dessert  [employer] Buckle that [employer] [place] Shirts shrink, shells shouldnt [place] [employer] Take the tackle box File the flash message Give me five flapjacks Fundamental relatives Dye the pets purple Talking [place] time after time Dark chocolate chunks Political landscape of the kingdom Actuary genius We played yo-yos yesterday

## 2019-12-05 NOTE — Therapy (Signed)
[facility] [address] [place], [place], [address] Phone: [phone]   Fax:  [phone]  Speech Language Pathology Treatment  Patient Details  Name: Samuel Stanley MRN: [id number] Date of Birth: October 29, 1990 Referring Provider (SLP): Francine Graven, MD   Encounter Date: 12/04/2019   End of Session - 12/05/19 0809    Visit Number 2    Number of Visits 13    Date for SLP Re-Evaluation 01/09/20    SLP Start Time 1618    SLP Stop Time  1700    SLP Time Calculation (min) 42 min    Activity Tolerance Patient tolerated treatment well           Past Medical History:  Diagnosis Date  . Hypertension     Past Surgical History:  Procedure Laterality Date  . HERNIA REPAIR    . testicular torsion      There were no vitals filed for this visit.   Subjective Assessment - 12/04/19 1620    Subjective Pt didn't realize he had ST appointment on Tuesday.    Currently in Pain? No/denies                 ADULT SLP TREATMENT - 12/05/19 0803      General Information   Behavior/Cognition Alert;Cooperative      Treatment Provided   Treatment provided Cognitive-Linquistic      Pain Assessment   Pain Assessment No/denies pain      Cognitive-Linquistic Treatment   Treatment focused on Dysarthria;Patient/family/caregiver education    Skilled Treatment Patient reported he has his wife order for him at a drive-thru; SLP asked pt if this is something he would like to do himself; he was non-committal but was receptive to exploring text-to-speech apps on his phone to aid with this. Provided handout with list of apps; pt downloaded one of these and independently typed, "Hello," and played this. Assisted pt with selecting voice more appropriate for him. Demo'd how to add folders and save messages. Provided pt with handout of SLOP strategies for dysarthria and worked with pt to generate some personally relevant phrases (he had  difficulty with this). Also provided tongue twisters for daily practice using strategies. Pt was reluctant (?embarrassed) to demo his phrases or tongue twisters even with SLP demo; SLP encouraged pt to practice at home to become more comfortable; educated that if he wants to improve his speech and learn to use strategies, he will have to practice.       Assessment / Recommendations / Plan   Plan Goals updated      Progression Toward Goals   Progression toward goals Progressing toward goals            SLP Education - 12/05/19 0808    Education Details text-to-speech apps    Person(s) Educated Patient    Methods Explanation;Handout    Comprehension Verbalized understanding;Need further instruction            SLP Short Term Goals - 12/05/19 0815      SLP SHORT TERM GOAL #1   Title Pt will demo 90% functional communication over 8 minutes simple conversation in 2 sessions    Time 4    Period Weeks    Status On-going      SLP SHORT TERM GOAL #2   Title pt will complete dysarthria HEP for speech intelligibility ("tongue twisters") with 75% use of compensations, in 2 sessions    Time 4    Period Weeks  Status On-going      SLP SHORT TERM GOAL #3   Title Pt will use alternative or multimodal means (text-to-speech, writing, etc) to clarify unintelligible speech in 3 sessions with min cues.    Period Weeks    Status New            SLP Long Term Goals - 12/05/19 0816      SLP LONG TERM GOAL #1   Title pt will demo 10 mintues of simple conversation with >90%  intelligibilty (functionally) x3 visits    Time 6   or 13 total visits, for all LTGs   Period Weeks   or 13 total visits, for all LTGs   Status On-going      SLP LONG TERM GOAL #2   Title pt will report ordering at a fast food restaurant x3 with one repeat necessary or using AAC    Time 6    Period Weeks    Status Revised      SLP LONG TERM GOAL #3   Title pt's speech related QOL measure will be <85 indicating  less burden from his speech on QOL than at date of evaluation    Time 6    Period Weeks    Status On-going      SLP LONG TERM GOAL #4   Title Pt will report using alternative or multimodal means to resolve a communication breakdown at work, home, or in the community x 3.    Time 6    Period Weeks    Status New            Plan - 12/05/19 0809    Clinical Impression Statement Pt presents with mod ataxic dysarthria, with functional intelligilibity at 83% in 24 utterances. Pt and wife deny any overt s/sx dysphagia at home with meals or liquids; swallowing to be assessed clinically PRN. Pt appeared shy or reluctant to attempt use of dysarthria strategies with personally relevant phrases or structured tasks today. He was receptive to practicing with a text-to-speech app on his phone which may be a helpful tool for him to clarify unintelligible speech/ resolve communication breakdowns. May wish to consider voice banking or AAC device as pt's dysarthria has been progressive. Question mild cognitive deficits, possibly processing, based on pt's responses at times in conversation today. SLP believes pt would benefit from skilled ST at this time, targeting speech intelligibility strategies and a home exercise program to begin to make compensations for more intelligibile speech habitual for pt.    Speech Therapy Frequency 2x / week    Duration --   6 weeks or 13 total sessions   Treatment/Interventions Environmental controls;Other (comment);SLP instruction and feedback;Compensatory strategies;Patient/family education;Internal/external aids    Potential to Achieve Goals Good    Potential Considerations Severity of impairments           Patient will benefit from skilled therapeutic intervention in order to improve the following deficits and impairments:   Ataxic dysarthria    Problem List Patient Active Problem List   Diagnosis Date Noted  . Anterior dislocation of right shoulder 03/01/2016    Rondel Baton, MS, CCC-SLP Speech-Language Pathologist  Arlana Lindau 12/05/2019, 8:20 AM  [facility] [address] [place], [place], [address] Phone: [phone]   Fax:  [phone]   Name: Samuel Stanley MRN: [id number] Date of Birth: [birth year]

## 2019-12-08 ENCOUNTER — Encounter: Payer: Self-pay | Admitting: Physical Therapy

## 2019-12-08 ENCOUNTER — Other Ambulatory Visit: Payer: Self-pay

## 2019-12-08 ENCOUNTER — Ambulatory Visit: Payer: Commercial Managed Care - PPO | Admitting: Physical Therapy

## 2019-12-08 DIAGNOSIS — M6281 Muscle weakness (generalized): Secondary | ICD-10-CM | POA: Diagnosis not present

## 2019-12-08 DIAGNOSIS — R2689 Other abnormalities of gait and mobility: Secondary | ICD-10-CM

## 2019-12-08 DIAGNOSIS — R2681 Unsteadiness on feet: Secondary | ICD-10-CM

## 2019-12-08 NOTE — Therapy (Signed)
[facility] [address] [place], [place], [address] Phone: [phone]   Fax:  [phone]  Physical Therapy Treatment  Patient Details  Name: Samuel Stanley MRN: [id number] Date of Birth: 1990/08/03 Referring Provider (PT): Francine Graven, DO   Encounter Date: 12/08/2019   PT End of Session - 12/08/19 1942    Visit Number 8    Number of Visits 17    Date for PT Re-Evaluation 02/05/20   POC for 8 weeks, Cert for 90 days   Authorization Type [employer]    PT Start Time 1500    PT Stop Time 1545    PT Time Calculation (min) 45 min    Equipment Utilized During Treatment --   water noodle used for UE support prn   Activity Tolerance Patient tolerated treatment well    Behavior During Therapy [facility] for tasks assessed/performed           Past Medical History:  Diagnosis Date  . Hypertension     Past Surgical History:  Procedure Laterality Date  . HERNIA REPAIR    . testicular torsion      There were no vitals filed for this visit.   Subjective Assessment - 12/08/19 1940    Subjective Pt presents for first aquatic therapy session at [facility] - reports no problems; states he got in water at [facility] this  summer and did ok    Patient is accompained by: Family member   Wife - [name]   Pertinent History Hypertension    Limitations Walking;Standing;House hold activities    Patient Stated Goals Being able to walk and stand    Currently in Pain? No/denies            Aquatic therapy at [facility] - pool temp. 87.6 degrees  Patient seen for aquatic therapy today.  Treatment took place in water 3.5-4 feet deep depending upon activity.  Pt entered and exited the pool via step negotiation using hand rail on Rt side - step by step sequence.  Pt performed gait training in 4' water depth 37m x 4 reps - cues for reciprocal arm swing with leg movement (difficult for pt to coordinate, pt held arms flexed at elbows At his side  for first part of session as he was getting acclimated to the water  Pt performed marching in place with minimal RUE support on side of pool - LE's were initially abducted during first 4-5 reps; pt gradually able to perform without UE support on side of pool  Pt performed stepping strategy - stepping forward, laterally, and back 10 reps each leg with UE support prn  Pt performed side stepping with small squats - with UE movement incorporated- arms out to side with stepping out to side & squat - then UE's adducted with stepping together  - 7m x 1 rep across pool  Pt performed backwards amb. 106m x 1 rep across pool holding onto noodle for UE support (noodle stabilized by PT)  Attempted 1/2 jumping jacks (pt had difficulty jumping - moved each leg out to side somewhat separately and not simultaneously) 10 reps  Lunges - LE's only - forward and switch; pt unable to coordinate and perform cross country skiing exercise with reciprocal arm movement with LE movement  Jogging in place attempted - difficult for pt to do due to inability to maintain balance on balls of feet (in plantarflexed position)  Pt requires buoyancy of the water for support with dynamic balance exercises and  with plyometrics that can not be performed safely on land without fall risk Water current needed for perturbations for challenges with static & dynamic standing balance; viscosity of water needed for strengthening of LE's as well as for trunk musc. Strengthening to fascilitate trunk/core stabilization                            PT Short Term Goals - 12/08/19 1953      PT SHORT TERM GOAL #1   Title Patient will be independent with Initial HEP for balance/strength (ALL STGs: 12/05/2019)    Baseline Patient reports independence with HEP, and completing daily    Time 4    Period Weeks    Status Achieved    Target Date 12/05/19      PT SHORT TERM GOAL #2   Title Patient will undergo further balance  assessment with BERG Balance and LTG to be set as appropriate    Baseline Berg performed on 11/12/19 visit    Time 4    Period Weeks    Status Achieved      PT SHORT TERM GOAL #3   Title Patient will demo ability to complete 5x sit <> stand in </= 20 seconds with UE support to demo improved functional strength/mobility    Baseline 23.84 secs, 18.65 secs w/ UE support    Time 4    Period Weeks    Status Achieved      PT SHORT TERM GOAL #4   Title Patient will demo ability to complete TUG </= 15 seconds to demonstrate reduced fall risk    Baseline 16.75, 13 secs w/o AD    Time 4    Period Weeks    Status Achieved             PT Long Term Goals - 12/08/19 1954      PT LONG TERM GOAL #1   Title Patient will be independent with final HEP for balance/strengthening (ALL LTGs Due: 01/02/20)    Baseline no HEP established    Time 8    Period Weeks    Status New      PT LONG TERM GOAL #2   Title Pt will increase Berg score from 38/56 to >42/56 for improved balance and decreased fall risk.    Baseline 11/12/19 38/56    Time 8    Period Weeks    Status New      PT LONG TERM GOAL #3   Title Patient will improve TUG </= 12 seconds to demonstrate reduced fall risk    Baseline 16.75 secs    Time 8    Period Weeks    Status New      PT LONG TERM GOAL #4   Title Patient will improve 5x sit<> stand to </= 15 seconds with UE to demo improved functional mobility    Baseline 23.84 secs    Time 8    Period Weeks    Status New      PT LONG TERM GOAL #5   Title Patient will improve gait speed to >/= 2.5 ft/sec with LRAD to demo improved community ambulation    Baseline 1.86 ft/sec    Time 8    Period Weeks    Status New      PT LONG TERM GOAL #6   Title Patient will demo ability to ascend/descend 12 stairs, alternating pattern, with supervision and single hand rail to allow for  improved functional mobility    Baseline CGA and bilateral rails    Time 8    Period Weeks    Status  New                 Plan - 12/08/19 1956    Clinical Impression Statement Aquatic therapy session focused on balance and coordination activities and trunk control/core stabilization.  Pt had difficutly maintaining upright posture for Ai Chi postures in 4' water depth with pt leaning forward to maintain balance, demonstrating decreased core stabilization.  Pt also had much difficulty performing contralateral UE movements with LE movements.  Decreased reciprocal arm swing noted with water walking.  Pt tolerated aquatic exercises well and did not require any rest breaks during 45" session.    Personal Factors and Comorbidities Comorbidity 1;Time since onset of injury/illness/exacerbation    Comorbidities HTN    Examination-Activity Limitations Bend;Carry;Caring for Others;Stairs;Stand;Locomotion Level;Squat;Transfers    Examination-Participation Restrictions Occupation;Community Activity;Yard Work    Conservation officer, historic buildings Evolving/Moderate complexity    Rehab Potential Fair    PT Frequency 2x / week    PT Duration 8 weeks    PT Treatment/Interventions ADLs/Self Care Home Management;Aquatic Therapy;Electrical Stimulation;Moist Heat;Contrast Bath;DME Instruction;Gait training;Functional mobility training;Neuromuscular re-education;Stair training;Therapeutic activities;Patient/family education;Therapeutic exercise;Balance training;Orthotic Fit/Training;Manual techniques;Passive range of motion    PT Next Visit Plan How was Pool Visit? Continue to work on gait, balance and coordination activities. Dynamic Seated balance activites (on green air disc)    Consulted and Agree with Plan of Care Patient           Patient will benefit from skilled therapeutic intervention in order to improve the following deficits and impairments:  Abnormal gait, Decreased coordination, Difficulty walking, Decreased safety awareness, Decreased activity tolerance, Impaired sensation, Postural dysfunction,  Decreased strength, Decreased mobility, Decreased balance, Decreased knowledge of use of DME, Decreased endurance  Visit Diagnosis: Other abnormalities of gait and mobility  Unsteadiness on feet     Problem List Patient Active Problem List   Diagnosis Date Noted  . Anterior dislocation of right shoulder 03/01/2016    Roby Spalla, Donavan Burnet, PT, ATRIC 12/08/2019, 8:07 PM  [facility] [address] [place], [place], [address] Phone: [phone]   Fax:  [phone]  Name: Toney Difatta MRN: [id number] Date of Birth: 22-Aug-1990

## 2019-12-09 ENCOUNTER — Ambulatory Visit: Payer: Commercial Managed Care - PPO

## 2019-12-11 ENCOUNTER — Other Ambulatory Visit: Payer: Self-pay

## 2019-12-11 ENCOUNTER — Ambulatory Visit: Payer: Commercial Managed Care - PPO | Admitting: Speech Pathology

## 2019-12-11 ENCOUNTER — Ambulatory Visit: Payer: Commercial Managed Care - PPO

## 2019-12-11 DIAGNOSIS — M6281 Muscle weakness (generalized): Secondary | ICD-10-CM | POA: Diagnosis not present

## 2019-12-11 DIAGNOSIS — R471 Dysarthria and anarthria: Secondary | ICD-10-CM

## 2019-12-11 DIAGNOSIS — R2689 Other abnormalities of gait and mobility: Secondary | ICD-10-CM

## 2019-12-11 DIAGNOSIS — R2681 Unsteadiness on feet: Secondary | ICD-10-CM

## 2019-12-11 NOTE — Therapy (Signed)
[facility] [address] [place], [place], [address] Phone: [phone]   Fax:  [phone]  Speech Language Pathology Treatment  Patient Details  Name: Samuel Stanley MRN: [id number] Date of Birth: [birth year] Referring Provider (SLP): Francine Graven, MD   Encounter Date: 12/11/2019   End of Session - 12/11/19 1842    Visit Number 3    Number of Visits 13    Date for SLP Re-Evaluation 01/09/20    SLP Start Time 1703    SLP Stop Time  1744    SLP Time Calculation (min) 41 min    Activity Tolerance Patient tolerated treatment well           Past Medical History:  Diagnosis Date  . Hypertension     Past Surgical History:  Procedure Laterality Date  . HERNIA REPAIR    . testicular torsion      There were no vitals filed for this visit.   Subjective Assessment - 12/11/19 1703    Subjective "I didn't do the tongue twisters."    Currently in Pain? No/denies                 ADULT SLP TREATMENT - 12/11/19 1837      General Information   Behavior/Cognition Alert;Cooperative      Treatment Provided   Treatment provided Cognitive-Linquistic      Pain Assessment   Pain Assessment No/denies pain      Cognitive-Linquistic Treatment   Treatment focused on Dysarthria;Patient/family/caregiver education    Skilled Treatment Patient using notably slower rate in conversation with SLP today; he was >95% intelligible. Reports he has been using text-to-speech on the phone with his grandparents, has also used it with his wife and a Radio broadcast assistant. Pt stated, "I know I have to go slow." He reports slowing down for a coworker this week, "He couldn't understand me before but now he can." Pt used slow rate in simple-mod complex conversation 8 minutes re: his 70 month old, with modified independence. Demo'd HEP with occasional mod cues for segmenting syllables of longer words. We discussed voice and message banking and pt was  interested in this. We reviewed various options together and pt most interested in the Voice Keeper app. SLP shared website and pt to research this at home.       Assessment / Recommendations / Plan   Plan Continue with current plan of care      Progression Toward Goals   Progression toward goals Progressing toward goals            SLP Education - 12/11/19 1842    Education Details voice and message banking    Person(s) Educated Patient    Methods Explanation    Comprehension Verbalized understanding            SLP Short Term Goals - 12/11/19 1846      SLP SHORT TERM GOAL #1   Title Pt will demo 90% functional communication over 8 minutes simple conversation in 2 sessions    Baseline 12/11/19    Time 3    Period Weeks    Status On-going      SLP SHORT TERM GOAL #2   Title pt will complete dysarthria HEP for speech intelligibility ("tongue twisters") with 75% use of compensations, in 2 sessions    Baseline 12/11/19    Time 3    Period Weeks    Status On-going      SLP SHORT TERM GOAL #3  Title Pt will use alternative or multimodal means (text-to-speech, writing, etc) to clarify unintelligible speech in 3 sessions with min cues.    Time 3    Period Weeks    Status On-going            SLP Long Term Goals - 12/11/19 1846      SLP LONG TERM GOAL #1   Title pt will demo 10 mintues of simple conversation with >90%  intelligibilty (functionally) x3 visits    Time 5   or 13 total visits, for all LTGs   Period Weeks   or 13 total visits, for all LTGs   Status On-going      SLP LONG TERM GOAL #2   Title pt will report ordering at a fast food restaurant x3 with one repeat necessary or using AAC    Time 5    Period Weeks    Status Revised      SLP LONG TERM GOAL #3   Title pt's speech related QOL measure will be <85 indicating less burden from his speech on QOL than at date of evaluation    Time 5    Period Weeks    Status On-going      SLP LONG TERM GOAL #4    Title Pt will report using alternative or multimodal means to resolve a communication breakdown at work, home, or in the community x 3.    Time 5    Period Weeks    Status New            Plan - 12/11/19 1842    Clinical Impression Statement Pt presents with mod ataxic dysarthria; intelligibility greatly improved in session today, 95% for this trained listener with pt using slower rate. Pt and wife deny any overt s/sx dysphagia at home with meals or liquids; swallowing to be assessed clinically PRN. He is receptive to practicing with a text-to-speech app on his phone which he reports has been helfpul to him to clarify unintelligible speech/ resolve communication breakdowns. May wish to consider voice banking or AAC device as pt's dysarthria has been progressive. Pt's processing in conversation, response time seems WNL today. SLP believes pt would benefit from skilled ST at this time, targeting speech intelligibility strategies and a home exercise program to begin to make compensations for more intelligibile speech habitual for pt.    Speech Therapy Frequency 2x / week    Duration --   6 weeks or 13 total sessions   Treatment/Interventions Environmental controls;Other (comment);SLP instruction and feedback;Compensatory strategies;Patient/family education;Internal/external aids    Potential to Achieve Goals Good    Potential Considerations Severity of impairments           Patient will benefit from skilled therapeutic intervention in order to improve the following deficits and impairments:   Ataxic dysarthria    Problem List Patient Active Problem List   Diagnosis Date Noted  . Anterior dislocation of right shoulder 03/01/2016   Rondel Baton, MS, CCC-SLP Speech-Language Pathologist   Arlana Lindau 12/11/2019, 6:47 PM  [facility] [address] [place], [place], [address] Phone: [phone]   Fax:   [phone]   Name: Samuel Stanley MRN: [id number] Date of Birth: 10/24/1990

## 2019-12-11 NOTE — Patient Instructions (Signed)
[email or web address]  This will let you use your iphone to store the sound of your own voice. You can also use this voice with text to speech.

## 2019-12-11 NOTE — Therapy (Signed)
[facility] [address] [place], [place], [address] Phone: [phone]   Fax:  [phone]  Physical Therapy Treatment  Patient Details  Name: Samuel Stanley MRN: [id number] Date of Birth: [birth year] Referring Provider (PT): Francine Graven, DO   Encounter Date: 12/11/2019   PT End of Session - 12/11/19 1627    Visit Number 9    Number of Visits 17    Date for PT Re-Evaluation 02/05/20   POC for 8 weeks, Cert for 90 days   Authorization Type [employer]    PT Start Time 1625   PT running behind   PT Stop Time 1706    PT Time Calculation (min) 41 min    Equipment Utilized During Treatment Gait belt    Activity Tolerance Patient tolerated treatment well    Behavior During Therapy [facility] for tasks assessed/performed           Past Medical History:  Diagnosis Date  . Hypertension     Past Surgical History:  Procedure Laterality Date  . HERNIA REPAIR    . testicular torsion      There were no vitals filed for this visit.   Subjective Assessment - 12/11/19 1627    Subjective Pt reports the pool went well.    Patient is accompained by: Family member   Wife - [name]   Pertinent History Hypertension    Limitations Walking;Standing;House hold activities    Patient Stated Goals Being able to walk and stand    Currently in Pain? No/denies                             [facility] Adult PT Treatment/Exercise - 12/11/19 1628      Ambulation/Gait   Ambulation/Gait Yes    Ambulation/Gait Assistance 5: Supervision    Ambulation/Gait Assistance Details Pt was given verbal cues ot slow down and try to increase step length.    Ambulation Distance (Feet) 230 Feet    Assistive device None    Gait Pattern Step-through pattern;Decreased arm swing - right;Decreased arm swing - left;Decreased step length - right;Decreased step length - left;Ataxic;Wide base of support    Ambulation Surface Level;Indoor       Neuro Re-ed    Neuro Re-ed Details  Gait with holding walking sticks with PT trying to facilitate more arm swing x 230'. Pt was cued to relax arms and try to increase step length. Pt taking too quick of steps to get proper arm swing. Put down sticks and then focused on just walking with relaxing arms and had some noted improvement in arm swing and step length. Floor ladder with reciprocal steps near counter x 6 laps with occasional UE support CGA with verbal cues to take his time, then performed side stepping with occasional UE support x 4 bouts with verbal cues to increase step length. Pt more challenged with going to the left. Standing on airex without UE support x 30 sec then with holding 3# ball and raising overhead x 10 CGA and then holding out in front with trunk rotation x 10 CGA. Pt was cued to brace tummy for more core stability.  Standing on airex and tossing ball with PT student x 1 min CGA. Sit to stands from mat with holding 3# med ball out front with cues to lean forward and not brace legs on mat and to also lean forward and control descent 5 x 2.  PT Education - 12/11/19 1906    Education Details Pt to continue to focus on increasing step length and slowing down.    Person(s) Educated Patient    Methods Explanation    Comprehension Verbalized understanding            PT Short Term Goals - 12/08/19 1953      PT SHORT TERM GOAL #1   Title Patient will be independent with Initial HEP for balance/strength (ALL STGs: 12/05/2019)    Baseline Patient reports independence with HEP, and completing daily    Time 4    Period Weeks    Status Achieved    Target Date 12/05/19      PT SHORT TERM GOAL #2   Title Patient will undergo further balance assessment with BERG Balance and LTG to be set as appropriate    Baseline Berg performed on 11/12/19 visit    Time 4    Period Weeks    Status Achieved      PT SHORT TERM GOAL #3   Title Patient will demo ability to  complete 5x sit <> stand in </= 20 seconds with UE support to demo improved functional strength/mobility    Baseline 23.84 secs, 18.65 secs w/ UE support    Time 4    Period Weeks    Status Achieved      PT SHORT TERM GOAL #4   Title Patient will demo ability to complete TUG </= 15 seconds to demonstrate reduced fall risk    Baseline 16.75, 13 secs w/o AD    Time 4    Period Weeks    Status Achieved             PT Long Term Goals - 12/08/19 1954      PT LONG TERM GOAL #1   Title Patient will be independent with final HEP for balance/strengthening (ALL LTGs Due: 01/02/20)    Baseline no HEP established    Time 8    Period Weeks    Status New      PT LONG TERM GOAL #2   Title Pt will increase Berg score from 38/56 to >42/56 for improved balance and decreased fall risk.    Baseline 11/12/19 38/56    Time 8    Period Weeks    Status New      PT LONG TERM GOAL #3   Title Patient will improve TUG </= 12 seconds to demonstrate reduced fall risk    Baseline 16.75 secs    Time 8    Period Weeks    Status New      PT LONG TERM GOAL #4   Title Patient will improve 5x sit<> stand to </= 15 seconds with UE to demo improved functional mobility    Baseline 23.84 secs    Time 8    Period Weeks    Status New      PT LONG TERM GOAL #5   Title Patient will improve gait speed to >/= 2.5 ft/sec with LRAD to demo improved community ambulation    Baseline 1.86 ft/sec    Time 8    Period Weeks    Status New      PT LONG TERM GOAL #6   Title Patient will demo ability to ascend/descend 12 stairs, alternating pattern, with supervision and single hand rail to allow for improved functional mobility    Baseline CGA and bilateral rails    Time 8    Period Weeks  Status New                 Plan - 12/11/19 1907    Clinical Impression Statement Pt was able to demonstrate some improvement in step length and arm swing at end of session after practice. Challenged with reciprocal  stepping activities with getting proper foot placement at times. PT continued to add in more core activities with balance to help with stability.    Personal Factors and Comorbidities Comorbidity 1;Time since onset of injury/illness/exacerbation    Comorbidities HTN    Examination-Activity Limitations Bend;Carry;Caring for Others;Stairs;Stand;Locomotion Level;Squat;Transfers    Examination-Participation Restrictions Occupation;Community Activity;Yard Work    Conservation officer, historic buildings Evolving/Moderate complexity    Rehab Potential Fair    PT Frequency 2x / week    PT Duration 8 weeks    PT Treatment/Interventions ADLs/Self Care Home Management;Aquatic Therapy;Electrical Stimulation;Moist Heat;Contrast Bath;DME Instruction;Gait training;Functional mobility training;Neuromuscular re-education;Stair training;Therapeutic activities;Patient/family education;Therapeutic exercise;Balance training;Orthotic Fit/Training;Manual techniques;Passive range of motion    PT Next Visit Plan Continue to work on gait, balance and coordination activities. Dynamic Seated balance activites (on green air disc). Add in more core activities with balance.    Consulted and Agree with Plan of Care Patient           Patient will benefit from skilled therapeutic intervention in order to improve the following deficits and impairments:  Abnormal gait, Decreased coordination, Difficulty walking, Decreased safety awareness, Decreased activity tolerance, Impaired sensation, Postural dysfunction, Decreased strength, Decreased mobility, Decreased balance, Decreased knowledge of use of DME, Decreased endurance  Visit Diagnosis: Other abnormalities of gait and mobility  Muscle weakness (generalized)  Unsteadiness on feet     Problem List Patient Active Problem List   Diagnosis Date Noted  . Anterior dislocation of right shoulder 03/01/2016    Ronn Melena, PT, DPT, NCS 12/11/2019, 7:10 PM  [facility] [address] [place], [place], [address] Phone: [phone]   Fax:  [phone]  Name: Essam Lowdermilk MRN: [id number] Date of Birth: 1990-07-11

## 2019-12-16 ENCOUNTER — Ambulatory Visit: Payer: Commercial Managed Care - PPO | Admitting: Speech Pathology

## 2019-12-16 ENCOUNTER — Other Ambulatory Visit: Payer: Self-pay

## 2019-12-16 ENCOUNTER — Ambulatory Visit: Payer: Commercial Managed Care - PPO | Attending: Neurology

## 2019-12-16 DIAGNOSIS — R2681 Unsteadiness on feet: Secondary | ICD-10-CM | POA: Insufficient documentation

## 2019-12-16 DIAGNOSIS — R270 Ataxia, unspecified: Secondary | ICD-10-CM | POA: Insufficient documentation

## 2019-12-16 DIAGNOSIS — R41844 Frontal lobe and executive function deficit: Secondary | ICD-10-CM | POA: Insufficient documentation

## 2019-12-16 DIAGNOSIS — R260 Ataxic gait: Secondary | ICD-10-CM | POA: Insufficient documentation

## 2019-12-16 DIAGNOSIS — R471 Dysarthria and anarthria: Secondary | ICD-10-CM | POA: Diagnosis present

## 2019-12-16 DIAGNOSIS — R41842 Visuospatial deficit: Secondary | ICD-10-CM | POA: Diagnosis present

## 2019-12-16 DIAGNOSIS — R2689 Other abnormalities of gait and mobility: Secondary | ICD-10-CM | POA: Diagnosis present

## 2019-12-16 DIAGNOSIS — R41840 Attention and concentration deficit: Secondary | ICD-10-CM | POA: Insufficient documentation

## 2019-12-16 DIAGNOSIS — M6281 Muscle weakness (generalized): Secondary | ICD-10-CM | POA: Diagnosis present

## 2019-12-16 DIAGNOSIS — R278 Other lack of coordination: Secondary | ICD-10-CM | POA: Diagnosis present

## 2019-12-16 NOTE — Therapy (Signed)
[facility] [address] [place], [place], [address] Phone: [phone]   Fax:  [phone]  Physical Therapy Treatment  Patient Details  Name: Samuel Stanley MRN: [id number] Date of Birth: 13-Sep-1990 Referring Provider (PT): Francine Graven, DO   Encounter Date: 12/16/2019   PT End of Session - 12/16/19 1919    Visit Number 10    Number of Visits 17    Date for PT Re-Evaluation 02/05/20   POC for 8 weeks, Cert for 90 days   Authorization Type [employer]    PT Start Time 1700    PT Stop Time 1742    PT Time Calculation (min) 42 min    Equipment Utilized During Treatment Gait belt    Activity Tolerance Patient tolerated treatment well    Behavior During Therapy [facility] for tasks assessed/performed           Past Medical History:  Diagnosis Date  . Hypertension     Past Surgical History:  Procedure Laterality Date  . HERNIA REPAIR    . testicular torsion      There were no vitals filed for this visit.   Subjective Assessment - 12/16/19 1702    Subjective Patient reports doing well, no changes since last visit. No falls to report. No pain.    Patient is accompained by: Family member   Wife - [name]   Pertinent History Hypertension    Limitations Walking;Standing;House hold activities    Patient Stated Goals Being able to walk and stand    Currently in Pain? No/denies                             [facility] Adult PT Treatment/Exercise - 12/16/19 0001      Transfers   Transfers Sit to Stand;Stand to Sit    Sit to Stand 6: Modified independent (Device/Increase time)    Stand to Sit 6: Modified independent (Device/Increase time)    Comments completed sit <> stands with BLE's placed on blue mat to further challenge balance, increased challenge noted with completion requiring verbal cues for technique. Intermittent CGA required. Decreased control with descent.       Ambulation/Gait    Ambulation/Gait Yes    Ambulation/Gait Assistance 5: Supervision;4: Min guard    Ambulation/Gait Assistance Details Completed ambulation without AD, PT providing verbal cues to slow gait and work on improved arm swing. Patient demo stiff posture with ambulation. PT holding patient's hands with ambulation and promoting improved arm swing iwth reciprocal stepping x 115. After completion patient demo improved carryover and arm swing but unable to maintain > 50 ft.     Ambulation Distance (Feet) 345 Feet    Assistive device None    Gait Pattern Step-through pattern;Decreased arm swing - right;Decreased arm swing - left;Decreased step length - right;Decreased step length - left;Ataxic;Wide base of support    Ambulation Surface Level;Indoor      Neuro Re-ed    Neuro Re-ed Details  seated on green air disc on mat, completed the following exercises with intermittent UE support: alternating marching x 10 reps, increased difficulty noted with lifting RLE and ability to maintain seated balance. PT providing tactile/verbal cues for improved posture and abdominal activation with completion. Followed by completion of alternating LAQ, x 10 reps bilaterally. continued verbal cues for posture and using core for stability. improved completion of LAQ noted. Standing on airex pad with feet shoudler width apart, completed lift/chops in  diagonal direction x 10 reps each way to promote improved balance and core control. PT providing CGA and verbal cues for technique. Standing on rockerboard board completed bean bag toss to basket x 10 bags with board ant/post, and x 10 bags with board lateral. Increased balance challenge with board positioned lateral. Pt providing CGA throughout with completion, Provided verbal cues to avoid using UE support from // bars to maintain balance after throw to try to correct himself. Demo improved completion with verbal cues.                Balance Exercises - 12/16/19 0001      Balance  Exercises: Standing   Stepping Strategy Anterior;Foam/compliant surface;Limitations    Stepping Strategy Limitations standing on red balance beam, completed anterior stepping strategy on/off beam x 15 reps alternating BLE. Patient require single UE support for completion, with attempts without UE support patient demo increased imbalance requiring CGA from PT.     Sidestepping Foam/compliant support;Upper extremity support;3 reps;Limitations    Sidestepping Limitations completed side stepping down and back blue balance beam x 2 reps with intermittent UE support, verbal cues for step length needed and slowed pacecrequired. Patient wanted to have wide BOS throughout for improved stability.               PT Short Term Goals - 12/08/19 1953      PT SHORT TERM GOAL #1   Title Patient will be independent with Initial HEP for balance/strength (ALL STGs: 12/05/2019)    Baseline Patient reports independence with HEP, and completing daily    Time 4    Period Weeks    Status Achieved    Target Date 12/05/19      PT SHORT TERM GOAL #2   Title Patient will undergo further balance assessment with BERG Balance and LTG to be set as appropriate    Baseline Berg performed on 11/12/19 visit    Time 4    Period Weeks    Status Achieved      PT SHORT TERM GOAL #3   Title Patient will demo ability to complete 5x sit <> stand in </= 20 seconds with UE support to demo improved functional strength/mobility    Baseline 23.84 secs, 18.65 secs w/ UE support    Time 4    Period Weeks    Status Achieved      PT SHORT TERM GOAL #4   Title Patient will demo ability to complete TUG </= 15 seconds to demonstrate reduced fall risk    Baseline 16.75, 13 secs w/o AD    Time 4    Period Weeks    Status Achieved             PT Long Term Goals - 12/08/19 1954      PT LONG TERM GOAL #1   Title Patient will be independent with final HEP for balance/strengthening (ALL LTGs Due: 01/02/20)    Baseline no HEP  established    Time 8    Period Weeks    Status New      PT LONG TERM GOAL #2   Title Pt will increase Berg score from 38/56 to >42/56 for improved balance and decreased fall risk.    Baseline 11/12/19 38/56    Time 8    Period Weeks    Status New      PT LONG TERM GOAL #3   Title Patient will improve TUG </= 12 seconds to demonstrate reduced fall risk  Baseline 16.75 secs    Time 8    Period Weeks    Status New      PT LONG TERM GOAL #4   Title Patient will improve 5x sit<> stand to </= 15 seconds with UE to demo improved functional mobility    Baseline 23.84 secs    Time 8    Period Weeks    Status New      PT LONG TERM GOAL #5   Title Patient will improve gait speed to >/= 2.5 ft/sec with LRAD to demo improved community ambulation    Baseline 1.86 ft/sec    Time 8    Period Weeks    Status New      PT LONG TERM GOAL #6   Title Patient will demo ability to ascend/descend 12 stairs, alternating pattern, with supervision and single hand rail to allow for improved functional mobility    Baseline CGA and bilateral rails    Time 8    Period Weeks    Status New                 Plan - 12/16/19 1920    Clinical Impression Statement Continued gait training today with PT providing HHA to promote improved arm swing with reciprocal stepping, improved carryover noted immediately but demo decreased carryover by end of session. Continued balance activites to promote improved core stability and coordination as tolerated by patient. Will continue to progress toward all goals.    Personal Factors and Comorbidities Comorbidity 1;Time since onset of injury/illness/exacerbation    Comorbidities HTN    Examination-Activity Limitations Bend;Carry;Caring for Others;Stairs;Stand;Locomotion Level;Squat;Transfers    Examination-Participation Restrictions Occupation;Community Activity;Yard Work    Conservation officer, historic buildings Evolving/Moderate complexity    Rehab Potential Fair      PT Frequency 2x / week    PT Duration 8 weeks    PT Treatment/Interventions ADLs/Self Care Home Management;Aquatic Therapy;Electrical Stimulation;Moist Heat;Contrast Bath;DME Instruction;Gait training;Functional mobility training;Neuromuscular re-education;Stair training;Therapeutic activities;Patient/family education;Therapeutic exercise;Balance training;Orthotic Fit/Training;Manual techniques;Passive range of motion    PT Next Visit Plan Continue to work on gait, balance and coordination activities. Dynamic Seated balance activites (on green air disc). Add in more core activities with balance.    Consulted and Agree with Plan of Care Patient           Patient will benefit from skilled therapeutic intervention in order to improve the following deficits and impairments:  Abnormal gait, Decreased coordination, Difficulty walking, Decreased safety awareness, Decreased activity tolerance, Impaired sensation, Postural dysfunction, Decreased strength, Decreased mobility, Decreased balance, Decreased knowledge of use of DME, Decreased endurance  Visit Diagnosis: Ataxic gait  Other abnormalities of gait and mobility  Muscle weakness (generalized)  Unsteadiness on feet     Problem List Patient Active Problem List   Diagnosis Date Noted  . Anterior dislocation of right shoulder 03/01/2016    Tempie Donning, PT, DPT 12/16/2019, 7:22 PM  [facility] [address] [place], [place], [address] Phone: [phone]   Fax:  [phone]  Name: Samuel Stanley MRN: [id number] Date of Birth: 02/19/1991

## 2019-12-16 NOTE — Therapy (Signed)
[facility] [address] [place], [place], [address] Phone: [phone]   Fax:  [phone]  Speech Language Pathology Treatment  Patient Details  Name: Samuel Stanley MRN: [id number] Date of Birth: 09/28/1990 Referring Provider (SLP): Francine Graven, MD   Encounter Date: 12/16/2019   End of Session - 12/16/19 1709    Visit Number 4    Number of Visits 13    Date for SLP Re-Evaluation 01/09/20    SLP Start Time 1617    SLP Stop Time  1656    SLP Time Calculation (min) 39 min    Activity Tolerance Patient tolerated treatment well           Past Medical History:  Diagnosis Date  . Hypertension     Past Surgical History:  Procedure Laterality Date  . HERNIA REPAIR    . testicular torsion      There were no vitals filed for this visit.   Subjective Assessment - 12/16/19 1704    Subjective Brief, one word responses to initial questions    Patient is accompained by: --   new clinician observer present   Currently in Pain? No/denies                 ADULT SLP TREATMENT - 12/16/19 1705      General Information   Behavior/Cognition Alert;Cooperative;Other (comment)   flat affect     Treatment Provided   Treatment provided Cognitive-Linquistic      Pain Assessment   Pain Assessment No/denies pain      Cognitive-Linquistic Treatment   Treatment focused on Dysarthria;Patient/family/caregiver education    Skilled Treatment Patient reports using text-to-speech on phone at work to resolve communication breakdowns. Pt appeared more reserved this session, less conversational possibly due to presence of observing clinician. Completed HEP (tongue twisters only, pt's choice) with occasional min-mod cues for pauses. Generated sentences +cognitive load with extra time for processing required, occasional mod cues for dysarthria compensations. Progressed to mod complex question and answer, and 5 minutes simple  conversation with min-mod A, intelligibility ~95% for this trained listener today.      Assessment / Recommendations / Plan   Plan Continue with current plan of care      Progression Toward Goals   Progression toward goals Progressing toward goals              SLP Short Term Goals - 12/16/19 1709      SLP SHORT TERM GOAL #1   Title Pt will demo 90% functional communication over 8 minutes simple conversation in 2 sessions    Baseline 12/11/19    Time 2    Period Weeks    Status On-going      SLP SHORT TERM GOAL #2   Title pt will complete dysarthria HEP for speech intelligibility ("tongue twisters") with 75% use of compensations, in 2 sessions    Baseline 12/11/19    Time 2    Period Weeks    Status On-going      SLP SHORT TERM GOAL #3   Title Pt will use alternative or multimodal means (text-to-speech, writing, etc) to clarify unintelligible speech in 3 sessions with min cues.    Time 2    Period Weeks    Status On-going            SLP Long Term Goals - 12/16/19 1710      SLP LONG TERM GOAL #1   Title pt will demo 10 mintues  of simple conversation with >90%  intelligibilty (functionally) x3 visits    Time 4   or 13 total visits, for all LTGs   Period Weeks   or 13 total visits, for all LTGs   Status On-going      SLP LONG TERM GOAL #2   Title pt will report ordering at a fast food restaurant x3 with one repeat necessary or using AAC    Time 4    Period Weeks    Status Revised      SLP LONG TERM GOAL #3   Title pt's speech related QOL measure will be <85 indicating less burden from his speech on QOL than at date of evaluation    Time 4    Period Weeks    Status On-going      SLP LONG TERM GOAL #4   Title Pt will report using alternative or multimodal means to resolve a communication breakdown at work, home, or in the community x 3.    Baseline 12/16/19    Time 4    Period Weeks    Status On-going            Plan - 12/16/19 1709    Clinical  Impression Statement Pt presents with mod ataxic dysarthria; intelligibility greatly improved in session today, 95% for this trained listener with pt using slower rate. Pt and wife deny any overt s/sx dysphagia at home with meals or liquids; swallowing to be assessed clinically PRN. He is receptive to practicing with a text-to-speech app on his phone which he reports has been helfpul to him to clarify unintelligible speech/ resolve communication breakdowns. May wish to consider voice banking or AAC device as pt's dysarthria has been progressive. Pt's processing in conversation, response time seems WNL today. SLP believes pt would benefit from skilled ST at this time, targeting speech intelligibility strategies and a home exercise program to begin to make compensations for more intelligibile speech habitual for pt.    Speech Therapy Frequency 2x / week    Duration --   6 weeks or 13 total sessions   Treatment/Interventions Environmental controls;Other (comment);SLP instruction and feedback;Compensatory strategies;Patient/family education;Internal/external aids    Potential to Achieve Goals Good    Potential Considerations Severity of impairments           Patient will benefit from skilled therapeutic intervention in order to improve the following deficits and impairments:   Ataxic dysarthria    Problem List Patient Active Problem List   Diagnosis Date Noted  . Anterior dislocation of right shoulder 03/01/2016   Rondel Baton, MS, CCC-SLP Speech-Language Pathologist  Arlana Lindau 12/16/2019, 5:11 PM  [place] [facility] [address] [place], [place], [address] Phone: [phone]   Fax:  [phone]   Name: Samuel Stanley MRN: [id number] Date of Birth: [birth year]

## 2019-12-18 ENCOUNTER — Other Ambulatory Visit: Payer: Self-pay

## 2019-12-18 ENCOUNTER — Ambulatory Visit: Payer: Commercial Managed Care - PPO

## 2019-12-18 ENCOUNTER — Ambulatory Visit: Payer: Commercial Managed Care - PPO | Admitting: Speech Pathology

## 2019-12-18 DIAGNOSIS — R260 Ataxic gait: Secondary | ICD-10-CM | POA: Diagnosis not present

## 2019-12-18 DIAGNOSIS — R2689 Other abnormalities of gait and mobility: Secondary | ICD-10-CM

## 2019-12-18 DIAGNOSIS — M6281 Muscle weakness (generalized): Secondary | ICD-10-CM

## 2019-12-18 DIAGNOSIS — R2681 Unsteadiness on feet: Secondary | ICD-10-CM

## 2019-12-18 DIAGNOSIS — R471 Dysarthria and anarthria: Secondary | ICD-10-CM

## 2019-12-18 NOTE — Therapy (Signed)
[facility] [address] [place], [place], [address] Phone: [phone]   Fax:  [phone]  Speech Language Pathology Treatment  Patient Details  Name: Samuel Stanley MRN: [id number] Date of Birth: 10/17/1990 Referring Provider (SLP): Leitha Schuller, MD   Encounter Date: 12/18/2019   End of Session - 12/18/19 2051    Visit Number 6    Number of Visits 13    Date for SLP Re-Evaluation 01/09/20    SLP Start Time 1618    SLP Stop Time  1700    SLP Time Calculation (min) 42 min    Activity Tolerance Patient tolerated treatment well           Past Medical History:  Diagnosis Date  . Hypertension     Past Surgical History:  Procedure Laterality Date  . HERNIA REPAIR    . testicular torsion      There were no vitals filed for this visit.   Subjective Assessment - 12/18/19 1624    Subjective Pt had to communicate in an emergency at work, reports it went fine.    Currently in Pain? No/denies                 ADULT SLP TREATMENT - 12/18/19 1627      General Information   Behavior/Cognition Alert;Cooperative;Other (comment)      Treatment Provided   Treatment provided Cognitive-Linquistic      Pain Assessment   Pain Assessment No/denies pain      Cognitive-Linquistic Treatment   Treatment focused on Dysarthria;Patient/family/caregiver education    Skilled Treatment Patient completed HEP for dysarthria independently. Used writing, slower speech to make requests at work: "He understands me now." He appeared more comfortable in session than previous one, very talkative today. He used compensations throughout session for >95% intelligibility in 10 minutes simple-mod complex conversation. He has only 2 more scheduled appointments; anticipate he will meet goals within this time frame; recommended pt discuss with wife whether or not her wishes to pursue voice banking or additional goals or d/c ST when current  goals met.       Assessment / Recommendations / Plan   Plan Continue with current plan of care      Progression Toward Goals   Progression toward goals Progressing toward goals            SLP Education - 12/18/19 2051    Education Details consider whether he would like to work on voice/message Midwife) Educated Patient    Methods Explanation    Comprehension Verbalized understanding            SLP Short Term Goals - 12/18/19 1658      SLP SHORT TERM GOAL #1   Title Pt will demo 90% functional communication over 8 minutes simple conversation in 2 sessions    Baseline 12/11/19 10/721    Time 2    Period Weeks    Status Achieved      SLP SHORT TERM GOAL #2   Title pt will complete dysarthria HEP for speech intelligibility ("tongue twisters") with 75% use of compensations, in 2 sessions    Baseline 12/11/19 12/18/19    Time 2    Period Weeks    Status Achieved      SLP SHORT TERM GOAL #3   Title Pt will use alternative or multimodal means (text-to-speech, writing, etc) to clarify unintelligible speech in 3 sessions with min cues.    Baseline 12/18/19  Time 2    Period Weeks    Status On-going            SLP Long Term Goals - 12/18/19 1659      SLP LONG TERM GOAL #1   Title pt will demo 10 mintues of simple conversation with >90%  intelligibilty (functionally) x3 visits    Baseline 12/18/19    Time 4   or 13 total visits, for all LTGs   Period Weeks   or 13 total visits, for all LTGs   Status On-going      SLP LONG TERM GOAL #2   Title pt will report ordering at a fast food restaurant x3 with one repeat necessary or using AAC    Time 4    Period Weeks    Status Deferred   Using the app to order ahead     SLP LONG TERM GOAL #3   Title pt's speech related QOL measure will be <85 indicating less burden from his speech on QOL than at date of evaluation    Time 4    Period Weeks    Status On-going      SLP LONG TERM GOAL #4   Title Pt will report  using alternative or multimodal means to resolve a communication breakdown at work, home, or in the community x 3.    Baseline 12/16/19 10/7/ 21    Time 4    Period Weeks    Status On-going            Plan - 12/18/19 2052    Clinical Impression Statement Pt presents with mod ataxic dysarthria; intelligibility greatly improved in session today, 95% for this trained listener with pt using slower rate. Pt and wife deny any overt s/sx dysphagia at home with meals or liquids; swallowing to be assessed clinically PRN. He is receptive to practicing with a text-to-speech app on his phone which he reports has been helfpul to him to clarify unintelligible speech/ resolve communication breakdowns. Making excellent progress toward goals and anticipate goals met in next 2-4 sessions; May wish to consider voice banking or AAC device as pt's dysarthria has been progressive; pt to discuss this with wife and we will add goals if he wishes to pursue this. Pt's processing in conversation, response time seems WNL today. SLP believes pt would benefit from skilled ST at this time, targeting speech intelligibility strategies and a home exercise program to begin to make compensations for more intelligibile speech habitual for pt.    Speech Therapy Frequency 2x / week    Duration --   6 weeks or 13 total sessions   Treatment/Interventions Environmental controls;Other (comment);SLP instruction and feedback;Compensatory strategies;Patient/family education;Internal/external aids    Potential to Achieve Goals Good    Potential Considerations Severity of impairments           Patient will benefit from skilled therapeutic intervention in order to improve the following deficits and impairments:   Ataxic dysarthria    Problem List Patient Active Problem List   Diagnosis Date Noted  . Anterior dislocation of right shoulder 03/01/2016   Deneise Lever, [place], [place] 12/18/2019, 8:55 PM  [facility] [address] [place], [place], [address] Phone: [phone]   Fax:  [phone]   Name: Samuel Stanley MRN: [id number] Date of Birth: [birth year]

## 2019-12-18 NOTE — Therapy (Signed)
[facility] [address] [place], [place], [address] Phone: [phone]   Fax:  [phone]  Physical Therapy Treatment  Patient Details  Name: Samuel Stanley MRN: [id number] Date of Birth: 01/25/1991 Referring Provider (PT): Francine Graven, DO   Encounter Date: 12/18/2019   PT End of Session - 12/18/19 1707    Visit Number 11    Number of Visits 17    Date for PT Re-Evaluation 02/05/20   POC for 8 weeks, Cert for 90 days   Authorization Type [employer]    PT Start Time 1702    PT Stop Time 1743    PT Time Calculation (min) 41 min    Equipment Utilized During Treatment Gait belt    Activity Tolerance Patient tolerated treatment well    Behavior During Therapy WFL for tasks assessed/performed           Past Medical History:  Diagnosis Date  . Hypertension     Past Surgical History:  Procedure Laterality Date  . HERNIA REPAIR    . testicular torsion      There were no vitals filed for this visit.   Subjective Assessment - 12/18/19 1705    Subjective Patient reports no new changes/complaints since last visit. No falls to report.    Patient is accompained by: Family member   Wife - [name]   Pertinent History Hypertension    Limitations Walking;Standing;House hold activities    Patient Stated Goals Being able to walk and stand    Currently in Pain? No/denies                             [facility] Adult PT Treatment/Exercise - 12/18/19 0001      Transfers   Transfers Sit to Stand;Stand to Sit    Sit to Stand 6: Modified independent (Device/Increase time)    Stand to Sit 6: Modified independent (Device/Increase time)      Ambulation/Gait   Ambulation/Gait Yes    Ambulation/Gait Assistance 5: Supervision;4: Min guard    Ambulation/Gait Assistance Details compelted ambulation x 575 ft, PT providing verbal/tactile cues for improved step length and improved arm swing with gait. patient demo  2 instances of decreased step length and cadence, requiring verbal cues from PT to stop, reset to avoid shuffled steps and increased fall risk.     Ambulation Distance (Feet) 575 Feet    Assistive device None    Gait Pattern Step-through pattern;Decreased arm swing - right;Decreased arm swing - left;Decreased step length - right;Decreased step length - left;Ataxic;Wide base of support    Ambulation Surface Level;Indoor      Neuro Re-ed    Neuro Re-ed Details  completed alternating hip/arm flexion in seated position to promote improved coordination, completed x 15 reps alternating, verbal cues to keep arm extended with completion. In supine position completed the following activites to promote improved core control and coordination: including supine alternating marching x 15, progressing to addition of UE flexion to progress to dead bug position  x 10 reps, max verbal cues required for completion. Standing at countertop without UE support, completed stepping strategy activity to forward/lateral/backwards on RLE x 10 reps each direction. increased balance challenge noted with backwards stepping. With floor ladder, completed side stepping along countert with intermittent UE support x 4 laps, down and back. Progressed to completing forward ambulation focused on reciprocal steps on agility ladder x 5 reps down and back, increased ability  to demo reciprocal stepping with increased repetitions.                     PT Short Term Goals - 12/08/19 1953      PT SHORT TERM GOAL #1   Title Patient will be independent with Initial HEP for balance/strength (ALL STGs: 12/05/2019)    Baseline Patient reports independence with HEP, and completing daily    Time 4    Period Weeks    Status Achieved    Target Date 12/05/19      PT SHORT TERM GOAL #2   Title Patient will undergo further balance assessment with BERG Balance and LTG to be set as appropriate    Baseline Berg performed on 11/12/19 visit    Time  4    Period Weeks    Status Achieved      PT SHORT TERM GOAL #3   Title Patient will demo ability to complete 5x sit <> stand in </= 20 seconds with UE support to demo improved functional strength/mobility    Baseline 23.84 secs, 18.65 secs w/ UE support    Time 4    Period Weeks    Status Achieved      PT SHORT TERM GOAL #4   Title Patient will demo ability to complete TUG </= 15 seconds to demonstrate reduced fall risk    Baseline 16.75, 13 secs w/o AD    Time 4    Period Weeks    Status Achieved             PT Long Term Goals - 12/08/19 1954      PT LONG TERM GOAL #1   Title Patient will be independent with final HEP for balance/strengthening (ALL LTGs Due: 01/02/20)    Baseline no HEP established    Time 8    Period Weeks    Status New      PT LONG TERM GOAL #2   Title Pt will increase Berg score from 38/56 to >42/56 for improved balance and decreased fall risk.    Baseline 11/12/19 38/56    Time 8    Period Weeks    Status New      PT LONG TERM GOAL #3   Title Patient will improve TUG </= 12 seconds to demonstrate reduced fall risk    Baseline 16.75 secs    Time 8    Period Weeks    Status New      PT LONG TERM GOAL #4   Title Patient will improve 5x sit<> stand to </= 15 seconds with UE to demo improved functional mobility    Baseline 23.84 secs    Time 8    Period Weeks    Status New      PT LONG TERM GOAL #5   Title Patient will improve gait speed to >/= 2.5 ft/sec with LRAD to demo improved community ambulation    Baseline 1.86 ft/sec    Time 8    Period Weeks    Status New      PT LONG TERM GOAL #6   Title Patient will demo ability to ascend/descend 12 stairs, alternating pattern, with supervision and single hand rail to allow for improved functional mobility    Baseline CGA and bilateral rails    Time 8    Period Weeks    Status New                 Plan - 12/18/19  1840    Clinical Impression Statement Today's skilled session  included continued gait activites to promote improved step length and reciprocal gait pattern. Continued seated/supine activites to promote improve core stability/control and coordination, increased difficulty with dead bug today requiring max verbal cues for technique. Will continue to progress toward all goals.    Personal Factors and Comorbidities Comorbidity 1;Time since onset of injury/illness/exacerbation    Comorbidities HTN    Examination-Activity Limitations Bend;Carry;Caring for Others;Stairs;Stand;Locomotion Level;Squat;Transfers    Examination-Participation Restrictions Occupation;Community Activity;Yard Work    Conservation officer, historic buildings Evolving/Moderate complexity    Rehab Potential Fair    PT Frequency 2x / week    PT Duration 8 weeks    PT Treatment/Interventions ADLs/Self Care Home Management;Aquatic Therapy;Electrical Stimulation;Moist Heat;Contrast Bath;DME Instruction;Gait training;Functional mobility training;Neuromuscular re-education;Stair training;Therapeutic activities;Patient/family education;Therapeutic exercise;Balance training;Orthotic Fit/Training;Manual techniques;Passive range of motion    PT Next Visit Plan Continue to work on gait, balance and coordination activities. Dynamic Seated balance activites (on green air disc). Add in more core activities with balance.    Consulted and Agree with Plan of Care Patient           Patient will benefit from skilled therapeutic intervention in order to improve the following deficits and impairments:  Abnormal gait, Decreased coordination, Difficulty walking, Decreased safety awareness, Decreased activity tolerance, Impaired sensation, Postural dysfunction, Decreased strength, Decreased mobility, Decreased balance, Decreased knowledge of use of DME, Decreased endurance  Visit Diagnosis: Other abnormalities of gait and mobility  Muscle weakness (generalized)  Unsteadiness on feet  Ataxic gait     Problem  List Patient Active Problem List   Diagnosis Date Noted  . Anterior dislocation of right shoulder 03/01/2016    Tempie Donning, PT, DPT 12/18/2019, 6:42 PM  [facility] [address] [place], [place], [address] Phone: [phone]   Fax:  [phone]  Name: Samuel Stanley MRN: [id number] Date of Birth: 13-Mar-1991

## 2019-12-23 ENCOUNTER — Other Ambulatory Visit: Payer: Self-pay

## 2019-12-23 ENCOUNTER — Ambulatory Visit: Payer: Commercial Managed Care - PPO | Admitting: Speech Pathology

## 2019-12-23 ENCOUNTER — Ambulatory Visit: Payer: Commercial Managed Care - PPO

## 2019-12-23 DIAGNOSIS — R260 Ataxic gait: Secondary | ICD-10-CM | POA: Diagnosis not present

## 2019-12-23 DIAGNOSIS — R2681 Unsteadiness on feet: Secondary | ICD-10-CM

## 2019-12-23 DIAGNOSIS — R2689 Other abnormalities of gait and mobility: Secondary | ICD-10-CM

## 2019-12-23 DIAGNOSIS — M6281 Muscle weakness (generalized): Secondary | ICD-10-CM

## 2019-12-23 DIAGNOSIS — R471 Dysarthria and anarthria: Secondary | ICD-10-CM

## 2019-12-23 NOTE — Therapy (Signed)
[facility] [address] [place], [place], [address] Phone: [phone]   Fax:  [phone]  Speech Language Pathology Treatment  Patient Details  Name: Samuel Stanley MRN: [id number] Date of Birth: 01-Apr-1990 Referring Provider (SLP): Francine Graven, MD   Encounter Date: 12/23/2019   End of Session - 12/23/19 1629    Visit Number 7    Number of Visits 13    Date for SLP Re-Evaluation 01/09/20    SLP Start Time 1618    SLP Stop Time  1655    SLP Time Calculation (min) 37 min    Activity Tolerance Patient tolerated treatment well           Past Medical History:  Diagnosis Date  . Hypertension     Past Surgical History:  Procedure Laterality Date  . HERNIA REPAIR    . testicular torsion      There were no vitals filed for this visit.   Subjective Assessment - 12/23/19 1621    Subjective "I saw my grandparents, and I slowed it down for them."    Currently in Pain? No/denies                 ADULT SLP TREATMENT - 12/23/19 1637      General Information   Behavior/Cognition Alert;Cooperative;Other (comment)      Treatment Provided   Treatment provided Cognitive-Linquistic      Pain Assessment   Pain Assessment No/denies pain      Cognitive-Linquistic Treatment   Treatment focused on Dysarthria;Patient/family/caregiver education    Skilled Treatment Pt denies needing to use assistive technology for communication breakdowns since last visit; "I just slow down, and they can understand me." Decided he does not want to work on voice banking at this time. We targeted intelligibility at conversation level; pt used slow rate for >95% intelligibility during 10 minutes conversation. During conversation pt frequently used photos on his phone to supplement his speech.      Assessment / Recommendations / Plan   Plan Continue with current plan of care      Progression Toward Goals   Progression toward  goals Progressing toward goals              SLP Short Term Goals - 12/23/19 1629      SLP SHORT TERM GOAL #1   Title Pt will demo 90% functional communication over 8 minutes simple conversation in 2 sessions    Baseline 12/11/19 10/721    Time 2    Period Weeks    Status Achieved      SLP SHORT TERM GOAL #2   Title pt will complete dysarthria HEP for speech intelligibility ("tongue twisters") with 75% use of compensations, in 2 sessions    Baseline 12/11/19 12/18/19    Time 2    Period Weeks    Status Achieved      SLP SHORT TERM GOAL #3   Title Pt will use alternative or multimodal means when necessary (text-to-speech, writing, etc) to clarify unintelligible speech in 3 sessions with min cues.    Baseline 12/18/19  not necessary 12/23/19    Time 1    Period Weeks    Status On-going            SLP Long Term Goals - 12/23/19 1631      SLP LONG TERM GOAL #1   Title pt will demo 10 mintues of simple conversation with >90%  intelligibilty (functionally) x3 visits  Baseline 12/18/19, 12/23/19    Time 3   or 13 total visits, for all LTGs   Period Weeks   or 13 total visits, for all LTGs   Status On-going      SLP LONG TERM GOAL #2   Title pt will report ordering at a fast food restaurant x3 with one repeat necessary or using AAC    Time 4    Period Weeks    Status Deferred   Using the app to order ahead     SLP LONG TERM GOAL #3   Title pt's speech related QOL measure will be <85 indicating less burden from his speech on QOL than at date of evaluation    Time 3    Period Weeks    Status On-going      SLP LONG TERM GOAL #4   Title Pt will report using alternative or multimodal means to resolve a communication breakdown at work, home, or in the community x 3.    Baseline 12/16/19 12/18/19    Time 3    Period Weeks    Status On-going            Plan - 12/23/19 1653    Clinical Impression Statement Pt presents with mod ataxic dysarthria; intelligibility greatly  improved in session today, 95% for this trained listener with pt using slower rate. Pt reports he is comfortable using text-to-speech app on his phone ,which he reports has been helfpul to him to clarify unintelligible speech/ resolve communication breakdowns. Pt declines to consider voice banking or AAC device at this time, but is aware he can follow up with SLP in further should he change his mind. Progressing toward goals and anticipate d/c next visit. SLP believes pt would benefit from skilled ST at this time, targeting speech intelligibility strategies and a home exercise program to begin to make compensations for more intelligibile speech habitual for pt.    Speech Therapy Frequency 2x / week    Duration --   6 weeks or 13 total sessions   Treatment/Interventions Environmental controls;Other (comment);SLP instruction and feedback;Compensatory strategies;Patient/family education;Internal/external aids    Potential to Achieve Goals Good    Potential Considerations Severity of impairments           Patient will benefit from skilled therapeutic intervention in order to improve the following deficits and impairments:   Ataxic dysarthria    Problem List Patient Active Problem List   Diagnosis Date Noted  . Anterior dislocation of right shoulder 03/01/2016   Rondel Baton, MS, CCC-SLP Speech-Language Pathologist   Arlana Lindau 12/23/2019, 4:55 PM  [facility] [address] [place], [place], [address] Phone: [phone]   Fax:  [phone]   Name: Samuel Stanley MRN: [id number] Date of Birth: Jul 22, 1990

## 2019-12-23 NOTE — Therapy (Signed)
[facility] [address] [place], [place], [address] Phone: [phone]   Fax:  [phone]  Physical Therapy Treatment  Patient Details  Name: Samuel Stanley MRN: [id number] Date of Birth: 04-24-1990 Referring Provider (PT): Francine Graven, DO   Encounter Date: 12/23/2019   PT End of Session - 12/23/19 1703    Visit Number 12    Number of Visits 17    Date for PT Re-Evaluation 02/05/20   POC for 8 weeks, Cert for 90 days   Authorization Type [employer]    PT Start Time 1659    PT Stop Time 1743    PT Time Calculation (min) 44 min    Equipment Utilized During Treatment Gait belt    Activity Tolerance Patient tolerated treatment well    Behavior During Therapy WFL for tasks assessed/performed           Past Medical History:  Diagnosis Date  . Hypertension     Past Surgical History:  Procedure Laterality Date  . HERNIA REPAIR    . testicular torsion      There were no vitals filed for this visit.   Subjective Assessment - 12/23/19 1701    Subjective Patient reports no new changes/complaints since last visit. Just finished up with Speech Therapy. No falls.    Patient is accompained by: Family member   Wife - [name]   Pertinent History Hypertension    Limitations Walking;Standing;House hold activities    Patient Stated Goals Being able to walk and stand    Currently in Pain? No/denies                             [facility] Adult PT Treatment/Exercise - 12/23/19 0001      Transfers   Transfers Sit to Stand;Stand to Sit    Sit to Stand 5: Supervision    Stand to Sit 5: Supervision    Comments completed sit <> stand with 3# weighted ball 2 x 10 reps, verbal cues for control with descent and keeping feet shoulder width apart.       Ambulation/Gait   Ambulation/Gait Yes    Ambulation/Gait Assistance 5: Supervision    Ambulation/Gait Assistance Details completed ambulation x 450 ft with  PT providing verbal cues for improved step length and slowed pace to allow for improved step length. PT providing verbal/tactile cues for relaxation of arms to promote improved arm swing with ambulation.     Ambulation Distance (Feet) 450 Feet    Assistive device None    Gait Pattern Step-through pattern;Decreased arm swing - right;Decreased arm swing - left;Decreased step length - right;Decreased step length - left;Ataxic;Wide base of support    Ambulation Surface Level;Indoor      Neuro Re-ed    Neuro Re-ed Details  At countertop with intermittent UE support as needed completed the following: with floor ladder completed reciprocal stepping forward x 6 laps, down and back. Patient demo improved ability to complete reciprocal stepping and maintain balance during completion. Progressed to completing side stepping in floor ladder x 6 laps, down and back. intermittent UE support and verbal cues for improved step length required. Standing outside of agility ladder, completed alteranting steps in/out of box working to promote anterior and posterior stepping. Increased challenge noted with posterior stepping as patient often ER foot to increase BOS, able to correct with tactile cues. COmpleted x 10 reps each direction. In // bars on large rockerboard:  completed diagonal weight shift with staggered stance with intermittent UE support, x 15 reps. followed by alternating foot position x 15 reps. increased challenge noted without UE support. progressed to board positioned laterally, completed static standing with feet shoulder width apart, working on holding steady x 3 minutes then progressed to weight shift to R/L x 15 reps each direction, increased challenge noted with this, but able to compelte without UE support and CGA from PT>                      PT Short Term Goals - 12/08/19 1953      PT SHORT TERM GOAL #1   Title Patient will be independent with Initial HEP for balance/strength (ALL STGs:  12/05/2019)    Baseline Patient reports independence with HEP, and completing daily    Time 4    Period Weeks    Status Achieved    Target Date 12/05/19      PT SHORT TERM GOAL #2   Title Patient will undergo further balance assessment with BERG Balance and LTG to be set as appropriate    Baseline Berg performed on 11/12/19 visit    Time 4    Period Weeks    Status Achieved      PT SHORT TERM GOAL #3   Title Patient will demo ability to complete 5x sit <> stand in </= 20 seconds with UE support to demo improved functional strength/mobility    Baseline 23.84 secs, 18.65 secs w/ UE support    Time 4    Period Weeks    Status Achieved      PT SHORT TERM GOAL #4   Title Patient will demo ability to complete TUG </= 15 seconds to demonstrate reduced fall risk    Baseline 16.75, 13 secs w/o AD    Time 4    Period Weeks    Status Achieved             PT Long Term Goals - 12/08/19 1954      PT LONG TERM GOAL #1   Title Patient will be independent with final HEP for balance/strengthening (ALL LTGs Due: 01/02/20)    Baseline no HEP established    Time 8    Period Weeks    Status New      PT LONG TERM GOAL #2   Title Pt will increase Berg score from 38/56 to >42/56 for improved balance and decreased fall risk.    Baseline 11/12/19 38/56    Time 8    Period Weeks    Status New      PT LONG TERM GOAL #3   Title Patient will improve TUG </= 12 seconds to demonstrate reduced fall risk    Baseline 16.75 secs    Time 8    Period Weeks    Status New      PT LONG TERM GOAL #4   Title Patient will improve 5x sit<> stand to </= 15 seconds with UE to demo improved functional mobility    Baseline 23.84 secs    Time 8    Period Weeks    Status New      PT LONG TERM GOAL #5   Title Patient will improve gait speed to >/= 2.5 ft/sec with LRAD to demo improved community ambulation    Baseline 1.86 ft/sec    Time 8    Period Weeks    Status New      PT LONG  TERM GOAL #6   Title  Patient will demo ability to ascend/descend 12 stairs, alternating pattern, with supervision and single hand rail to allow for improved functional mobility    Baseline CGA and bilateral rails    Time 8    Period Weeks    Status New                 Plan - 12/23/19 1835    Clinical Impression Statement Continued gait training and activites with ambulation to promote improved reciprocal stepping and imporved balance. Patient continues to demo improvements with reciprocal stepping. Contineud activity to promote weight shift and coordination on rockerboard with patient doing well. Will continue to progress toward all goals.    Personal Factors and Comorbidities Comorbidity 1;Time since onset of injury/illness/exacerbation    Comorbidities HTN    Examination-Activity Limitations Bend;Carry;Caring for Others;Stairs;Stand;Locomotion Level;Squat;Transfers    Examination-Participation Restrictions Occupation;Community Activity;Yard Work    Conservation officer, historic buildings Evolving/Moderate complexity    Rehab Potential Fair    PT Frequency 2x / week    PT Duration 8 weeks    PT Treatment/Interventions ADLs/Self Care Home Management;Aquatic Therapy;Electrical Stimulation;Moist Heat;Contrast Bath;DME Instruction;Gait training;Functional mobility training;Neuromuscular re-education;Stair training;Therapeutic activities;Patient/family education;Therapeutic exercise;Balance training;Orthotic Fit/Training;Manual techniques;Passive range of motion    PT Next Visit Plan Re-Cert due Next Week. Continue to work on gait, balance and coordination activities. Dynamic Seated balance activites (on green air disc). Add in more core activities with balance.    Consulted and Agree with Plan of Care Patient           Patient will benefit from skilled therapeutic intervention in order to improve the following deficits and impairments:  Abnormal gait, Decreased coordination, Difficulty walking, Decreased safety  awareness, Decreased activity tolerance, Impaired sensation, Postural dysfunction, Decreased strength, Decreased mobility, Decreased balance, Decreased knowledge of use of DME, Decreased endurance  Visit Diagnosis: Other abnormalities of gait and mobility  Muscle weakness (generalized)  Unsteadiness on feet  Ataxic gait     Problem List Patient Active Problem List   Diagnosis Date Noted  . Anterior dislocation of right shoulder 03/01/2016    Tempie Donning, PT, DPT 12/23/2019, 6:38 PM  [facility] [address] [place], [place], [address] Phone: [phone]   Fax:  [phone]  Name: Samuel Stanley MRN: [id number] Date of Birth: [birth year]

## 2019-12-24 ENCOUNTER — Ambulatory Visit: Payer: Commercial Managed Care - PPO | Admitting: Physical Therapy

## 2019-12-25 ENCOUNTER — Other Ambulatory Visit: Payer: Self-pay

## 2019-12-25 ENCOUNTER — Ambulatory Visit: Payer: Commercial Managed Care - PPO

## 2019-12-25 ENCOUNTER — Ambulatory Visit: Payer: Commercial Managed Care - PPO | Admitting: Speech Pathology

## 2019-12-25 DIAGNOSIS — R260 Ataxic gait: Secondary | ICD-10-CM | POA: Diagnosis not present

## 2019-12-25 DIAGNOSIS — R471 Dysarthria and anarthria: Secondary | ICD-10-CM

## 2019-12-25 NOTE — Patient Instructions (Signed)
When you have discoordination or weakness that affects your speech, sometimes your swallowing can be affected as well. You did not have any signs of this today, but you should monitor for any coughing or choking when you eat or drink. If you notice this, or if you develop pneumonia or respiratory issues, your doctor may want to order a swallow test (Modified Barium Swallow Study) for you.   Signs of Aspiration Pneumonia   . Chest pain/tightness . Fever (can be low grade) . Cough  o With foul-smelling phlegm (sputum) o With sputum containing pus or blood o With greenish sputum . Fatigue  . Shortness of breath  . Wheezing   **IF YOU HAVE THESE SIGNS, CONTACT YOUR DOCTOR OR GO TO THE EMERGENCY DEPARTMENT OR URGENT CARE AS SOON AS POSSIBLE**

## 2019-12-26 NOTE — Therapy (Signed)
[facility] [address] [place], [place], [address] Phone: [phone]   Fax:  [phone]  Speech Language Pathology Treatment and Discharge Summary  Patient Details  Name: Samuel Stanley MRN: [id number] Date of Birth: 25-Feb-1991 Referring Provider (SLP): Leitha Schuller, MD   Encounter Date: 12/25/2019   End of Session - 12/25/19 1655    Visit Number 8    Number of Visits 13    Date for SLP Re-Evaluation 01/09/20    SLP Start Time 1618    SLP Stop Time  1659    SLP Time Calculation (min) 41 min    Activity Tolerance Patient tolerated treatment well           Past Medical History:  Diagnosis Date  . Hypertension     Past Surgical History:  Procedure Laterality Date  . HERNIA REPAIR    . testicular torsion      There were no vitals filed for this visit.   Subjective Assessment - 12/25/19 1633    Subjective "Like at work, they know I'm trying to do this," (slow down).    Currently in Pain? No/denies                 ADULT SLP TREATMENT - 12/25/19 1618      General Information   Behavior/Cognition Alert;Cooperative;Other (comment)   flat affect     Treatment Provided   Treatment provided Cognitive-Linquistic      Pain Assessment   Pain Assessment No/denies pain      Cognitive-Linquistic Treatment   Treatment focused on Dysarthria;Patient/family/caregiver education    Skilled Treatment SLP educated pt re: signs of aspiration and signs of aspiration PNA; pt denies any difficulties swallowing, and today passed 3 oz water swallow challenge without difficulty, no overt signs of aspiration with regular solid or thin liquids even with challenging. Educated that with discoordination or weakness affecting speech, pt's swallowing can become affected as well, handout provided with signs to monitor and report to his MD. Pt continues to report using slow rate and improved intelligibility at work and home,  fewer requests to repeat himself. He uses his phone to show a picture, or types his response into text-to-speech app if needed. Today he was intelligible >95% in 10 minutes conversation.       Assessment / Recommendations / Plan   Plan Continue with current plan of care      Dysphagia Recommendations   Diet recommendations Regular;Thin liquid    Medication Administration Whole meds with liquid      Progression Toward Goals   Progression toward goals Goals met, education completed, patient discharged from [facility] - 12/25/19 [place] #1   Title Pt will demo 90% functional communication over 8 minutes simple conversation in 2 sessions    Baseline 12/11/19 10/721    Time 2    Period Weeks    Status Achieved      SLP SHORT TERM GOAL #2   Title pt will complete dysarthria HEP for speech intelligibility ("tongue twisters") with 75% use of compensations, in 2 sessions    Baseline 12/11/19 12/18/19    Time 2    Period Weeks    Status Achieved      SLP SHORT TERM GOAL #3   Title Pt will use alternative or multimodal means when necessary (text-to-speech, writing, etc)  to clarify unintelligible speech in 3 sessions with min cues.    Baseline 12/18/19  not necessary 12/23/19    Time 1    Period Weeks    Status On-going            SLP Long Term Goals - 12/25/19 1641      SLP LONG TERM GOAL #1   Title pt will demo 10 mintues of simple conversation with >90%  intelligibilty (functionally) x3 visits    Baseline 12/18/19, 12/23/19, 12/25/19    Time 3   or 13 total visits, for all LTGs   Period Weeks   or 13 total visits, for all LTGs   Status Achieved      SLP LONG TERM GOAL #2   Title pt will report ordering at a fast food restaurant x3 with one repeat necessary or using AAC    Time 4    Period Weeks    Status Deferred   Using the app to order ahead     SLP LONG TERM GOAL #3   Title pt's speech related QOL measure will be <85  indicating less burden from his speech on QOL than at date of evaluation    Baseline --    Time 3    Period Weeks    Status Unable to assess   unsure which measure used at eval     SLP LONG TERM GOAL #4   Title Pt will report using alternative or multimodal means to resolve a communication breakdown at work, home, or in the community x 3.    Baseline 12/16/19 12/18/19, 12/25/19    Time 3    Period Weeks    Status Achieved            Plan - 12/25/19 1655    Clinical Impression Statement Pt presents with mod ataxic dysarthria; intelligibility greatly improved over last 3 sessions: 95% for this trained listener with pt using slower rate. Pt reports he is comfortable using text-to-speech app on his phone ,which he reports has been helfpul to him to clarify unintelligible speech/ resolve communication breakdowns. Pt declines to consider voice banking or AAC device at this time, but is aware he can follow up with SLP in further should he change his mind. Pt has met 2/3 LTGs and in agreement with d/c at this time as he is pleased with current functional level.    Speech Therapy Frequency 2x / week    Duration --   6 weeks or 13 total sessions   Treatment/Interventions Environmental controls;Other (comment);SLP instruction and feedback;Compensatory strategies;Patient/family education;Internal/external aids    Potential to Achieve Goals Good    Potential Considerations Severity of impairments           Patient will benefit from skilled therapeutic intervention in order to improve the following deficits and impairments:   Ataxic dysarthria    Problem List Patient Active Problem List   Diagnosis Date Noted  . Anterior dislocation of right shoulder 03/01/2016   SPEECH THERAPY DISCHARGE SUMMARY  Visits from Start of Care: 8  Current functional level related to goals / functional outcomes: With use of compensations (independent), pt is 95% intelligible in 10 minute conversation.     Remaining deficits: Moderate ataxic dysarthria   Education / Equipment: Notify MD if signs of swallowing difficulty, text-to-speech options, voice-banking/AAC an option if pt wishes to pursue this in the future. Plan: Patient agrees to discharge.  Patient goals were not met. Patient is being discharged due to  meeting the stated rehab goals.  ?????        Deneise Lever, [place], CCC-SLP Speech-Language Pathologist  Aliene Altes 12/26/2019, 6:48 AM  [facility] [address] [place], [place], [address] Phone: [phone]   Fax:  [phone]   Name: Chang Tiggs MRN: [id number] Date of Birth: [birth year]

## 2020-01-01 ENCOUNTER — Encounter: Payer: Self-pay | Admitting: Occupational Therapy

## 2020-01-01 ENCOUNTER — Ambulatory Visit: Payer: Commercial Managed Care - PPO

## 2020-01-01 ENCOUNTER — Other Ambulatory Visit: Payer: Self-pay

## 2020-01-01 ENCOUNTER — Ambulatory Visit: Payer: Commercial Managed Care - PPO | Admitting: Occupational Therapy

## 2020-01-01 DIAGNOSIS — R278 Other lack of coordination: Secondary | ICD-10-CM

## 2020-01-01 DIAGNOSIS — M6281 Muscle weakness (generalized): Secondary | ICD-10-CM

## 2020-01-01 DIAGNOSIS — R41844 Frontal lobe and executive function deficit: Secondary | ICD-10-CM

## 2020-01-01 DIAGNOSIS — R270 Ataxia, unspecified: Secondary | ICD-10-CM

## 2020-01-01 DIAGNOSIS — R41842 Visuospatial deficit: Secondary | ICD-10-CM

## 2020-01-01 DIAGNOSIS — R41840 Attention and concentration deficit: Secondary | ICD-10-CM

## 2020-01-01 DIAGNOSIS — R260 Ataxic gait: Secondary | ICD-10-CM | POA: Diagnosis not present

## 2020-01-01 DIAGNOSIS — R2681 Unsteadiness on feet: Secondary | ICD-10-CM

## 2020-01-01 DIAGNOSIS — R2689 Other abnormalities of gait and mobility: Secondary | ICD-10-CM

## 2020-01-01 NOTE — Therapy (Signed)
[facility] [address] [place], [place], [address] Phone: [phone]   Fax:  [phone]  Physical Therapy Treatment/Re-Certification  Patient Details  Name: Samuel Stanley MRN: [id number] Date of Birth: Apr 15, 1990 Referring Provider (PT): Leitha Schuller, DO   Encounter Date: 01/01/2020   PT End of Session - 01/01/20 1701    Visit Number 13    Number of Visits 19    Date for PT Re-Evaluation 03/01/20   POC for 4 weeks, Cert for 60 days   Authorization Type [employer] (VL: 20)    PT Start Time 1700    PT Stop Time 1745    PT Time Calculation (min) 45 min    Equipment Utilized During Treatment Gait belt    Activity Tolerance Patient tolerated treatment well    Behavior During Therapy WFL for tasks assessed/performed           Past Medical History:  Diagnosis Date  . Hypertension     Past Surgical History:  Procedure Laterality Date  . HERNIA REPAIR    . testicular torsion      There were no vitals filed for this visit.   Subjective Assessment - 01/01/20 1701    Subjective Patient reports is finished up with Speech Therapy. Just had OT evaluation today. No falls. 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 Patient reports he will be going to genetic clinic.    Patient is accompained by: Family member   Wife - [name]   Pertinent History Hypertension    Limitations Walking;Standing;House hold activities    Patient Stated Goals Being able to walk and stand    Currently in Pain? No/denies                [facility] Adult PT Treatment/Exercise - 01/01/20 0001      Transfers   Transfers Sit to Stand;Stand to Sit    Sit to Stand 5: Supervision    Five time sit to stand comments  18.1 secs w/ UE support, 19.2 secs w/o UE support.     Stand to Sit 5: Supervision      Ambulation/Gait   Ambulation/Gait Yes    Ambulation/Gait Assistance 5:  Supervision    Ambulation/Gait Assistance Details completed ambulation x 200 ft without AD, improved step length noted.     Ambulation Distance (Feet) 200 Feet    Assistive device None    Gait Pattern Step-through pattern;Decreased arm swing - right;Decreased arm swing - left;Decreased step length - right;Decreased step length - left;Ataxic;Wide base of support    Ambulation Surface Level;Indoor    Gait velocity 10.42 secs = 3.14 ft/sec    Stairs Yes    Stairs Assistance 5: Supervision;4: Min guard    Stairs Assistance Details (indicate cue type and reason) completed ascending/descending stairs with single rail on R side x 12 stairs with alternating pattern. intermittent CGA and supervisoin required for safety.     Stair Management Technique One rail Right;Alternating pattern;Forwards    Number of Stairs 12    Height of Stairs 6      Standardized Balance Assessment   Standardized Balance Assessment Berg Balance Test;Timed Up and Go Test      Berg Balance Test   Sit to Stand Able to stand without using hands and stabilize independently    Standing Unsupported Able to stand safely 2 minutes    Sitting with Back Unsupported but Feet Supported  on Floor or Stool Able to sit safely and securely 2 minutes    Stand to Sit Controls descent by using hands    Transfers Able to transfer safely, minor use of hands    Standing Unsupported with Eyes Closed Able to stand 10 seconds with supervision    Standing Ubsupported with Feet Together Able to place feet together independently and stand for 1 minute with supervision    From Standing, Reach Forward with Outstretched Arm Can reach confidently >25 cm (10")    From Standing Position, Pick up Object from [place] to pick up shoe safely and easily    From Standing Position, Turn to Look Behind Over each Shoulder Looks behind one side only/other side shows less weight shift    Turn 360 Degrees Able to turn 360 degrees safely but slowly    Standing  Unsupported, Alternately Place Feet on Step/Stool Able to complete 4 steps without aid or supervision    Standing Unsupported, One Foot in Front Able to plae foot ahead of the other independently and hold 30 seconds    Standing on One Leg Tries to lift leg/unable to hold 3 seconds but remains standing independently    Total Score 44      Timed Up and Go Test   TUG Normal TUG    Normal TUG (seconds) 11.38   w/o AD                 PT Education - 01/01/20 1912    Education Details progress toward LTG's, updated POC, aquatic/land visits    Person(s) Educated Patient    Methods Explanation    Comprehension Verbalized understanding            PT Short Term Goals - 12/08/19 1953      PT SHORT TERM GOAL #1   Title Patient will be independent with Initial HEP for balance/strength (ALL STGs: 12/05/2019)    Baseline Patient reports independence with HEP, and completing daily    Time 4    Period Weeks    Status Achieved    Target Date 12/05/19      PT SHORT TERM GOAL #2   Title Patient will undergo further balance assessment with BERG Balance and LTG to be set as appropriate    Baseline Berg performed on 11/12/19 visit    Time 4    Period Weeks    Status Achieved      PT SHORT TERM GOAL #3   Title Patient will demo ability to complete 5x sit <> stand in </= 20 seconds with UE support to demo improved functional strength/mobility    Baseline 23.84 secs, 18.65 secs w/ UE support    Time 4    Period Weeks    Status Achieved      PT SHORT TERM GOAL #4   Title Patient will demo ability to complete TUG </= 15 seconds to demonstrate reduced fall risk    Baseline 16.75, 13 secs w/o AD    Time 4    Period Weeks    Status Achieved             PT Long Term Goals - 01/01/20 1708      PT LONG TERM GOAL #1   Title Patient will be independent with final HEP for balance/strengthening (ALL LTGs Due: 01/02/20)    Baseline reports independence/compliance with HEP    Time 8     Period Weeks    Status Achieved  PT LONG TERM GOAL #2   Title Pt will increase Berg score from 38/56 to >42/56 for improved balance and decreased fall risk.    Baseline 11/12/19 38/56, 44/56    Time 8    Period Weeks    Status Achieved      PT LONG TERM GOAL #3   Title Patient will improve TUG </= 12 seconds to demonstrate reduced fall risk    Baseline 16.75 secs, 11.38 secs    Time 8    Period Weeks    Status Achieved      PT LONG TERM GOAL #4   Title Patient will improve 5x sit<> stand to </= 15 seconds with UE to demo improved functional mobility    Baseline 23.84 secs, 18.1 secs w/ UE support    Time 8    Period Weeks    Status Not Met      PT LONG TERM GOAL #5   Title Patient will improve gait speed to >/= 2.5 ft/sec with LRAD to demo improved community ambulation    Baseline 1.86 ft/sec, 3.14 ft/sec    Time 8    Period Weeks    Status Achieved      PT LONG TERM GOAL #6   Title Patient will demo ability to ascend/descend 12 stairs, alternating pattern, with supervision and single hand rail to allow for improved functional mobility    Baseline single rail, 12 stairs, alteranting pattern, intermittent CGA/supervision    Time 8    Period Weeks    Status Partially Met           Updated Short Term Goals:   PT Short Term Goals - 01/01/20 1919      PT SHORT TERM GOAL #1   Title = LTGs           Updated Long Term Goals:   PT Long Term Goals - 01/01/20 1919      PT LONG TERM GOAL #1   Title Patient will be independent with final HEP for balance/strengthening (ALL LTGs Due: 01/29/20)    Baseline continue to progress HEP    Time 4    Period Weeks    Status Revised    Target Date 01/29/20      PT LONG TERM GOAL #2   Title Pt will increase Berg score >/= 46/56 to demonstrate improved balance and decreased fall risk.    Baseline 11/12/19 38/56, 44/56    Time 4    Period Weeks    Status Revised      PT LONG TERM GOAL #3   Title Patient will demo ability to  ascend/descend 12 stairs with supervision to allow for improved functional mobility    Baseline single rail, 12 stairs, alteranting pattern, intermittent CGA/supervision    Time 4    Period Weeks    Status Revised      PT LONG TERM GOAL #4   Title Patient will improve 5x sit<> stand to </= 15 seconds with UE to demo improved functional mobility    Baseline 23.84 secs, 18.1 secs w/ UE support    Time 4    Period Weeks    Status On-going               Plan - 01/01/20 1915    Clinical Impression Statement Today's skilled PT session included assesment of patient's progress toward all LTG's. Patient able to meet LTG # 1,2,3 and 5. Patient able to partially meet LTG #6. Patient did not  meet LTG #4, regarding 5x sit <> stand test but did demonstrate progress toward this goal during session. patient demonstrates improved balance with Berg Balance score of 44/56, but still deemed high fall risk at this time. Patient is currently ambulating at 3.14 ft/sec, demonstrating community ambulator. Patient will continue to benefit from skilled PT services to continue to address balance and coordination deficits at this time.    Personal Factors and Comorbidities Comorbidity 1;Time since onset of injury/illness/exacerbation    Comorbidities HTN    Examination-Activity Limitations Bend;Carry;Caring for Others;Stairs;Stand;Locomotion Level;Squat;Transfers    Examination-Participation Restrictions Occupation;Community Activity;Yard Work    [employer] Evolving/Moderate complexity    Rehab Potential Fair    PT Frequency 2x / week    PT Duration 3 weeks   followed by 1x/week for 1 week   PT Treatment/Interventions ADLs/Self Care Home Management;Aquatic Therapy;Electrical Stimulation;Moist Heat;Contrast Bath;DME Instruction;Gait training;Functional mobility training;Neuromuscular re-education;Stair training;Therapeutic activities;Patient/family education;Therapeutic exercise;Balance  training;Orthotic Fit/Training;Manual techniques;Passive range of motion    PT Next Visit Plan Continue to work on gait, balance and coordination activities. Dynamic Seated balance activites (on green air disc). Add in more core activities with balance.    Consulted and Agree with Plan of Care Patient           Patient will benefit from skilled therapeutic intervention in order to improve the following deficits and impairments:  Abnormal gait, Decreased coordination, Difficulty walking, Decreased safety awareness, Decreased activity tolerance, Impaired sensation, Postural dysfunction, Decreased strength, Decreased mobility, Decreased balance, Decreased knowledge of use of DME, Decreased endurance  Visit Diagnosis: Muscle weakness (generalized)  Other lack of coordination  Other abnormalities of gait and mobility  Unsteadiness on feet     Problem List Patient Active Problem List   Diagnosis Date Noted  . Anterior dislocation of right shoulder 03/01/2016    Jones Bales, PT, DPT 01/01/2020, 7:19 PM  [facility] [address] [place], [place], [address] Phone: [phone]   Fax:  [phone]  Name: Thaddaeus Granja MRN: [id number] Date of Birth: January 28, 1991

## 2020-01-01 NOTE — Therapy (Signed)
[facility] [address] [place], [place], [address] Phone: [phone]   Fax:  [phone]  Occupational Therapy Evaluation  Patient Details  Name: Samuel Stanley MRN: [id number] Date of Birth: Aug 19, 1990 Referring Provider (OT): Dr. Francine Graven   Encounter Date: 01/01/2020   OT End of Session - 01/02/20 1202    Visit Number 1    Number of Visits 17    Date for OT Re-Evaluation 02/27/20    Authorization Type [employer] $35 copay    Authorization Time Period VL: 20 OT    OT Start Time 1618    OT Stop Time 1700    OT Time Calculation (min) 42 min    Activity Tolerance Patient tolerated treatment well    Behavior During Therapy [facility] for tasks assessed/performed           Past Medical History:  Diagnosis Date  . Hypertension     Past Surgical History:  Procedure Laterality Date  . HERNIA REPAIR    . testicular torsion      There were no vitals filed for this visit.   Subjective Assessment - 01/01/20 1647    Subjective  Pt is 29 year old male presenting to outpatient neuro occupational therapy for ataxia and fine motor coordination. In 2019 he developed migraine and vertigo (room moving back and forth, not spinning) for a week or two. Was holding on to walls to prevent falls. Got progressively worse. At one point on a trip to [place] he got on all fours to go up a monument. Kept getting slower. Initially symptoms were intermittent and would feel fine for weeks before an episode would recur. Has become more of a chronic symptom since then. Speech changed/became more slurred as well. Developed a slower speech pattern so that others could understand him better. Prior to 2019 he had no symptoms. Pt currently diagnoses at recent [facility] appt with TBP gene mutation. Pt has PMH of hypertension. Pt reports he does not know what OT is. Pt reports he wants to be able to hold his 59 month old son and stand up and be safe. He is working on  "being easy" and gentle  with holding him.    Pertinent History Hypertension    Limitations Fall.    Patient Stated Goals "I didn't even know I had it" - After assessment pt said he wants to "get faster". "hold my son and stand up"                [facility] OT Assessment - 01/01/20 1620      Assessment   Medical Diagnosis Ataxia    Referring Provider (OT) Dr. Francine Graven    Onset Date/Surgical Date --   noticed symptoms beginning June 2019   Hand Dominance Left    Prior Therapy Outpatient PT (for foot)       Precautions   Precautions Fall      Balance Screen   Has the patient fallen in the past 6 months No   seeing PT     Home  Environment   Family/patient expects to be discharged to: Private residence    Living Arrangements Spouse/significant other   and a son (10 mos)   Available Help at Discharge Family    Type of Home House    Home Access Stairs    Home Layout One level    Bathroom Shower/Tub Walk-in Shower    Bathroom Toilet Standard    Bathroom Accessibility Yes  Home Equipment Grab bars - tub/shower    Lives With Family      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 Pt reports that he does not receive any assistance for any of the ADL categories.      IADL   Shopping Takes care of all shopping needs independently   spouse and pt complete together    Light Housekeeping Maintains house alone or with occasional assistance   spouse and pt complete together    Meal Prep Plans, prepares and serves adequate meals independently   spouse helps some   Community Mobility Drives own vehicle    Medication Management Is responsible for taking medication in correct dosages at correct time    Financial Management Dependent   spouse does and did prior     Mobility   Mobility Status Comments Pt with instability and unsteadiness d/t ataxia and apraxia      Written Expression   Dominant Hand Left    Handwriting 90% legible;Increased time   pt gets very close to paper for handwriting task   Written Experience --   pt reports it's like "chicken scratch" and that it was befor     Vision - History   Baseline Vision Wears glasses all the time      Vision Assessment   Comment Continue to assess vision. Pt reports he has an eye doc appt in February but reports no changes in vision.      Cognition   Overall Cognitive Status No family/caregiver present to determine baseline cognitive functioning    Awareness Impaired    Awareness Impairment Other (comment)   pt has poor self awareness and into deficits   Problem Solving Impaired    Behaviors Perseveration    Cognition Comments pt poor awareness into deficits. pt with slower processing speed and some deficits with apraxia. Continue to assess.      Observation/Other Assessments   Focus on Therapeutic Outcomes (FOTO)  N/A      Sensation   Light Touch Impaired by gross assessment   inconsistent with location and time of light touch -    Hot/Cold Appears Intact   pt report   Additional Comments --      Coordination   Finger Nose Finger Test impaired bilaterally    9 Hole Peg Test Right;Left    Right 9 Hole Peg Test 87.16 seconds    Left 9 Hole Peg Test 70.38 seconds    Box and Blocks RUE 28 LUE 29    Coordination Ataxia present bilaterally      ROM / Strength   AROM / PROM / Strength AROM;Strength      AROM   Overall AROM  Within functional limits for tasks performed    Overall AROM Comments AROM seems  to be [facility] for BUE      Strength   Overall Strength Within functional limits for  tasks performed    Overall Strength Comments BUE WFL                                                      Hand Function   Right Hand Grip (lbs) 64.8    Left Hand Grip (lbs) 65.2                         OT Education - 01/02/20 1127    Education Details Education provided on role and purpose of OT.    Person(s) Educated Patient    Methods Explanation    Comprehension Verbalized understanding;Need further instruction            OT Short Term Goals - 01/01/20 1842      OT SHORT TERM GOAL #1   Title Pt will be independent with HEP 01/29/20    Time 4    Period Weeks    Status New    Target Date 01/29/20      OT SHORT TERM GOAL #2   Title Pt will perform physical and cognitive task simultaneously with 90% accuracy    Time 4    Period Weeks    Status New      OT SHORT TERM GOAL #3   Title Pt will perform a meal consisting of at least 3 steps with supervision, adapted strategies and good safety awareness.    Time 4    Period Weeks    Status New      OT SHORT TERM GOAL #4   Title Pt will improve 9 hole peg test score by 5 seconds bilaterally in order to increase fine motor coordination in BUE.    Baseline RUE 87.16 seconds; LUE 70.38 seconds    Time 4    Period Weeks    Status New      OT SHORT TERM GOAL #5   Title Assess vision further and add goal PRN    Time 4    Period Weeks    Status New      OT SHORT TERM GOAL #6   Title Pt will navigate environment while carrying a plate of food or cup of liquid in either UE with minimal spills or drops in order to increase coordination.    Time 4    Period Weeks    Status New      OT SHORT TERM GOAL #7   Title Pt will demonstrate ability to hold and carry a styrofoam cup with mod I for modulation of load force with BUE.    Time 4    Period Weeks    Status New             OT Long Term Goals - 01/01/20 1850        OT LONG TERM GOAL #1   Title Pt will be independent with updated HEP 02/26/20    Time 8    Period Weeks    Status New    Target Date 02/26/20      OT LONG TERM GOAL #2   Title Pt will write 3-5 sentences with 100% legibility in appropriate time in order to increase speed for handwritten tasks.    Time 8    Period Weeks    Status New      OT  LONG TERM GOAL #3   Title Pt will perform a meal consisting of at least 3 steps with mod I, adapted strategies and good safety awareness.    Time 8    Period Weeks    Status New      OT LONG TERM GOAL #4   Title Pt will improve 9 hole peg test score by 10 seconds bilaterally in order to increase fine motor coordination in BUE.    Baseline RUE 87.16 seocnds, LUE 70.38 seconds    Time 8    Period Weeks    Status New      OT LONG TERM GOAL #5   Title Pt will navigate environment while carrying a plate of food or cup of liquid in either UE with no spills or drops in order to increase coordination.    Time 8    Period Weeks    Status New      OT LONG TERM GOAL #6   Title Pt will demonstrate ability to carry a 20 lb object with appropriate modulation with grip in order to prepare for return to holding and carrying child.    Time 8    Period Weeks    Status New      OT LONG TERM GOAL #7   Title Pt will complete work simulated tasks with 90% accuracy.    Time 8    Period Weeks    Status New      OT LONG TERM GOAL #8   Title Pt will verbalize understanding of adapted strategies for completing basic ADLs and IADLs.    Time 8    Period Weeks    Status New                 Plan - 01/01/20 1651    Clinical Impression Statement Pt is 29 year old male presenting to outpatient neuro occupational therapy for ataxia and fine motor coordination. In 2019 he developed migraine and vertigo (room moving back and forth, not spinning) for a week or two. Was holding on to walls to prevent falls. Got progressively worse. At one point on a  trip to [place] he got on all fours to go up a monument. Kept getting slower. Initially symptoms were intermittent and would feel fine for weeks before an episode would recur. Has become more of a chronic symptom since then. Speech changed/became more slurred as well. Developed a slower speech pattern so that others could understand him better. Prior to 2019 he had no symptoms. Pt currently diagnoses at recent [facility] appt with TBP gene mutation. Pt has PMH of hypertension. Pt presents with deficits in coordination and motor planning and instability and unsteadiness on feet impeding his ability to complete ADLs and IADLs indepenently. Pt demonstraes poor safety awareness and awareness into deficits as he reports he is independent in all ADLs and IADLs. Skilled occupational therapy is recommended to target listed areas of deficit in order to maximize independence and decrease caregiver burden.    OT Occupational Profile and History Detailed Assessment- Review of Records and additional review of physical, cognitive, psychosocial history related to current functional performance    Occupational performance deficits (Please refer to evaluation for details): IADL's;ADL's;Work;Leisure    Body Structure / Function / Physical Skills ADL;Decreased knowledge of use of DME;Balance;Dexterity;GMC;Gait;Strength;UE functional use;Proprioception;IADL;ROM;Vestibular;Vision;Coordination;Flexibility;Mobility;Sensation;FMC    Cognitive Skills Attention;Problem Solve;Safety Awareness;Perception;Understand;Learn    Rehab Potential Good    Clinical Decision Making Limited treatment options, no task modification necessary  Comorbidities Affecting Occupational Performance: May have comorbidities impacting occupational performance    Modification or Assistance to Complete Evaluation  No modification of tasks or assist necessary to complete eval    OT Frequency 2x / week    OT Duration 8 weeks    OT Treatment/Interventions Self-care/ADL  training;DME and/or AE instruction;Balance training;Therapeutic activities;Therapeutic exercise;Energy conservation;Neuromuscular education;Patient/family education;Visual/perceptual remediation/compensation;Functional Mobility Training;Cognitive remediation/compensation    Plan further assess vision, coordination    Consulted and Agree with Plan of Care Patient           Patient will benefit from skilled therapeutic intervention in order to improve the following deficits and impairments:   Body Structure / Function / Physical Skills: ADL, Decreased knowledge of use of DME, Balance, Dexterity, GMC, Gait, Strength, UE functional use, Proprioception, IADL, ROM, Vestibular, Vision, Coordination, Flexibility, Mobility, Sensation, [facility] Cognitive Skills: Attention, Problem Solve, Safety Awareness, Perception, Understand, Learn     Visit Diagnosis: Frontal lobe and executive function deficit - Plan: Ot plan of care cert/re-cert  Other lack of coordination - Plan: Ot plan of care cert/re-cert  Attention and concentration deficit - Plan: Ot plan of care cert/re-cert  Muscle weakness (generalized) - Plan: Ot plan of care cert/re-cert  Other abnormalities of gait and mobility - Plan: Ot plan of care cert/re-cert  Ataxia - Plan: Ot plan of care cert/re-cert  Unsteadiness on feet - Plan: Ot plan of care cert/re-cert  Visuospatial deficit - Plan: Ot plan of care cert/re-cert    Problem List Patient Active Problem List   Diagnosis Date Noted  . Anterior dislocation of right shoulder 03/01/2016    Junious Dresser MOT, OTR/L  01/02/2020, 1:10 PM  [facility] [address] [place], [place], [address] Phone: [phone]   Fax:  [phone]  Name: Exavior Kimmons MRN: [id number] Date of Birth: 07-19-1990

## 2020-01-05 ENCOUNTER — Other Ambulatory Visit: Payer: Self-pay

## 2020-01-05 ENCOUNTER — Encounter: Payer: Self-pay | Admitting: Occupational Therapy

## 2020-01-05 ENCOUNTER — Ambulatory Visit: Payer: Commercial Managed Care - PPO | Admitting: Occupational Therapy

## 2020-01-05 DIAGNOSIS — R41844 Frontal lobe and executive function deficit: Secondary | ICD-10-CM

## 2020-01-05 DIAGNOSIS — R260 Ataxic gait: Secondary | ICD-10-CM | POA: Diagnosis not present

## 2020-01-05 DIAGNOSIS — R2681 Unsteadiness on feet: Secondary | ICD-10-CM

## 2020-01-05 DIAGNOSIS — R41840 Attention and concentration deficit: Secondary | ICD-10-CM

## 2020-01-05 DIAGNOSIS — R2689 Other abnormalities of gait and mobility: Secondary | ICD-10-CM

## 2020-01-05 DIAGNOSIS — M6281 Muscle weakness (generalized): Secondary | ICD-10-CM

## 2020-01-05 DIAGNOSIS — R278 Other lack of coordination: Secondary | ICD-10-CM

## 2020-01-05 NOTE — Therapy (Signed)
[facility] [address] [place], [place], [address] Phone: [phone]   Fax:  [phone]  Occupational Therapy Treatment  Patient Details  Name: Samuel Stanley MRN: [id number] Date of Birth: [birth year] Referring Provider (OT): Dr. Francine Graven   Encounter Date: 01/05/2020   OT End of Session - 01/05/20 1704    Visit Number 2    Number of Visits 17    Date for OT Re-Evaluation 02/27/20    Authorization Type [employer] $[id number] copay    Authorization Time Period VL: 20 OT    OT Start Time 1704    OT Stop Time 1745    OT Time Calculation (min) 41 min    Activity Tolerance Patient tolerated treatment well    Behavior During Therapy [facility] for tasks assessed/performed           Past Medical History:  Diagnosis Date  . Hypertension     Past Surgical History:  Procedure Laterality Date  . HERNIA REPAIR    . testicular torsion      There were no vitals filed for this visit.   Subjective Assessment - 01/05/20 1704    Subjective  Pt denies any pain. Pt reports no changes. Pt says his weekened was "good"    Pertinent History Hypertension    Limitations Fall.    Patient Stated Goals "I didn't even know I had it" - After assessment pt said he wants to "get faster". "hold my son and stand up"    Currently in Pain? No/denies              [facility] OT Assessment - 01/05/20 1717      Vision Assessment   Ocular Range of Motion Impaired to be futher tested in functional context    Tracking/Visual Pursuits Unable to hold eye position out of midline    Saccades Decreased speed of saccadic movement   possibly from processing speed/cognition. continue to assess   Convergence Impaired (comment)    Visual Fields --   continue to assess - potential peripheral visual field loss   Comment Pt continues to report no visual change however demonstrated significant visual fatigue with visual tasks and deficits with oculomotor skills and  visual perceptual skills during actvities. Patient also reports that he has an eye appt in February and gets close to tasks while completing them.                    OT Treatments/Exercises (OP) - 01/05/20 1707      ADLs   ADL Comments pt able to carry plate with items (nothing rolling) with 2 hands and again with 1 hand - no drops.      Visual/Perceptual Exercises   Copy this Image Other    Other Parquetry directly on image with increased time and min difficulty. Pt unable to complete at near point copying this day                    OT Short Term Goals - 01/01/20 1842      OT SHORT TERM GOAL #1   Title Pt will be independent with HEP 01/29/20    Time 4    Period Weeks    Status New    Target Date 01/29/20      OT SHORT TERM GOAL #2   Title Pt will perform physical and cognitive task simultaneously with 90% accuracy    Time 4    Period Weeks  Status New      OT SHORT TERM GOAL #3   Title Pt will perform a meal consisting of at least 3 steps with supervision, adapted strategies and good safety awareness.    Time 4    Period Weeks    Status New      OT SHORT TERM GOAL #4   Title Pt will improve 9 hole peg test score by 5 seconds bilaterally in order to increase fine motor coordination in BUE.    Baseline RUE 87.16 seconds; LUE 70.38 seconds    Time 4    Period Weeks    Status New      OT SHORT TERM GOAL #5   Title Assess vision further and add goal PRN    Time 4    Period Weeks    Status New      OT SHORT TERM GOAL #6   Title Pt will navigate environment while carrying a plate of food or cup of liquid in either UE with minimal spills or drops in order to increase coordination.    Time 4    Period Weeks    Status New      OT SHORT TERM GOAL #7   Title Pt will demonstrate ability to hold and carry a styrofoam cup with mod I for modulation of load force with BUE.    Time 4    Period Weeks    Status New             OT Long Term Goals  - 01/01/20 1850      OT LONG TERM GOAL #1   Title Pt will be independent with updated HEP 02/26/20    Time 8    Period Weeks    Status New    Target Date 02/26/20      OT LONG TERM GOAL #2   Title Pt will write 3-5 sentences with 100% legibility in appropriate time in order to increase speed for handwritten tasks.    Time 8    Period Weeks    Status New      OT LONG TERM GOAL #3   Title Pt will perform a meal consisting of at least 3 steps with mod I, adapted strategies and good safety awareness.    Time 8    Period Weeks    Status New      OT LONG TERM GOAL #4   Title Pt will improve 9 hole peg test score by 10 seconds bilaterally in order to increase fine motor coordination in BUE.    Baseline RUE 87.16 seocnds, LUE 70.38 seconds    Time 8    Period Weeks    Status New      OT LONG TERM GOAL #5   Title Pt will navigate environment while carrying a plate of food or cup of liquid in either UE with no spills or drops in order to increase coordination.    Time 8    Period Weeks    Status New      OT LONG TERM GOAL #6   Title Pt will demonstrate ability to carry a 20 lb object with appropriate modulation with grip in order to prepare for return to holding and carrying child.    Time 8    Period Weeks    Status New      OT LONG TERM GOAL #7   Title Pt will complete work simulated tasks with 90% accuracy.    Time 8  Period Weeks    Status New      OT LONG TERM GOAL #8   Title Pt will verbalize understanding of adapted strategies for completing basic ADLs and IADLs.    Time 8    Period Weeks    Status New                 Plan - 01/05/20 1740    Clinical Impression Statement Progressing towards goals. Pt inconsistent with reports and awareness of deficits and demonstration.    OT Occupational Profile and History Detailed Assessment- Review of Records and additional review of physical, cognitive, psychosocial history related to current functional performance      Occupational performance deficits (Please refer to evaluation for details): IADL's;ADL's;Work;Leisure    Body Structure / Function / Physical Skills ADL;Decreased knowledge of use of DME;Balance;Dexterity;GMC;Gait;Strength;UE functional use;Proprioception;IADL;ROM;Vestibular;Vision;Coordination;Flexibility;Mobility;Sensation;FMC    Cognitive Skills Attention;Problem Solve;Safety Awareness;Perception;Understand;Learn    Rehab Potential Good    Clinical Decision Making Limited treatment options, no task modification necessary    Comorbidities Affecting Occupational Performance: May have comorbidities impacting occupational performance    Modification or Assistance to Complete Evaluation  No modification of tasks or assist necessary to complete eval    OT Frequency 2x / week    OT Duration 8 weeks    OT Treatment/Interventions Self-care/ADL training;DME and/or AE instruction;Balance training;Therapeutic activities;Therapeutic exercise;Energy conservation;Neuromuscular education;Patient/family education;Visual/perceptual remediation/compensation;Functional Mobility Training;Cognitive remediation/compensation    Plan coordination    Consulted and Agree with Plan of Care Patient           Patient will benefit from skilled therapeutic intervention in order to improve the following deficits and impairments:   Body Structure / Function / Physical Skills: ADL, Decreased knowledge of use of DME, Balance, Dexterity, GMC, Gait, Strength, UE functional use, Proprioception, IADL, ROM, Vestibular, Vision, Coordination, Flexibility, Mobility, Sensation, [facility] Cognitive Skills: Attention, Problem Solve, Safety Awareness, Perception, Understand, Learn     Visit Diagnosis: Frontal lobe and executive function deficit  Muscle weakness (generalized)  Other lack of coordination  Other abnormalities of gait and mobility  Unsteadiness on feet  Attention and concentration deficit    Problem List Patient  Active Problem List   Diagnosis Date Noted  . Anterior dislocation of right shoulder 03/01/2016    Junious Dresser MOT, OTR/L  01/05/2020, 5:55 PM  [facility] [address] [place], [place], [address] Phone: [phone]   Fax:  [phone]  Name: Samuel Stanley MRN: [id number] Date of Birth: 1990-07-20

## 2020-01-07 ENCOUNTER — Encounter: Payer: Self-pay | Admitting: Physical Therapy

## 2020-01-07 ENCOUNTER — Ambulatory Visit: Payer: Commercial Managed Care - PPO | Admitting: Physical Therapy

## 2020-01-07 ENCOUNTER — Other Ambulatory Visit: Payer: Self-pay

## 2020-01-07 DIAGNOSIS — R2689 Other abnormalities of gait and mobility: Secondary | ICD-10-CM

## 2020-01-07 DIAGNOSIS — R260 Ataxic gait: Secondary | ICD-10-CM | POA: Diagnosis not present

## 2020-01-07 DIAGNOSIS — R278 Other lack of coordination: Secondary | ICD-10-CM

## 2020-01-07 DIAGNOSIS — R2681 Unsteadiness on feet: Secondary | ICD-10-CM

## 2020-01-07 NOTE — Therapy (Signed)
[facility] [address] [place], [place], [address] Phone: [phone]   Fax:  [phone]  Physical Therapy Treatment  Patient Details  Name: Baden Betsch MRN: [id number] Date of Birth: 08-14-1990 Referring Provider (PT): Francine Graven, DO   Encounter Date: 01/07/2020   PT End of Session - 01/07/20 2009    Visit Number 14    Number of Visits 19    Date for PT Re-Evaluation 03/01/20   POC for 4 weeks, Cert for 60 days   Authorization Type [employer] (VL: 20)    PT Start Time 1530    PT Stop Time 1628    PT Time Calculation (min) 58 min    Equipment Utilized During Treatment Other (comment)   bar bells   Activity Tolerance Patient tolerated treatment well    Behavior During Therapy [facility] for tasks assessed/performed           Past Medical History:  Diagnosis Date  . Hypertension     Past Surgical History:  Procedure Laterality Date  . HERNIA REPAIR    . testicular torsion      There were no vitals filed for this visit.   Subjective Assessment - 01/07/20 2008    Subjective Pt presents for aquatic therapy at [facility] - states he and his wife went to [facility] last week and did some exercises in the pool    Patient is accompained by: Family member   Wife - [name]   Pertinent History Hypertension    Limitations Walking;Standing;House hold activities    Patient Stated Goals Being able to walk and stand    Currently in Pain? No/denies                Aquatic therapy at [facility] - pool temp. 87.4 degrees  Patient seen for aquatic therapy today.  Treatment took place in water 3.5-4 feet deep depending upon activity.  Pt entered and exited the pool via ramp negotiation due to proximity and less crowded than at step area access.  Pt performed gait training in 4' water depth 10m x 6 reps - cues for reciprocal arm swing with leg movement; bar bells used on 5th and 6th rep with cues to alternate pushing and pulling  bar bells for reciprocal UE movement  Pt performed marching in place -  able to perform without UE support on side of pool; progressed to marching forward/backward across pool, approx. 63m   Pt performed crossovers front - each leg alternating 5 reps; then attempted to step behind alternating legs but difficult for pt to do due to decr. Coordination & balance  Pt performed side stepping with small squats - with UE movement incorporated- arms out to side with stepping out to side & squat - then UE's adducted with stepping together  - 66m x 1 rep across pool  Pt performed backwards amb. 17m x1 rep with SBA - no flotation device used for UE support  Attempted 1/2 jumping jacks 10 reps   Lunges - LE's only - forward and switch; pt unable to coordinate and perform cross country skiing exercise with reciprocal arm movement with LE movement  Pt performed jogging 66m across pool x 2 reps  Ai Chi postures - soothing and freeing - pt had difficulty maintaining balance due to decr. Core stabilization - had increased trunk flexion with these postures   Pt requires buoyancy of the water for support with dynamic balance exercises and with plyometrics that can not be performed  safely on land without fall risk Water current needed for perturbations for challenges with static & dynamic standing balance; viscosity of water needed for strengthening of LE's as well as for trunk musc. Strengthening to fascilitate trunk/core stabilization                                           PT Short Term Goals - 01/01/20 1919      PT SHORT TERM GOAL #1   Title = LTGs             PT Long Term Goals - 01/07/20 2014      PT LONG TERM GOAL #1   Title Patient will be independent with final HEP for balance/strengthening (ALL LTGs Due: 01/29/20)    Baseline continue to progress HEP    Time 4    Period Weeks    Status Revised      PT LONG TERM GOAL #2   Title Pt will increase  Berg score >/= 46/56 to demonstrate improved balance and decreased fall risk.    Baseline 11/12/19 38/56, 44/56    Time 4    Period Weeks    Status Revised      PT LONG TERM GOAL #3   Title Patient will demo ability to ascend/descend 12 stairs with supervision to allow for improved functional mobility    Baseline single rail, 12 stairs, alteranting pattern, intermittent CGA/supervision    Time 4    Period Weeks    Status Revised      PT LONG TERM GOAL #4   Title Patient will improve 5x sit<> stand to </= 15 seconds with UE to demo improved functional mobility    Baseline 23.84 secs, 18.1 secs w/ UE support    Time 4    Period Weeks    Status On-going                 Plan - 01/07/20 2010    Clinical Impression Statement Aquatic therapy session focused on trunk/core stabilization, gait, balance and coordination exercises.  Pt continues to have difficulty ambulating with reciprocal arm swing in water; had some difficulty moving reciprocal arm and leg simultaneously, standing in place - required focus and concentration.  Pt continues to have decreased trunk control - has difficulty maintaining static balance with upright trunk with Ai Chi postures.    Personal Factors and Comorbidities Comorbidity 1;Time since onset of injury/illness/exacerbation    Comorbidities HTN    Examination-Activity Limitations Bend;Carry;Caring for Others;Stairs;Stand;Locomotion Level;Squat;Transfers    Examination-Participation Restrictions Occupation;Community Activity;Yard Work    Conservation officer, historic buildings Evolving/Moderate complexity    Rehab Potential Fair    PT Frequency 2x / week    PT Duration 3 weeks   followed by 1x/week for 1 week   PT Treatment/Interventions ADLs/Self Care Home Management;Aquatic Therapy;Electrical Stimulation;Moist Heat;Contrast Bath;DME Instruction;Gait training;Functional mobility training;Neuromuscular re-education;Stair training;Therapeutic activities;Patient/family  education;Therapeutic exercise;Balance training;Orthotic Fit/Training;Manual techniques;Passive range of motion    PT Next Visit Plan Continue to work on gait, balance and coordination activities. Dynamic Seated balance activites (on green air disc). Add in more core activities with balance.    Consulted and Agree with Plan of Care Patient           Patient will benefit from skilled therapeutic intervention in order to improve the following deficits and impairments:  Abnormal gait, Decreased coordination, Difficulty walking, Decreased safety awareness,  Decreased activity tolerance, Impaired sensation, Postural dysfunction, Decreased strength, Decreased mobility, Decreased balance, Decreased knowledge of use of DME, Decreased endurance  Visit Diagnosis: Other lack of coordination  Other abnormalities of gait and mobility  Unsteadiness on feet     Problem List Patient Active Problem List   Diagnosis Date Noted  . Anterior dislocation of right shoulder 03/01/2016    Haidyn Chadderdon, Donavan Burnet, PT, ATRIC 01/07/2020, 8:16 PM  [facility] [address] [place], [place], [address] Phone: [phone]   Fax:  [phone]  Name: Diyan Dave MRN: [id number] Date of Birth: 1990/05/23

## 2020-01-08 ENCOUNTER — Ambulatory Visit: Payer: Commercial Managed Care - PPO

## 2020-01-08 DIAGNOSIS — M6281 Muscle weakness (generalized): Secondary | ICD-10-CM

## 2020-01-08 DIAGNOSIS — R260 Ataxic gait: Secondary | ICD-10-CM | POA: Diagnosis not present

## 2020-01-08 DIAGNOSIS — R2689 Other abnormalities of gait and mobility: Secondary | ICD-10-CM

## 2020-01-08 DIAGNOSIS — R2681 Unsteadiness on feet: Secondary | ICD-10-CM

## 2020-01-08 DIAGNOSIS — R278 Other lack of coordination: Secondary | ICD-10-CM

## 2020-01-08 NOTE — Therapy (Signed)
[facility] [address] [place], [place], [address] Phone: [phone]   Fax:  [phone]  Physical Therapy Treatment  Patient Details  Name: Samuel Stanley MRN: [id number] Date of Birth: 02/05/1991 Referring Provider (PT): Francine Graven, DO   Encounter Date: 01/08/2020   PT End of Session - 01/08/20 1658    Visit Number 15    Number of Visits 19    Date for PT Re-Evaluation 03/01/20   POC for 4 weeks, Cert for 60 days   Authorization Type [employer] (VL: 20)    PT Start Time 1653    PT Stop Time 1738    PT Time Calculation (min) 45 min    Equipment Utilized During Treatment Other (comment)   bar bells   Activity Tolerance Patient tolerated treatment well    Behavior During Therapy [facility] for tasks assessed/performed           Past Medical History:  Diagnosis Date  . Hypertension     Past Surgical History:  Procedure Laterality Date  . HERNIA REPAIR    . testicular torsion      There were no vitals filed for this visit.   Subjective Assessment - 01/08/20 1657    Subjective Patient reports that aquatic therapy went well yesterday. Patient reports no new changes/complaints since last visit. No falls.    Patient is accompained by: Family member   Wife - [name]   Pertinent History Hypertension    Limitations Walking;Standing;House hold activities    Patient Stated Goals Being able to walk and stand    Currently in Pain? No/denies                 [facility] Adult PT Treatment/Exercise - 01/08/20 0001      Ambulation/Gait   Ambulation/Gait Yes    Ambulation/Gait Assistance 5: Supervision    Ambulation/Gait Assistance Details worked on continued gait training with focus on control during ambulation, verbal cues provided for improved step length. Patient continue to demo decreased arm swing, patient providing verbal/tactile cues for arm swing. able to complete with ambulation initiaily, but decreased  carryover with following gait throughout session.     Ambulation Distance (Feet) 460 Feet    Assistive device None    Gait Pattern Step-through pattern;Decreased arm swing - right;Decreased arm swing - left;Decreased step length - right;Decreased step length - left;Ataxic;Wide base of support    Ambulation Surface Level;Indoor    Stairs Yes    Stairs Assistance 5: Supervision    Stairs Assistance Details (indicate cue type and reason) completed ascending/descending stairs with single rail on R side x 16 stairs, alteranting pattern. patient demo improved completion, with no CGA required and improved foot position noted.     Stair Management Technique One rail Right;Alternating pattern;Forwards    Number of Stairs 16    Height of Stairs 6      Neuro Re-ed    Neuro Re-ed Details  in // bars: completed step overs orange hurdles, completd initially with UE support and patient able to complete reciprocal step over cones. Progressed to no UE support, patient demo increased difficulty with not using UE support as continously wanted to touch // bars with RUE. PT providing towel for patient to hold in BUE, increased challenge with stepping over hurdle but patient able to complete x 5 reps without UE support.                Balance Exercises - 01/08/20 1715  Balance Exercises: Standing   Standing Eyes Opened Narrow base of support (BOS);Foam/compliant surface;Limitations;Head turns;Wide (BOA)    Standing Eyes Opened Limitations with narrow BOS standing with eyes open 2 x 1 minute each, progressed to completing horizontal/vertical head turns x 5 reps each. increased difficulty noted with narrow BOS with additoin of head turns so completed with wide BOS on airex.      Standing Eyes Closed Wide (BOA);Foam/compliant surface;3 reps;30 secs;Limitations    Standing Eyes Closed Limitations standing with wide BOS on airex, completed standing without UE support 3 x 30 seconds. increaed sway noted with  eyes closed    Tandem Stance Eyes open;Intermittent upper extremity support;3 reps;30 secs;Limitations    Tandem Stance Time 3/4 tandem stance, verbal cues for posture and equal weight shift. CGA throughout.     Tandem Gait Forward;Upper extremity support;3 reps;Limitations    Tandem Gait Limitations completed in // bars with UE support x 3 laps, down and back.     Marching Solid surface;Intermittent upper extremity assist;Limitations    Marching Limitations completed standing alternating marching x 10 reps bilaterally, completed with single UE support from bars and worked on improved control with BLE with completion. Decresaed control noted without UE suppotr.     Other Standing Exercises in // bars completed ambulation on blue mat (complaint surface) to further challenge balance with gait, completed x 4 laps, down and back in // bars.                PT Short Term Goals - 01/01/20 1919      PT SHORT TERM GOAL #1   Title = LTGs             PT Long Term Goals - 01/07/20 2014      PT LONG TERM GOAL #1   Title Patient will be independent with final HEP for balance/strengthening (ALL LTGs Due: 01/29/20)    Baseline continue to progress HEP    Time 4    Period Weeks    Status Revised      PT LONG TERM GOAL #2   Title Pt will increase Berg score >/= 46/56 to demonstrate improved balance and decreased fall risk.    Baseline 11/12/19 38/56, 44/56    Time 4    Period Weeks    Status Revised      PT LONG TERM GOAL #3   Title Patient will demo ability to ascend/descend 12 stairs with supervision to allow for improved functional mobility    Baseline single rail, 12 stairs, alteranting pattern, intermittent CGA/supervision    Time 4    Period Weeks    Status Revised      PT LONG TERM GOAL #4   Title Patient will improve 5x sit<> stand to </= 15 seconds with UE to demo improved functional mobility    Baseline 23.84 secs, 18.1 secs w/ UE support    Time 4    Period Weeks     Status On-going                 Plan - 01/08/20 2003    Clinical Impression Statement Today's skilled session included continued gait training and stair training focused on improved reciprocal stepping and step length. Continue to demo difficulty with reciprocal arm swing with limited carryover. Continued balance exercises focused on complaint surfaces and activites to promote improved coordination. Will continue to progress toward all LTGs.    Personal Factors and Comorbidities Comorbidity 1;Time since onset of injury/illness/exacerbation  Comorbidities HTN    Examination-Activity Limitations Bend;Carry;Caring for Others;Stairs;Stand;Locomotion Level;Squat;Transfers    Examination-Participation Restrictions Occupation;Community Activity;Yard Work    Conservation officer, historic buildings Evolving/Moderate complexity    Rehab Potential Fair    PT Frequency 2x / week    PT Duration 3 weeks   followed by 1x/week for 1 week   PT Treatment/Interventions ADLs/Self Care Home Management;Aquatic Therapy;Electrical Stimulation;Moist Heat;Contrast Bath;DME Instruction;Gait training;Functional mobility training;Neuromuscular re-education;Stair training;Therapeutic activities;Patient/family education;Therapeutic exercise;Balance training;Orthotic Fit/Training;Manual techniques;Passive range of motion    PT Next Visit Plan Continue to work on gait, balance and coordination activities. Dynamic Seated balance activites (on green air disc). Add in more core activities with balance.    Consulted and Agree with Plan of Care Patient           Patient will benefit from skilled therapeutic intervention in order to improve the following deficits and impairments:  Abnormal gait, Decreased coordination, Difficulty walking, Decreased safety awareness, Decreased activity tolerance, Impaired sensation, Postural dysfunction, Decreased strength, Decreased mobility, Decreased balance, Decreased knowledge of use of DME,  Decreased endurance  Visit Diagnosis: Other abnormalities of gait and mobility  Unsteadiness on feet  Muscle weakness (generalized)  Other lack of coordination     Problem List Patient Active Problem List   Diagnosis Date Noted  . Anterior dislocation of right shoulder 03/01/2016    Tempie Donning, PT, DPT 01/08/2020, 8:04 PM  [facility] [address] [place], [place], [address] Phone: [phone]   Fax:  [phone]  Name: Samuel Stanley MRN: [id number] Date of Birth: 19-Feb-1991

## 2020-01-09 ENCOUNTER — Ambulatory Visit: Payer: Commercial Managed Care - PPO

## 2020-01-12 ENCOUNTER — Other Ambulatory Visit: Payer: Self-pay

## 2020-01-12 ENCOUNTER — Ambulatory Visit: Payer: Commercial Managed Care - PPO | Attending: Neurology | Admitting: Occupational Therapy

## 2020-01-12 ENCOUNTER — Encounter: Payer: Self-pay | Admitting: Occupational Therapy

## 2020-01-12 DIAGNOSIS — R41840 Attention and concentration deficit: Secondary | ICD-10-CM | POA: Insufficient documentation

## 2020-01-12 DIAGNOSIS — R41844 Frontal lobe and executive function deficit: Secondary | ICD-10-CM | POA: Diagnosis present

## 2020-01-12 DIAGNOSIS — M6281 Muscle weakness (generalized): Secondary | ICD-10-CM | POA: Insufficient documentation

## 2020-01-12 DIAGNOSIS — R2689 Other abnormalities of gait and mobility: Secondary | ICD-10-CM | POA: Diagnosis not present

## 2020-01-12 DIAGNOSIS — R260 Ataxic gait: Secondary | ICD-10-CM | POA: Diagnosis present

## 2020-01-12 DIAGNOSIS — R278 Other lack of coordination: Secondary | ICD-10-CM | POA: Diagnosis present

## 2020-01-12 DIAGNOSIS — R41842 Visuospatial deficit: Secondary | ICD-10-CM | POA: Diagnosis present

## 2020-01-12 DIAGNOSIS — R2681 Unsteadiness on feet: Secondary | ICD-10-CM | POA: Insufficient documentation

## 2020-01-12 DIAGNOSIS — R270 Ataxia, unspecified: Secondary | ICD-10-CM | POA: Diagnosis present

## 2020-01-12 NOTE — Patient Instructions (Addendum)
  Coordination Activities  Do these activities for approximately 20 minutes a day 1-2 times a day with BOTH hands.  Toss medium size ball up and catch x 10 times 2-3 times per day.  Pick up bottle caps, checkers pieces, poker chips, etc and place in bowl.  Stack cups  Flip cards over one at a time as fast as you can.  Stack pennies or coins in [employer] of 5-10  Place pennies or coins into piggy bank or container

## 2020-01-12 NOTE — Therapy (Signed)
[facility] [address] [place], [place], [address] Phone: [phone]   Fax:  [phone]  Occupational Therapy Treatment  Patient Details  Name: Samuel Stanley MRN: [id number] Date of Birth: 1990/11/19 Referring Provider (OT): Dr. Francine Graven   Encounter Date: 01/12/2020   OT End of Session - 01/12/20 1705    Visit Number 3    Number of Visits 17    Date for OT Re-Evaluation 02/27/20    Authorization Type [employer] $35 copay    Authorization Time Period VL: 20 OT    OT Start Time 1704    OT Stop Time 1745    OT Time Calculation (min) 41 min    Activity Tolerance Patient tolerated treatment well    Behavior During Therapy [facility] for tasks assessed/performed           Past Medical History:  Diagnosis Date   Hypertension     Past Surgical History:  Procedure Laterality Date   HERNIA REPAIR     testicular torsion      There were no vitals filed for this visit.   Subjective Assessment - 01/12/20 1705    Subjective  Pt denies any pain. Reports nothing new.    Pertinent History Hypertension    Limitations Fall.    Patient Stated Goals "I didn't even know I had it" - After assessment pt said he wants to "get faster". "hold my son and stand up"    Currently in Pain? No/denies                        OT Treatments/Exercises (OP) - 01/12/20 1706      Exercises   Exercises Hand      Cognitive Exercises   Attention Span Other cognitive/physical dual tasking with ambulating around clinic with word finding ABC animals. Pt required mod verbal cues for word finding while attending to ambulating this day      Visual/Perceptual Exercises   Copy this Image Pegboard    Pegboard required increased time but was able to copy pattern with little to no difficulty.      Fine Motor Coordination (Hand/Wrist)   Fine Motor Coordination Small Pegboard;Flipping cards;Picking up coins;Manipulating coins;Stacking  coins;Tossing ball    Small Pegboard MEDIUM PEGS -  required increased time for coordination with LUE    Flipping cards see pt instructions    Picking up coins -    Manipulating coins -    Stacking coins -    Tossing ball -                  OT Education - 01/12/20 1733    Education Details Provided Coordination activities to try at home - see pt instructions    Person(s) Educated Patient    Methods Explanation    Comprehension Verbalized understanding            OT Short Term Goals - 01/01/20 1842      OT SHORT TERM GOAL #1   Title Pt will be independent with HEP 01/29/20    Time 4    Period Weeks    Status New    Target Date 01/29/20      OT SHORT TERM GOAL #2   Title Pt will perform physical and cognitive task simultaneously with 90% accuracy    Time 4    Period Weeks    Status New      OT SHORT TERM GOAL #3  Title Pt will perform a meal consisting of at least 3 steps with supervision, adapted strategies and good safety awareness.    Time 4    Period Weeks    Status New      OT SHORT TERM GOAL #4   Title Pt will improve 9 hole peg test score by 5 seconds bilaterally in order to increase fine motor coordination in BUE.    Baseline RUE 87.16 seconds; LUE 70.38 seconds    Time 4    Period Weeks    Status New      OT SHORT TERM GOAL #5   Title Assess vision further and add goal PRN    Time 4    Period Weeks    Status New      OT SHORT TERM GOAL #6   Title Pt will navigate environment while carrying a plate of food or cup of liquid in either UE with minimal spills or drops in order to increase coordination.    Time 4    Period Weeks    Status New      OT SHORT TERM GOAL #7   Title Pt will demonstrate ability to hold and carry a styrofoam cup with mod I for modulation of load force with BUE.    Time 4    Period Weeks    Status New             OT Long Term Goals - 01/01/20 1850      OT LONG TERM GOAL #1   Title Pt will be independent with  updated HEP 02/26/20    Time 8    Period Weeks    Status New    Target Date 02/26/20      OT LONG TERM GOAL #2   Title Pt will write 3-5 sentences with 100% legibility in appropriate time in order to increase speed for handwritten tasks.    Time 8    Period Weeks    Status New      OT LONG TERM GOAL #3   Title Pt will perform a meal consisting of at least 3 steps with mod I, adapted strategies and good safety awareness.    Time 8    Period Weeks    Status New      OT LONG TERM GOAL #4   Title Pt will improve 9 hole peg test score by 10 seconds bilaterally in order to increase fine motor coordination in BUE.    Baseline RUE 87.16 seocnds, LUE 70.38 seconds    Time 8    Period Weeks    Status New      OT LONG TERM GOAL #5   Title Pt will navigate environment while carrying a plate of food or cup of liquid in either UE with no spills or drops in order to increase coordination.    Time 8    Period Weeks    Status New      OT LONG TERM GOAL #6   Title Pt will demonstrate ability to carry a 20 lb object with appropriate modulation with grip in order to prepare for return to holding and carrying child.    Time 8    Period Weeks    Status New      OT LONG TERM GOAL #7   Title Pt will complete work simulated tasks with 90% accuracy.    Time 8    Period Weeks    Status New  OT LONG TERM GOAL #8   Title Pt will verbalize understanding of adapted strategies for completing basic ADLs and IADLs.    Time 8    Period Weeks    Status New                 Plan - 01/12/20 1725    Clinical Impression Statement Pt continues to demonstrated poor awareness into deficits. Pt reports he is driving but demosntrates difficulty with dual tasking with physical/cognitive tasks.    OT Occupational Profile and History Detailed Assessment- Review of Records and additional review of physical, cognitive, psychosocial history related to current functional performance    Occupational  performance deficits (Please refer to evaluation for details): IADL's;ADL's;Work;Leisure    Body Structure / Function / Physical Skills ADL;Decreased knowledge of use of DME;Balance;Dexterity;GMC;Gait;Strength;UE functional use;Proprioception;IADL;ROM;Vestibular;Vision;Coordination;Flexibility;Mobility;Sensation;FMC    Cognitive Skills Attention;Problem Solve;Safety Awareness;Perception;Understand;Learn    Rehab Potential Good    Clinical Decision Making Limited treatment options, no task modification necessary    Comorbidities Affecting Occupational Performance: May have comorbidities impacting occupational performance    Modification or Assistance to Complete Evaluation  No modification of tasks or assist necessary to complete eval    OT Frequency 2x / week    OT Duration 8 weeks    OT Treatment/Interventions Self-care/ADL training;DME and/or AE instruction;Balance training;Therapeutic activities;Therapeutic exercise;Energy conservation;Neuromuscular education;Patient/family education;Visual/perceptual remediation/compensation;Functional Mobility Training;Cognitive remediation/compensation    Plan UE coordination, functional ambulation while carrying items    Consulted and Agree with Plan of Care Patient           Patient will benefit from skilled therapeutic intervention in order to improve the following deficits and impairments:   Body Structure / Function / Physical Skills: ADL, Decreased knowledge of use of DME, Balance, Dexterity, GMC, Gait, Strength, UE functional use, Proprioception, IADL, ROM, Vestibular, Vision, Coordination, Flexibility, Mobility, Sensation, [facility] Cognitive Skills: Attention, Problem Solve, Safety Awareness, Perception, Understand, Learn     Visit Diagnosis: Other abnormalities of gait and mobility  Visuospatial deficit  Muscle weakness (generalized)  Attention and concentration deficit  Other lack of coordination  Frontal lobe and executive function  deficit    Problem List Patient Active Problem List   Diagnosis Date Noted   Anterior dislocation of right shoulder 03/01/2016    Junious Dresser MOT, OTR/L  01/12/2020, 5:38 PM  [facility] [address] [place], [place], [address] Phone: [phone]   Fax:  [phone]  Name: Samuel Stanley MRN: [id number] Date of Birth: May 12, 1990

## 2020-01-13 ENCOUNTER — Ambulatory Visit: Payer: Commercial Managed Care - PPO

## 2020-01-14 ENCOUNTER — Ambulatory Visit: Payer: Commercial Managed Care - PPO | Admitting: Physical Therapy

## 2020-01-14 ENCOUNTER — Encounter: Payer: Self-pay | Admitting: Physical Therapy

## 2020-01-14 ENCOUNTER — Other Ambulatory Visit: Payer: Self-pay

## 2020-01-14 DIAGNOSIS — R2689 Other abnormalities of gait and mobility: Secondary | ICD-10-CM | POA: Diagnosis not present

## 2020-01-14 DIAGNOSIS — M6281 Muscle weakness (generalized): Secondary | ICD-10-CM

## 2020-01-14 DIAGNOSIS — R2681 Unsteadiness on feet: Secondary | ICD-10-CM

## 2020-01-14 DIAGNOSIS — R278 Other lack of coordination: Secondary | ICD-10-CM

## 2020-01-14 NOTE — Therapy (Signed)
[facility] [address] [place], [place], [address] Phone: [phone]   Fax:  [phone]  Physical Therapy Treatment  Patient Details  Name: Samuel Stanley MRN: [id number] Date of Birth: Oct 14, 1990 Referring Provider (PT): Francine Graven, DO   Encounter Date: 01/14/2020   PT End of Session - 01/14/20 1833    Visit Number 16    Number of Visits 19    Date for PT Re-Evaluation 03/01/20   POC for 4 weeks, Cert for 60 days   Authorization Type [employer] (VL: 20)    PT Start Time 1530    PT Stop Time 1630    PT Time Calculation (min) 60 min    Equipment Utilized During Treatment Other (comment)   bar bells   Activity Tolerance Patient tolerated treatment well    Behavior During Therapy [facility] for tasks assessed/performed           Past Medical History:  Diagnosis Date   Hypertension     Past Surgical History:  Procedure Laterality Date   HERNIA REPAIR     testicular torsion      There were no vitals filed for this visit.   Subjective Assessment - 01/14/20 1831    Subjective Pt presents to [facility] for aquatic therapy - states he and his wife went to the [employer] last weekend to do pool exercises    Patient is accompained by: Family member   Wife - [name]   Pertinent History Hypertension    Limitations Walking;Standing;House hold activities    Patient Stated Goals Being Samuel to walk and stand    Currently in Pain? No/denies                  Aquatic therapy at [facility] - pool temp. 86.6 degrees  Patient seen for aquatic therapy today.  Treatment took place in water 3.5-4 feet deep depending upon activity.  Pt entered and exited the pool via step negotiation with use of bil. Hand rails with supervision.  Pt performed gait training in 4' water depth 25m x 4 reps - cues for reciprocal arm swing with leg movement; bar bells used on 4th rep with cues to alternate pushing and pulling bar bells for reciprocal UE  movement  Backwards ambulation approx. 34m across pool length  Pt performed marching in place 10 reps each leg without UE support; progressed to marching forward/backward across pool, approx. 37m   Pt performed crossovers front - each leg - approx. 51m x 2 reps across pool - no UE support used - pt Samuel to independently regain balance  Pt performed side stepping with small squats - with UE movement incorporated- arms out to side with stepping out to side & squat - then UE's adducted with stepping together  - 15m x 1 rep across pool  Pt performed standing hip flexion, abdct., and extension 10 reps each leg - did not alternate legs; cues to keep knee extended with hip movement but pt unable to maintain knee extension bil. LE's  Performed 1/2 jumping jacks 10 reps; cross country skiing with reciprocal arm movement attempted (difficult to perform with reciprocal movement)  Pt performed jogging 59m across pool x 2 reps  Ai Chi postures - soothing, freeing and balancing - 10 reps each posture - pt Samuel to perform without UE support - Samuel to recover LOB independently    Pt requires buoyancy of the water for support with dynamic balance exercises and with plyometrics that can not  be performed safely on land without fall risk Water current needed for perturbations for challenges with static & dynamic standing balance; viscosity of water needed for strengthening of LE's as well as for trunk musc. Strengthening to fascilitate trunk/core stabilization                                PT Short Term Goals - 01/01/20 1919      PT SHORT TERM GOAL #1   Title = LTGs             PT Long Term Goals - 01/14/20 1857      PT LONG TERM GOAL #1   Title Patient will be independent with final HEP for balance/strengthening (ALL LTGs Due: 01/29/20)    Baseline continue to progress HEP    Time 4    Period Weeks    Status Revised      PT LONG TERM GOAL #2   Title Pt will  increase Berg score >/= 46/56 to demonstrate improved balance and decreased fall risk.    Baseline 11/12/19 38/56, 44/56    Time 4    Period Weeks    Status Revised      PT LONG TERM GOAL #3   Title Patient will demo ability to ascend/descend 12 stairs with supervision to allow for improved functional mobility    Baseline single rail, 12 stairs, alteranting pattern, intermittent CGA/supervision    Time 4    Period Weeks    Status Revised      PT LONG TERM GOAL #4   Title Patient will improve 5x sit<> stand to </= 15 seconds with UE to demo improved functional mobility    Baseline 23.84 secs, 18.1 secs w/ UE support    Time 4    Period Weeks    Status On-going                 Plan - 01/14/20 1834    Clinical Impression Statement Aquatic therapy session focused on balance and gait training and trunk/core stabilization activities.  Pt continues to have difficulty performing reciprocal arm and leg movements due to decreased coordination.  Pt also has difficulty performing standing hip exercises with knee extended (on each leg) - difficulty maintaining knee extension on each leg.  Pt Samuel to jog with mild dyscoordination but without LOB in 4' water depth.  Cont with POC.    Personal Factors and Comorbidities Comorbidity 1;Time since onset of injury/illness/exacerbation    Comorbidities HTN    Examination-Activity Limitations Bend;Carry;Caring for Others;Stairs;Stand;Locomotion Level;Squat;Transfers    Examination-Participation Restrictions Occupation;Community Activity;Yard Work    Conservation officer, historic buildings Evolving/Moderate complexity    Rehab Potential Fair    PT Frequency 2x / week    PT Duration 3 weeks   followed by 1x/week for 1 week   PT Treatment/Interventions ADLs/Self Care Home Management;Aquatic Therapy;Electrical Stimulation;Moist Heat;Contrast Bath;DME Instruction;Gait training;Functional mobility training;Neuromuscular re-education;Stair training;Therapeutic  activities;Patient/family education;Therapeutic exercise;Balance training;Orthotic Fit/Training;Manual techniques;Passive range of motion    PT Next Visit Plan Continue to work on gait, balance and coordination activities. Dynamic Seated balance activites (on green air disc). Add in more core activities with balance.    Consulted and Agree with Plan of Care Patient           Patient will benefit from skilled therapeutic intervention in order to improve the following deficits and impairments:  Abnormal gait, Decreased coordination, Difficulty walking, Decreased safety awareness, Decreased  activity tolerance, Impaired sensation, Postural dysfunction, Decreased strength, Decreased mobility, Decreased balance, Decreased knowledge of use of DME, Decreased endurance  Visit Diagnosis: Other abnormalities of gait and mobility  Muscle weakness (generalized)  Other lack of coordination  Unsteadiness on feet     Problem List Patient Active Problem List   Diagnosis Date Noted   Anterior dislocation of right shoulder 03/01/2016    Akaila Rambo, Donavan Burnet  PT, ATRIC 01/14/2020, 7:00 PM  [facility] [address] [place], [place], [address] Phone: [phone]   Fax:  [phone]  Name: Samuel Stanley MRN: [id number] Date of Birth: [birth year]

## 2020-01-15 ENCOUNTER — Encounter: Payer: Self-pay | Admitting: Occupational Therapy

## 2020-01-15 ENCOUNTER — Ambulatory Visit: Payer: Commercial Managed Care - PPO | Admitting: Occupational Therapy

## 2020-01-15 ENCOUNTER — Ambulatory Visit: Payer: Commercial Managed Care - PPO | Admitting: Physical Therapy

## 2020-01-15 DIAGNOSIS — R278 Other lack of coordination: Secondary | ICD-10-CM

## 2020-01-15 DIAGNOSIS — R41840 Attention and concentration deficit: Secondary | ICD-10-CM

## 2020-01-15 DIAGNOSIS — R260 Ataxic gait: Secondary | ICD-10-CM

## 2020-01-15 DIAGNOSIS — R2689 Other abnormalities of gait and mobility: Secondary | ICD-10-CM

## 2020-01-15 DIAGNOSIS — R2681 Unsteadiness on feet: Secondary | ICD-10-CM

## 2020-01-15 DIAGNOSIS — M6281 Muscle weakness (generalized): Secondary | ICD-10-CM

## 2020-01-15 DIAGNOSIS — R41844 Frontal lobe and executive function deficit: Secondary | ICD-10-CM

## 2020-01-15 DIAGNOSIS — R41842 Visuospatial deficit: Secondary | ICD-10-CM

## 2020-01-15 DIAGNOSIS — R270 Ataxia, unspecified: Secondary | ICD-10-CM

## 2020-01-15 NOTE — Therapy (Signed)
[facility] [address] [place], [place], [address] Phone: [phone]   Fax:  [phone]  Physical Therapy Treatment  Patient Details  Name: Samuel Stanley MRN: [id number] Date of Birth: 1990-09-16 Referring Provider (PT): Francine Graven, DO   Encounter Date: 01/15/2020   PT End of Session - 01/15/20 1848    Visit Number 17    Number of Visits 19    Date for PT Re-Evaluation 03/01/20   POC for 4 weeks, Cert for 60 days   Authorization Type [employer] (VL: 20)    PT Start Time 1745    PT Stop Time 1830    PT Time Calculation (min) 45 min    Equipment Utilized During Treatment Other (comment)   bar bells   Activity Tolerance Patient tolerated treatment well    Behavior During Therapy [facility] for tasks assessed/performed           Past Medical History:  Diagnosis Date  . Hypertension     Past Surgical History:  Procedure Laterality Date  . HERNIA REPAIR    . testicular torsion      There were no vitals filed for this visit.   Subjective Assessment - 01/15/20 1845    Subjective Pt reports no new complaints or issues.    Patient is accompained by: Family member    Pertinent History Hypertension    Limitations Walking;Standing;House hold activities    Patient Stated Goals Being able to walk and stand                                  Balance Exercises - 01/15/20 0001      Balance Exercises: Standing   SLS with Vectors Solid surface   cues to target variable step heights x10 bilat   Tandem Gait Forward;Upper extremity support;3 reps;Limitations    Tandem Gait Limitations completed in // bars with UE support x 3 laps on blue mat, down and back.     Other Standing Exercises On pball:alternating UE raises x10 bilat; on bosu ball: alternating LAQ x10 bilat; alternating marching x 10    Other Standing Exercises Comments in // bars: palloff press with feet together x10 bilat with red  tband; palloff press in partial tandem x10 bilat with red tband; side stepping 3 laps each way; walking on blue mat across // bars with foot targets 2 laps each way   pt challenged in tandem stance              PT Short Term Goals - 01/01/20 1919      PT SHORT TERM GOAL #1   Title = LTGs             PT Long Term Goals - 01/14/20 1857      PT LONG TERM GOAL #1   Title Patient will be independent with final HEP for balance/strengthening (ALL LTGs Due: 01/29/20)    Baseline continue to progress HEP    Time 4    Period Weeks    Status Revised      PT LONG TERM GOAL #2   Title Pt will increase Berg score >/= 46/56 to demonstrate improved balance and decreased fall risk.    Baseline 11/12/19 38/56, 44/56    Time 4    Period Weeks    Status Revised      PT LONG TERM GOAL #3   Title Patient will demo  ability to ascend/descend 12 stairs with supervision to allow for improved functional mobility    Baseline single rail, 12 stairs, alteranting pattern, intermittent CGA/supervision    Time 4    Period Weeks    Status Revised      PT LONG TERM GOAL #4   Title Patient will improve 5x sit<> stand to </= 15 seconds with UE to demo improved functional mobility    Baseline 23.84 secs, 18.1 secs w/ UE support    Time 4    Period Weeks    Status On-going                 Plan - 01/15/20 1846    Clinical Impression Statement Continued to focus on trunk stabilization in sitting and standing. Continued to challenge pt's coordination with stepping on targets. Pt continues to be challenged with tandem stance and balancing on compliant surfaces.    Personal Factors and Comorbidities Comorbidity 1;Time since onset of injury/illness/exacerbation    Comorbidities HTN    Examination-Activity Limitations Bend;Carry;Caring for Others;Stairs;Stand;Locomotion Level;Squat;Transfers    Examination-Participation Restrictions Occupation;Community Activity;Yard Work    Midwife Evolving/Moderate complexity    Rehab Potential Fair    PT Frequency 2x / week    PT Duration 3 weeks   followed by 1x/week for 1 week   PT Treatment/Interventions ADLs/Self Care Home Management;Aquatic Therapy;Electrical Stimulation;Moist Heat;Contrast Bath;DME Instruction;Gait training;Functional mobility training;Neuromuscular re-education;Stair training;Therapeutic activities;Patient/family education;Therapeutic exercise;Balance training;Orthotic Fit/Training;Manual techniques;Passive range of motion    PT Next Visit Plan Continue to work on gait, balance and coordination activities. Dynamic Seated balance activites (on green air disc). Add in more core activities with balance.    Consulted and Agree with Plan of Care Patient           Patient will benefit from skilled therapeutic intervention in order to improve the following deficits and impairments:  Abnormal gait, Decreased coordination, Difficulty walking, Decreased safety awareness, Decreased activity tolerance, Impaired sensation, Postural dysfunction, Decreased strength, Decreased mobility, Decreased balance, Decreased knowledge of use of DME, Decreased endurance  Visit Diagnosis: Other abnormalities of gait and mobility  Muscle weakness (generalized)  Other lack of coordination  Unsteadiness on feet  Ataxia  Ataxic gait     Problem List Patient Active Problem List   Diagnosis Date Noted  . Anterior dislocation of right shoulder 03/01/2016    [facility] [address] [place] PT, DPT 01/15/2020, 6:49 PM  [facility] [address] [place], [place], [address] Phone: [phone]   Fax:  [phone]  Name: Samuel Stanley MRN: [id number] Date of Birth: 24-Jul-1990

## 2020-01-15 NOTE — Therapy (Signed)
[facility] [address] [place], [place], [address] Phone: [phone]   Fax:  [phone]  Occupational Therapy Treatment  Patient Details  Name: Samuel Stanley MRN: [id number] Date of Birth: 08/07/1990 Referring Provider (OT): Dr. Francine Graven   Encounter Date: 01/15/2020   OT End of Session - 01/15/20 1704    Visit Number 4    Number of Visits 17    Date for OT Re-Evaluation 02/27/20    Authorization Type [employer] $35 copay    Authorization Time Period VL: 20 OT    OT Start Time 1704    OT Stop Time 1744    OT Time Calculation (min) 40 min    Activity Tolerance Patient tolerated treatment well    Behavior During Therapy [facility] for tasks assessed/performed           Past Medical History:  Diagnosis Date  . Hypertension     Past Surgical History:  Procedure Laterality Date  . HERNIA REPAIR    . testicular torsion      There were no vitals filed for this visit.   Subjective Assessment - 01/15/20 1705    Subjective  Pt denies any pain. Says there's nothing new right now. Pt reports "all this is helping. usually i can't pull my son's jacket off but i unzipped it and pulled it off"    Pertinent History Hypertension    Limitations Fall.    Patient Stated Goals "I didn't even know I had it" - After assessment pt said he wants to "get faster". "hold my son and stand up"    Currently in Pain? No/denies              Treatment: Resistance Clothespins 1-8# with BUE for placing on antenna for increase in proprioception and resistance for coordination. Pt complete once with RUE and than with LUE. Pt required increased time.  Grooved Pegboard with verbal cues for unilateral rotation of pegs. Pt alternated between hands but encouraged to rotate pegs unilaterally instead of using two hands.  UEB 5 minutes - level 3 BUE              OT Short Term Goals - 01/15/20 1719      OT SHORT TERM GOAL #1   Title  Pt will be independent with HEP 01/29/20    Time 4    Period Weeks    Status On-going    Target Date 01/29/20      OT SHORT TERM GOAL #2   Title Pt will perform physical and cognitive task simultaneously with 90% accuracy    Time 4    Period Weeks    Status New      OT SHORT TERM GOAL #3   Title Pt will perform a meal consisting of at least 3 steps with supervision, adapted strategies and good safety awareness.    Time 4    Period Weeks    Status New      OT SHORT TERM GOAL #4   Title Pt will improve 9 hole peg test score by 5 seconds bilaterally in order to increase fine motor coordination in BUE.    Baseline RUE 87.16 seconds; LUE 70.38 seconds    Time 4    Period Weeks    Status New      OT SHORT TERM GOAL #5   Title Pt will complete modreately complex visual perceptual task with min assistance..    Time 4    Period  Weeks    Status Revised      OT SHORT TERM GOAL #6   Title Pt will navigate environment while carrying a plate of food or cup of liquid in either UE with minimal spills or drops in order to increase coordination.    Time 4    Period Weeks    Status New      OT SHORT TERM GOAL #7   Title Pt will demonstrate ability to hold and carry a styrofoam cup with mod I for modulation of load force with BUE.    Time 4    Period Weeks    Status New             OT Long Term Goals - 01/01/20 1850      OT LONG TERM GOAL #1   Title Pt will be independent with updated HEP 02/26/20    Time 8    Period Weeks    Status New    Target Date 02/26/20      OT LONG TERM GOAL #2   Title Pt will write 3-5 sentences with 100% legibility in appropriate time in order to increase speed for handwritten tasks.    Time 8    Period Weeks    Status New      OT LONG TERM GOAL #3   Title Pt will perform a meal consisting of at least 3 steps with mod I, adapted strategies and good safety awareness.    Time 8    Period Weeks    Status New      OT LONG TERM GOAL #4   Title  Pt will improve 9 hole peg test score by 10 seconds bilaterally in order to increase fine motor coordination in BUE.    Baseline RUE 87.16 seocnds, LUE 70.38 seconds    Time 8    Period Weeks    Status New      OT LONG TERM GOAL #5   Title Pt will navigate environment while carrying a plate of food or cup of liquid in either UE with no spills or drops in order to increase coordination.    Time 8    Period Weeks    Status New      OT LONG TERM GOAL #6   Title Pt will demonstrate ability to carry a 20 lb object with appropriate modulation with grip in order to prepare for return to holding and carrying child.    Time 8    Period Weeks    Status New      OT LONG TERM GOAL #7   Title Pt will complete work simulated tasks with 90% accuracy.    Time 8    Period Weeks    Status New      OT LONG TERM GOAL #8   Title Pt will verbalize understanding of adapted strategies for completing basic ADLs and IADLs.    Time 8    Period Weeks    Status New                 Plan - 01/15/20 1709    Clinical Impression Statement Pt continues to demonstrated poor awareness into deficits. Pt reports he is still driving but demosntrates difficulty with dual tasking with physical/cognitive tasks. Pt is progressing towards goals.    OT Occupational Profile and History Detailed Assessment- Review of Records and additional review of physical, cognitive, psychosocial history related to current functional performance    Occupational performance deficits (Please  refer to evaluation for details): IADL's;ADL's;Work;Leisure    Body Structure / Function / Physical Skills ADL;Decreased knowledge of use of DME;Balance;Dexterity;GMC;Gait;Strength;UE functional use;Proprioception;IADL;ROM;Vestibular;Vision;Coordination;Flexibility;Mobility;Sensation;FMC    Cognitive Skills Attention;Problem Solve;Safety Awareness;Perception;Understand;Learn    Rehab Potential Good    Clinical Decision Making Limited treatment  options, no task modification necessary    Comorbidities Affecting Occupational Performance: May have comorbidities impacting occupational performance    Modification or Assistance to Complete Evaluation  No modification of tasks or assist necessary to complete eval    OT Frequency 2x / week    OT Duration 8 weeks    OT Treatment/Interventions Self-care/ADL training;DME and/or AE instruction;Balance training;Therapeutic activities;Therapeutic exercise;Energy conservation;Neuromuscular education;Patient/family education;Visual/perceptual remediation/compensation;Functional Mobility Training;Cognitive remediation/compensation    Plan UE coordination, functional ambulation while carrying items    Consulted and Agree with Plan of Care Patient           Patient will benefit from skilled therapeutic intervention in order to improve the following deficits and impairments:   Body Structure / Function / Physical Skills: ADL, Decreased knowledge of use of DME, Balance, Dexterity, GMC, Gait, Strength, UE functional use, Proprioception, IADL, ROM, Vestibular, Vision, Coordination, Flexibility, Mobility, Sensation, [facility] Cognitive Skills: Attention, Problem Solve, Safety Awareness, Perception, Understand, Learn     Visit Diagnosis: Frontal lobe and executive function deficit  Visuospatial deficit  Attention and concentration deficit  Other abnormalities of gait and mobility  Muscle weakness (generalized)  Other lack of coordination  Unsteadiness on feet    Problem List Patient Active Problem List   Diagnosis Date Noted  . Anterior dislocation of right shoulder 03/01/2016    Junious Dresser MOT, OTR/L  01/15/2020, 6:34 PM  [place] [facility] [address] [place], [place], [address] Phone: [phone]   Fax:  [phone]  Name: Samuel Stanley MRN: [id number] Date of Birth: 09-21-1990

## 2020-01-19 ENCOUNTER — Encounter: Payer: Self-pay | Admitting: Occupational Therapy

## 2020-01-19 ENCOUNTER — Other Ambulatory Visit: Payer: Self-pay

## 2020-01-19 ENCOUNTER — Ambulatory Visit: Payer: Commercial Managed Care - PPO | Admitting: Occupational Therapy

## 2020-01-19 DIAGNOSIS — R2689 Other abnormalities of gait and mobility: Secondary | ICD-10-CM

## 2020-01-19 DIAGNOSIS — M6281 Muscle weakness (generalized): Secondary | ICD-10-CM

## 2020-01-19 DIAGNOSIS — R41840 Attention and concentration deficit: Secondary | ICD-10-CM

## 2020-01-19 DIAGNOSIS — R278 Other lack of coordination: Secondary | ICD-10-CM

## 2020-01-19 DIAGNOSIS — R41844 Frontal lobe and executive function deficit: Secondary | ICD-10-CM

## 2020-01-19 DIAGNOSIS — R41842 Visuospatial deficit: Secondary | ICD-10-CM

## 2020-01-19 DIAGNOSIS — R2681 Unsteadiness on feet: Secondary | ICD-10-CM

## 2020-01-19 NOTE — Therapy (Signed)
[facility] [address] [place], [place], [address] Phone: [phone]   Fax:  [phone]  Occupational Therapy Treatment  Patient Details  Name: Samuel Stanley MRN: [id number] Date of Birth: August 25, 1990 Referring Provider (OT): Dr. Francine Graven   Encounter Date: 01/19/2020   OT End of Session - 01/19/20 1703    Visit Number 5    Number of Visits 17    Date for OT Re-Evaluation 02/27/20    Authorization Type [employer] $35 copay    Authorization Time Period VL: 20 OT    OT Start Time 1702    OT Stop Time 1745    OT Time Calculation (min) 43 min    Activity Tolerance Patient tolerated treatment well    Behavior During Therapy [facility] for tasks assessed/performed           Past Medical History:  Diagnosis Date  . Hypertension     Past Surgical History:  Procedure Laterality Date  . HERNIA REPAIR    . testicular torsion      There were no vitals filed for this visit.   Subjective Assessment - 01/19/20 1703    Subjective  Pt said "I was about asleep out there". Pt denies any pain and reports he "did nothing" over the weekend.    Pertinent History Hypertension    Limitations Fall.    Patient Stated Goals "I didn't even know I had it" - After assessment pt said he wants to "get faster". "hold my son and stand up"    Currently in Pain? No/denies            Treatment:  Simple warm meal prep - scrambled an egg with increased time and supervision for verbal cues for sequencing and safety and finding items. Pt had difficulty with initiation and coordination with cracking egg. Pt also had some difficulty with multi tasking with cooking and taking eggs off burner while cleaning up. Pt remembered to turn stove off at the end.   12 pc puzzle - pt required max verbal and visual cues for completing 12 piece puzzle with difficulty with attention to detail, problem solving and putting parts to whole.  Constant Therapy -  Alternating symbols, level 3, with difficulty with novel task but once caught on did well with minimal assistance. Some deficits with carryover of in app procedures. Same shapes with min difficulty but increased time.                    OT Short Term Goals - 01/19/20 1718      OT SHORT TERM GOAL #1   Title Pt will be independent with HEP 01/29/20    Time 4    Period Weeks    Status On-going    Target Date 01/29/20      OT SHORT TERM GOAL #2   Title Pt will perform physical and cognitive task simultaneously with 90% accuracy    Time 4    Period Weeks    Status On-going      OT SHORT TERM GOAL #3   Title Pt will perform a meal consisting of at least 3 steps with supervision, adapted strategies and good safety awareness.    Time 4    Period Weeks    Status On-going   pt made eggs with increased time and supervision for sequencing and finding items     OT SHORT TERM GOAL #4   Title Pt will improve 9 hole peg test  score by 5 seconds bilaterally in order to increase fine motor coordination in BUE.    Baseline RUE 87.16 seconds; LUE 70.38 seconds    Time 4    Period Weeks    Status New      OT SHORT TERM GOAL #5   Title Pt will complete modreately complex visual perceptual task with min assistance..    Time 4    Period Weeks    Status On-going   max A for 12 pc puzzle     OT SHORT TERM GOAL #6   Title Pt will navigate environment while carrying a plate of food or cup of liquid in either UE with minimal spills or drops in order to increase coordination.    Time 4    Period Weeks    Status On-going      OT SHORT TERM GOAL #7   Title Pt will demonstrate ability to hold and carry a styrofoam cup with mod I for modulation of load force with BUE.    Time 4    Period Weeks    Status New             OT Long Term Goals - 01/01/20 1850      OT LONG TERM GOAL #1   Title Pt will be independent with updated HEP 02/26/20    Time 8    Period Weeks    Status New      Target Date 02/26/20      OT LONG TERM GOAL #2   Title Pt will write 3-5 sentences with 100% legibility in appropriate time in order to increase speed for handwritten tasks.    Time 8    Period Weeks    Status New      OT LONG TERM GOAL #3   Title Pt will perform a meal consisting of at least 3 steps with mod I, adapted strategies and good safety awareness.    Time 8    Period Weeks    Status New      OT LONG TERM GOAL #4   Title Pt will improve 9 hole peg test score by 10 seconds bilaterally in order to increase fine motor coordination in BUE.    Baseline RUE 87.16 seocnds, LUE 70.38 seconds    Time 8    Period Weeks    Status New      OT LONG TERM GOAL #5   Title Pt will navigate environment while carrying a plate of food or cup of liquid in either UE with no spills or drops in order to increase coordination.    Time 8    Period Weeks    Status New      OT LONG TERM GOAL #6   Title Pt will demonstrate ability to carry a 20 lb object with appropriate modulation with grip in order to prepare for return to holding and carrying child.    Time 8    Period Weeks    Status New      OT LONG TERM GOAL #7   Title Pt will complete work simulated tasks with 90% accuracy.    Time 8    Period Weeks    Status New      OT LONG TERM GOAL #8   Title Pt will verbalize understanding of adapted strategies for completing basic ADLs and IADLs.    Time 8    Period Weeks    Status New  Plan - 01/19/20 1703    Clinical Impression Statement Pt    OT Occupational Profile and History Detailed Assessment- Review of Records and additional review of physical, cognitive, psychosocial history related to current functional performance    Occupational performance deficits (Please refer to evaluation for details): IADL's;ADL's;Work;Leisure    Body Structure / Function / Physical Skills ADL;Decreased knowledge of use of DME;Balance;Dexterity;GMC;Gait;Strength;UE functional  use;Proprioception;IADL;ROM;Vestibular;Vision;Coordination;Flexibility;Mobility;Sensation;FMC    Cognitive Skills Attention;Problem Solve;Safety Awareness;Perception;Understand;Learn    Rehab Potential Good    Clinical Decision Making Limited treatment options, no task modification necessary    Comorbidities Affecting Occupational Performance: May have comorbidities impacting occupational performance    Modification or Assistance to Complete Evaluation  No modification of tasks or assist necessary to complete eval    OT Frequency 2x / week    OT Duration 8 weeks    OT Treatment/Interventions Self-care/ADL training;DME and/or AE instruction;Balance training;Therapeutic activities;Therapeutic exercise;Energy conservation;Neuromuscular education;Patient/family education;Visual/perceptual remediation/compensation;Functional Mobility Training;Cognitive remediation/compensation    Plan UE coordination, functional ambulation while carrying items    Consulted and Agree with Plan of Care Patient           Patient will benefit from skilled therapeutic intervention in order to improve the following deficits and impairments:   Body Structure / Function / Physical Skills: ADL, Decreased knowledge of use of DME, Balance, Dexterity, GMC, Gait, Strength, UE functional use, Proprioception, IADL, ROM, Vestibular, Vision, Coordination, Flexibility, Mobility, Sensation, [facility] Cognitive Skills: Attention, Problem Solve, Safety Awareness, Perception, Understand, Learn     Visit Diagnosis: Frontal lobe and executive function deficit  Visuospatial deficit  Attention and concentration deficit  Other abnormalities of gait and mobility  Other lack of coordination  Muscle weakness (generalized)  Unsteadiness on feet    Problem List Patient Active Problem List   Diagnosis Date Noted  . Anterior dislocation of right shoulder 03/01/2016    Junious Dresser MOT, OTR/L  01/19/2020, 6:00 PM  [facility] [address] [place], [place], [address] Phone: [phone]   Fax:  [phone]  Name: Samuel Stanley MRN: [id number] Date of Birth: [birth year]

## 2020-01-21 ENCOUNTER — Other Ambulatory Visit: Payer: Self-pay

## 2020-01-21 ENCOUNTER — Ambulatory Visit: Payer: Commercial Managed Care - PPO | Admitting: Physical Therapy

## 2020-01-21 DIAGNOSIS — R278 Other lack of coordination: Secondary | ICD-10-CM

## 2020-01-21 DIAGNOSIS — R2689 Other abnormalities of gait and mobility: Secondary | ICD-10-CM | POA: Diagnosis not present

## 2020-01-21 DIAGNOSIS — R2681 Unsteadiness on feet: Secondary | ICD-10-CM

## 2020-01-21 NOTE — Therapy (Signed)
[facility] [address] [place], [place], [address] Phone: [phone]   Fax:  [phone]  Physical Therapy Treatment  Patient Details  Name: Marguis Mathieson MRN: [id number] Date of Birth: 1990-10-17 Referring Provider (PT): Francine Graven, DO   Encounter Date: 01/21/2020   PT End of Session - 01/21/20 2040    Visit Number 18    Number of Visits 19    Date for PT Re-Evaluation 03/01/20   POC for 4 weeks, Cert for 60 days   Authorization Type [employer] (VL: 20)    PT Start Time 1530    PT Stop Time 1630    PT Time Calculation (min) 60 min    Equipment Utilized During Treatment Other (comment)   bar bells   Activity Tolerance Patient tolerated treatment well    Behavior During Therapy [facility] for tasks assessed/performed           Past Medical History:  Diagnosis Date  . Hypertension     Past Surgical History:  Procedure Laterality Date  . HERNIA REPAIR    . testicular torsion      There were no vitals filed for this visit.   Subjective Assessment - 01/21/20 2038    Subjective Pt presents for aquatic therapy at [facility] - states he and his wife went to [facility] for pool exercise last weekend    Patient is accompained by: Family member    Pertinent History Hypertension    Limitations Walking;Standing;House hold activities    Patient Stated Goals Being able to walk and stand    Currently in Pain? No/denies                 Aquatic therapy at [facility] - pool temp. 84.4 degrees  Patient seen for aquatic therapy today.  Treatment took place in water 3.5-4 feet deep depending upon activity.  Pt entered and exited the pool via step negotiation with use of bil. Hand rails with supervision.  Pt performed gait training in 4' water depth m 25x 2 reps - cues for reciprocal arm swing with leg movement; cues to increase step length   Backwards ambulation approx. 58m across pool length - no LOB occurred   Pt  performed marching in place 10 reps each leg without UE support; progressed to marching forward/backward across pool, approx. 35m   Pt performed crossovers front and then stepping behind - each leg - approx. 72m x 1 rep across pool - no UE support used  Squats x 10 reps without UE support; attempted unilateral squats to fascilitate improved SLS on each leg but difficult for pt to perform  Pt performed side stepping with small squats - with UE movement incorporated- arms out to side with stepping out to side & squat - then UE's adducted with stepping together  - 65m x 1 rep across pool  Pt performed standing hip flexion, abdct., and extension 10 reps each leg - alternating legs with each movement; attempted hip flexion/extension without stopping in middle for improved SLS on each leg - 10 reps each leg  Performed 1/2 jumping jacks 10 reps; cross country skiing with reciprocal arm movement  - 10 reps   Pt performed jogging 40m across pool x 2 reps  Ai Chi postures - soothing, freeing and balancing - 10 reps each posture - pt able to perform without UE support - able to recover LOB independently    Pt requires buoyancy of the water for support with dynamic balance exercises  and with plyometrics that can not be performed safely on land without fall risk Water current needed for perturbations for challenges with static & dynamic standing balance; viscosity of water needed for strengthening of LE's as well as for trunk musc. Strengthening to fascilitate trunk/core stabilization                               PT Short Term Goals - 01/01/20 1919      PT SHORT TERM GOAL #1   Title = LTGs             PT Long Term Goals - 01/21/20 2050      PT LONG TERM GOAL #1   Title Patient will be independent with final HEP for balance/strengthening (ALL LTGs Due: 01/29/20)    Baseline continue to progress HEP    Time 4    Period Weeks    Status Revised      PT LONG TERM GOAL  #2   Title Pt will increase Berg score >/= 46/56 to demonstrate improved balance and decreased fall risk.    Baseline 11/12/19 38/56, 44/56    Time 4    Period Weeks    Status Revised      PT LONG TERM GOAL #3   Title Patient will demo ability to ascend/descend 12 stairs with supervision to allow for improved functional mobility    Baseline single rail, 12 stairs, alteranting pattern, intermittent CGA/supervision    Time 4    Period Weeks    Status Revised      PT LONG TERM GOAL #4   Title Patient will improve 5x sit<> stand to </= 15 seconds with UE to demo improved functional mobility    Baseline 23.84 secs, 18.1 secs w/ UE support    Time 4    Period Weeks    Status On-going                 Plan - 01/21/20 2041    Clinical Impression Statement Pt improving with standing balance exercises in water and also with coordination exercises, however, pt must concentrate and focus to achieve correct sequence (reciprocal UE/LE movement).  Pt continues to have difficulty keeping each knee extended with hip flexion and extension.  Improved trunk and core stabilization noted with Ai Chi postures as pt is able to perform these movements without LOB.    Personal Factors and Comorbidities Comorbidity 1;Time since onset of injury/illness/exacerbation    Comorbidities HTN    Examination-Activity Limitations Bend;Carry;Caring for Others;Stairs;Stand;Locomotion Level;Squat;Transfers    Examination-Participation Restrictions Occupation;Community Activity;Yard Work    Conservation officer, historic buildings Evolving/Moderate complexity    Rehab Potential Fair    PT Frequency 2x / week    PT Duration 3 weeks   followed by 1x/week for 1 week   PT Treatment/Interventions ADLs/Self Care Home Management;Aquatic Therapy;Electrical Stimulation;Moist Heat;Contrast Bath;DME Instruction;Gait training;Functional mobility training;Neuromuscular re-education;Stair training;Therapeutic activities;Patient/family  education;Therapeutic exercise;Balance training;Orthotic Fit/Training;Manual techniques;Passive range of motion    PT Next Visit Plan Continue to work on gait, balance and coordination activities. Dynamic Seated balance activites (on green air disc). Add in more core activities with balance.    Consulted and Agree with Plan of Care Patient           Patient will benefit from skilled therapeutic intervention in order to improve the following deficits and impairments:  Abnormal gait, Decreased coordination, Difficulty walking, Decreased safety awareness, Decreased activity tolerance, Impaired sensation,  Postural dysfunction, Decreased strength, Decreased mobility, Decreased balance, Decreased knowledge of use of DME, Decreased endurance  Visit Diagnosis: Other abnormalities of gait and mobility  Other lack of coordination  Unsteadiness on feet     Problem List Patient Active Problem List   Diagnosis Date Noted  . Anterior dislocation of right shoulder 03/01/2016    Josely Moffat, Donavan Burnet, PT, ATRIC 01/21/2020, 8:51 PM  [facility] [address] [place], [place], [address] Phone: [phone]   Fax:  [phone]  Name: Sevon Rotert MRN: [id number] Date of Birth: 06-Dec-1990

## 2020-01-22 ENCOUNTER — Ambulatory Visit: Payer: Commercial Managed Care - PPO

## 2020-01-22 ENCOUNTER — Ambulatory Visit: Payer: Commercial Managed Care - PPO | Admitting: Occupational Therapy

## 2020-01-27 ENCOUNTER — Other Ambulatory Visit: Payer: Self-pay

## 2020-01-27 ENCOUNTER — Ambulatory Visit: Payer: Commercial Managed Care - PPO

## 2020-01-27 ENCOUNTER — Encounter: Payer: Self-pay | Admitting: Occupational Therapy

## 2020-01-27 ENCOUNTER — Ambulatory Visit: Payer: Commercial Managed Care - PPO | Admitting: Occupational Therapy

## 2020-01-27 DIAGNOSIS — R41840 Attention and concentration deficit: Secondary | ICD-10-CM

## 2020-01-27 DIAGNOSIS — R2689 Other abnormalities of gait and mobility: Secondary | ICD-10-CM

## 2020-01-27 DIAGNOSIS — R41844 Frontal lobe and executive function deficit: Secondary | ICD-10-CM

## 2020-01-27 DIAGNOSIS — R260 Ataxic gait: Secondary | ICD-10-CM

## 2020-01-27 DIAGNOSIS — R41842 Visuospatial deficit: Secondary | ICD-10-CM

## 2020-01-27 DIAGNOSIS — M6281 Muscle weakness (generalized): Secondary | ICD-10-CM

## 2020-01-27 DIAGNOSIS — R2681 Unsteadiness on feet: Secondary | ICD-10-CM

## 2020-01-27 DIAGNOSIS — R278 Other lack of coordination: Secondary | ICD-10-CM

## 2020-01-27 NOTE — Therapy (Signed)
[facility] [address] [place], [place], [address] Phone: [phone]   Fax:  [phone]  Occupational Therapy Treatment  Patient Details  Name: Samuel Stanley MRN: [id number] Date of Birth: 1990-07-28 Referring Provider (OT): Dr. Francine Graven   Encounter Date: 01/27/2020   OT End of Session - 01/27/20 1646    Visit Number 6    Number of Visits 17    Date for OT Re-Evaluation 02/27/20    Authorization Type [employer] $35 copay    Authorization Time Period VL: 20 OT    OT Start Time 1649    OT Stop Time 1743    OT Time Calculation (min) 54 min    Activity Tolerance Patient tolerated treatment well    Behavior During Therapy [facility] for tasks assessed/performed           Past Medical History:  Diagnosis Date  . Hypertension     Past Surgical History:  Procedure Laterality Date  . HERNIA REPAIR    . testicular torsion      There were no vitals filed for this visit.   Subjective Assessment - 01/27/20 1649    Subjective  Pt reports no changes. Pt reports he had a negative COVID rapid test after feeling sick last session. Pt denies any pain    Pertinent History Hypertension    Limitations Fall.    Patient Stated Goals "I didn't even know I had it" - After assessment pt said he wants to "get faster". "hold my son and stand up"    Currently in Pain? No/denies            Treatment:  12 pc puzzle - max assistance required for 12 pc puzzle for identifying straight edges and orientation of pieces . Pt required breaking it down to one piece at a time and verbal and visual cueing for identifying parts of piece and parts to whole in puzzle.  MVPT revised - 30/39 with 77% accuracy requiring increased time and demonstrated significant bradyphrenia.   PVC pipe tree - separated pieces with increased time and with no difficulty. Copying pattern required increased time and max verbal and visual cues for following pattern  and identifying pieces. Difficulty with differentiation of pieces.                     OT Short Term Goals - 01/27/20 1733      OT SHORT TERM GOAL #1   Title Pt will be independent with HEP 01/29/20    Time 4    Period Weeks    Status On-going    Target Date 01/29/20      OT SHORT TERM GOAL #2   Title Pt will perform physical and cognitive task simultaneously with 90% accuracy    Time 4    Period Weeks    Status On-going      OT SHORT TERM GOAL #3   Title Pt will perform a meal consisting of at least 3 steps with supervision, adapted strategies and good safety awareness.    Time 4    Period Weeks    Status On-going   pt made eggs with increased time and supervision for sequencing and finding items     OT SHORT TERM GOAL #4   Title Pt will improve 9 hole peg test score by 5 seconds bilaterally in order to increase fine motor coordination in BUE.    Baseline RUE 87.16 seconds; LUE 70.38 seconds  Time 4    Period Weeks    Status New      OT SHORT TERM GOAL #5   Title Pt will complete modreately complex visual perceptual task with min assistance..    Time 4    Period Weeks    Status On-going   max A for 12 pc puzzle, MVPT 77%     OT SHORT TERM GOAL #6   Title Pt will navigate environment while carrying a plate of food or cup of liquid in either UE with minimal spills or drops in order to increase coordination.    Time 4    Period Weeks    Status On-going      OT SHORT TERM GOAL #7   Title Pt will demonstrate ability to hold and carry a styrofoam cup with mod I for modulation of load force with BUE.    Time 4    Period Weeks    Status New             OT Long Term Goals - 01/01/20 1850      OT LONG TERM GOAL #1   Title Pt will be independent with updated HEP 02/26/20    Time 8    Period Weeks    Status New    Target Date 02/26/20      OT LONG TERM GOAL #2   Title Pt will write 3-5 sentences with 100% legibility in appropriate time in order  to increase speed for handwritten tasks.    Time 8    Period Weeks    Status New      OT LONG TERM GOAL #3   Title Pt will perform a meal consisting of at least 3 steps with mod I, adapted strategies and good safety awareness.    Time 8    Period Weeks    Status New      OT LONG TERM GOAL #4   Title Pt will improve 9 hole peg test score by 10 seconds bilaterally in order to increase fine motor coordination in BUE.    Baseline RUE 87.16 seocnds, LUE 70.38 seconds    Time 8    Period Weeks    Status New      OT LONG TERM GOAL #5   Title Pt will navigate environment while carrying a plate of food or cup of liquid in either UE with no spills or drops in order to increase coordination.    Time 8    Period Weeks    Status New      OT LONG TERM GOAL #6   Title Pt will demonstrate ability to carry a 20 lb object with appropriate modulation with grip in order to prepare for return to holding and carrying child.    Time 8    Period Weeks    Status New      OT LONG TERM GOAL #7   Title Pt will complete work simulated tasks with 90% accuracy.    Time 8    Period Weeks    Status New      OT LONG TERM GOAL #8   Title Pt will verbalize understanding of adapted strategies for completing basic ADLs and IADLs.    Time 8    Period Weeks    Status New                 Plan - 01/27/20 1653    Clinical Impression Statement Pt is progressing towards goals. Continuing to  notice visual perceptual deficits and deficits with coordination and self awarness into deficits. MVPT 77% accuracy this day.    OT Occupational Profile and History Detailed Assessment- Review of Records and additional review of physical, cognitive, psychosocial history related to current functional performance    Occupational performance deficits (Please refer to evaluation for details): IADL's;ADL's;Work;Leisure    Body Structure / Function / Physical Skills ADL;Decreased knowledge of use of  DME;Balance;Dexterity;GMC;Gait;Strength;UE functional use;Proprioception;IADL;ROM;Vestibular;Vision;Coordination;Flexibility;Mobility;Sensation;FMC    Cognitive Skills Attention;Problem Solve;Safety Awareness;Perception;Understand;Learn    Rehab Potential Good    Clinical Decision Making Limited treatment options, no task modification necessary    Comorbidities Affecting Occupational Performance: May have comorbidities impacting occupational performance    Modification or Assistance to Complete Evaluation  No modification of tasks or assist necessary to complete eval    OT Frequency 2x / week    OT Duration 8 weeks    OT Treatment/Interventions Self-care/ADL training;DME and/or AE instruction;Balance training;Therapeutic activities;Therapeutic exercise;Energy conservation;Neuromuscular education;Patient/family education;Visual/perceptual remediation/compensation;Functional Mobility Training;Cognitive remediation/compensation    Plan UE coordination, functional ambulation while carrying items    Consulted and Agree with Plan of Care Patient           Patient will benefit from skilled therapeutic intervention in order to improve the following deficits and impairments:   Body Structure / Function / Physical Skills: ADL, Decreased knowledge of use of DME, Balance, Dexterity, GMC, Gait, Strength, UE functional use, Proprioception, IADL, ROM, Vestibular, Vision, Coordination, Flexibility, Mobility, Sensation, [facility] Cognitive Skills: Attention, Problem Solve, Safety Awareness, Perception, Understand, Learn     Visit Diagnosis: Other lack of coordination  Other abnormalities of gait and mobility  Frontal lobe and executive function deficit  Visuospatial deficit  Attention and concentration deficit    Problem List Patient Active Problem List   Diagnosis Date Noted  . Anterior dislocation of right shoulder 03/01/2016    Junious Dresser MOT, OTR/L  01/27/2020, 5:43 PM  [facility] [address] [place], [place], [address] Phone: [phone]   Fax:  [phone]  Name: Samuel Stanley MRN: [id number] Date of Birth: Jan 16, 1991

## 2020-01-27 NOTE — Therapy (Signed)
[facility] [address] [place], [place], [address] Phone: [phone]   Fax:  [phone]  Physical Therapy Treatment/Discharge Summary  Patient Details  Name: Samuel Stanley MRN: [id number] Date of Birth: [birth year] Referring Provider (PT): Leitha Schuller, DO  PHYSICAL THERAPY DISCHARGE SUMMARY  Visits from Start of Care: 19  Current functional level related to goals / functional outcomes: See Clinical Impression Statement for Details   Remaining deficits: Increased Fall Risk, Abnormal Gait, Impaired Balance   Education / Equipment: Educated on land and aquatic HEP  Plan: Patient agrees to discharge.  Patient goals were partially met. Patient is being discharged due to meeting the stated rehab goals.  ?????          Encounter Date: 01/27/2020    PT End of Session - 01/27/20 1619    Visit Number 19    Number of Visits 19    Date for PT Re-Evaluation 03/01/20   POC for 4 weeks, Cert for 60 days   Authorization Type [employer] (VL: 20)    PT Start Time 1616    PT Stop Time 1648    PT Time Calculation (min) 32 min    Equipment Utilized During Treatment Other (comment)   bar bells   Activity Tolerance Patient tolerated treatment well    Behavior During Therapy WFL for tasks assessed/performed           Past Medical History:  Diagnosis Date  . Hypertension     Past Surgical History:  Procedure Laterality Date  . HERNIA REPAIR    . testicular torsion      There were no vitals filed for this visit.   Subjective Assessment - 01/27/20 1618    Subjective No new complaints since last visit. Patient reports that the pool closed. No falls    Patient is accompained by: Family member    Pertinent History Hypertension    Limitations Walking;Standing;House hold activities    Patient Stated Goals Being able to walk and stand    Currently in Pain? No/denies                [facility] Adult PT  Treatment/Exercise - 01/27/20 0001      Transfers   Transfers Sit to Stand;Stand to Sit    Sit to Stand 5: Supervision    Five time sit to stand comments  17.3 secs w/ UE support, 19.1 secs w/o UE support    Stand to Sit 5: Supervision      Ambulation/Gait   Ambulation/Gait Yes    Ambulation/Gait Assistance 5: Supervision    Ambulation/Gait Assistance Details into/out of therapy session    Ambulation Distance (Feet) --   clinic distances   Assistive device None    Gait Pattern Step-through pattern;Decreased arm swing - right;Decreased arm swing - left;Decreased step length - right;Decreased step length - left;Ataxic;Wide base of support    Ambulation Surface Level;Indoor    Stairs Yes    Stairs Assistance 5: Supervision    Stairs Assistance Details (indicate cue type and reason) completed acsend/descend stairs with single rail on R side x 12 steps with supervision throughout.     Stair Management Technique One rail Right;Alternating pattern;Forwards    Number of Stairs 12    Height of Stairs 6      Standardized Balance Assessment   Standardized Balance Assessment Berg Balance Test      Berg Balance Test   Sit to Stand Able to stand without using hands  and stabilize independently    Standing Unsupported Able to stand safely 2 minutes    Sitting with Back Unsupported but Feet Supported on Floor or Stool Able to sit safely and securely 2 minutes    Stand to Sit Sits safely with minimal use of hands    Transfers Able to transfer safely, minor use of hands    Standing Unsupported with Eyes Closed Able to stand 10 seconds with supervision    Standing Ubsupported with Feet Together Able to place feet together independently and stand for 1 minute with supervision    From Standing, Reach Forward with Outstretched Arm Can reach confidently >25 cm (10")    From Standing Position, Pick up Object from [place] to pick up shoe safely and easily    From Standing Position, Turn to Look Behind  Over each Shoulder Looks behind one side only/other side shows less weight shift    Turn 360 Degrees Able to turn 360 degrees safely in 4 seconds or less    Standing Unsupported, Alternately Place Feet on Step/Stool Able to complete 4 steps without aid or supervision    Standing Unsupported, One Foot in Front Able to plae foot ahead of the other independently and hold 30 seconds    Standing on One Leg Tries to lift leg/unable to hold 3 seconds but remains standing independently    Total Score 47      Exercises   Exercises Other Exercises    Other Exercises  Reviewed current HEP with patient.           Access Code: [email or web address] URL: [email or web address] Date: 01/27/2020 Prepared by: Baldomero Lamy  Exercises Side Stepping with Counter Support - 2 x daily - 7 x weekly - 1 sets - 3-4 reps Alternating tapping cup at counter - 1 x daily - 7 x weekly - 3 sets - 10 reps Standing Romberg to 1/2 Tandem Stance - 1 x daily - 7 x weekly - 1 sets - 3 reps - 30 sec hold standing eyes open feet together - 1 x daily - 7 x weekly - 1 sets - 3 reps - 30 sec hold Standing with Head Rotation - 1 x daily - 7 x weekly - 1 sets - 3 reps - 30 sec hold  PT provided aquatic HEP and [employer].       PT Education - 01/27/20 1649    Education Details progress toward LTG's, readiness for discharge. aquatic/land HEP    Person(s) Educated Patient    Methods Explanation;Handout    Comprehension Verbalized understanding            PT Short Term Goals - 01/01/20 1919      PT SHORT TERM GOAL #1   Title = LTGs             PT Long Term Goals - 01/27/20 1625      PT LONG TERM GOAL #1   Title Patient will be independent with final HEP for balance/strengthening (ALL LTGs Due: 01/29/20)    Baseline independent with aquatic and land HEP    Time 4    Period Weeks    Status Achieved      PT LONG TERM GOAL #2   Title Pt will increase Berg score >/= 46/56 to demonstrate improved balance and  decreased fall risk.    Baseline 11/12/19 38/56, 44/56, 47/56    Time 4    Period Weeks    Status Achieved  PT LONG TERM GOAL #3   Title Patient will demo ability to ascend/descend 12 stairs with supervision to allow for improved functional mobility    Baseline single rail, 12 stairs, alteranting pattern, supervision    Time 4    Period Weeks    Status Achieved      PT LONG TERM GOAL #4   Title Patient will improve 5x sit<> stand to </= 15 seconds with UE to demo improved functional mobility    Baseline 23.84 secs, 18.1 secs w/ UE support, 17.3 secs w/ UE support    Time 4    Period Weeks    Status Not Met                 Plan - 01/27/20 1650    Clinical Impression Statement Today's skilled PT session included assessment of patient's progress toward all LTGs. Patient able to meet LTG #1, 2, and 3. Patietn demonstrating improved balance with Berg Balance score of 47/56, still at increased risk for falls. Patient demonstrating progress toward LTG #4, able to complete 5x sit <> stand with UE support in 17.3 seconds. Reviewed current land and aquatic HEP with patient, educating on importance of compliance to maintain gains achieved with PT services.    Personal Factors and Comorbidities Comorbidity 1;Time since onset of injury/illness/exacerbation    Comorbidities HTN    Examination-Activity Limitations Bend;Carry;Caring for Others;Stairs;Stand;Locomotion Level;Squat;Transfers    Examination-Participation Restrictions Occupation;Community Activity;Yard Work    [employer] Evolving/Moderate complexity    Rehab Potential Fair    PT Frequency 2x / week    PT Duration 3 weeks   followed by 1x/week for 1 week   PT Treatment/Interventions ADLs/Self Care Home Management;Aquatic Therapy;Electrical Stimulation;Moist Heat;Contrast Bath;DME Instruction;Gait training;Functional mobility training;Neuromuscular re-education;Stair training;Therapeutic  activities;Patient/family education;Therapeutic exercise;Balance training;Orthotic Fit/Training;Manual techniques;Passive range of motion    Consulted and Agree with Plan of Care Patient           Patient will benefit from skilled therapeutic intervention in order to improve the following deficits and impairments:  Abnormal gait, Decreased coordination, Difficulty walking, Decreased safety awareness, Decreased activity tolerance, Impaired sensation, Postural dysfunction, Decreased strength, Decreased mobility, Decreased balance, Decreased knowledge of use of DME, Decreased endurance  Visit Diagnosis: Other abnormalities of gait and mobility  Unsteadiness on feet  Ataxic gait  Muscle weakness (generalized)     Problem List Patient Active Problem List   Diagnosis Date Noted  . Anterior dislocation of right shoulder 03/01/2016    Jones Bales, PT, DPT 01/27/2020, 4:53 PM  [facility] [address] [place], [place], [address] Phone: [phone]   Fax:  [phone]  Name: Samuel Stanley MRN: [id number] Date of Birth: Feb 14, 1991

## 2020-01-29 ENCOUNTER — Encounter: Payer: Self-pay | Admitting: Occupational Therapy

## 2020-01-29 ENCOUNTER — Other Ambulatory Visit: Payer: Self-pay

## 2020-01-29 ENCOUNTER — Ambulatory Visit: Payer: Commercial Managed Care - PPO | Admitting: Occupational Therapy

## 2020-01-29 DIAGNOSIS — R41840 Attention and concentration deficit: Secondary | ICD-10-CM

## 2020-01-29 DIAGNOSIS — R41844 Frontal lobe and executive function deficit: Secondary | ICD-10-CM

## 2020-01-29 DIAGNOSIS — R41842 Visuospatial deficit: Secondary | ICD-10-CM

## 2020-01-29 DIAGNOSIS — R2689 Other abnormalities of gait and mobility: Secondary | ICD-10-CM | POA: Diagnosis not present

## 2020-01-29 DIAGNOSIS — R278 Other lack of coordination: Secondary | ICD-10-CM

## 2020-01-29 DIAGNOSIS — R2681 Unsteadiness on feet: Secondary | ICD-10-CM

## 2020-01-29 NOTE — Therapy (Signed)
[facility] [address] [place], [place], [address] Phone: [phone]   Fax:  [phone]  Occupational Therapy Treatment  Patient Details  Name: Kaysan Peixoto MRN: [id number] Date of Birth: 17-Aug-1990 Referring Provider (OT): Dr. Francine Graven   Encounter Date: 01/29/2020   OT End of Session - 01/29/20 1701    Visit Number 7    Number of Visits 17    Date for OT Re-Evaluation 02/27/20    Authorization Type [employer] $35 copay    Authorization Time Period VL: 20 OT    OT Start Time 1701    OT Stop Time 1745    OT Time Calculation (min) 44 min    Activity Tolerance Patient tolerated treatment well    Behavior During Therapy [facility] for tasks assessed/performed           Past Medical History:  Diagnosis Date   Hypertension     Past Surgical History:  Procedure Laterality Date   HERNIA REPAIR     testicular torsion      There were no vitals filed for this visit.   Subjective Assessment - 01/29/20 1702    Subjective  Pt reports no changes and denies any pain. Pt says it's the first time he's had to wear a mask since he doesn't have to wear them at work anymore. Pt denies any difficulties or challenges at home.    Pertinent History Hypertension    Limitations Fall.    Patient Stated Goals "I didn't even know I had it" - After assessment pt said he wants to "get faster". "hold my son and stand up"    Currently in Pain? No/denies            Treatment:  Small pegboard design with LUE (dominant hand) - increased time for getting started and with processing for copying pattern. Reversals noted of some of the pattern. Patient able to identify errors with 2-3 verbal and visual cues and corrected by removing all pegs and starting over. Coordination slow but no drops during task.                       OT Short Term Goals - 01/27/20 1733      OT SHORT TERM GOAL #1   Title Pt will be independent  with HEP 01/29/20    Time 4    Period Weeks    Status On-going    Target Date 01/29/20      OT SHORT TERM GOAL #2   Title Pt will perform physical and cognitive task simultaneously with 90% accuracy    Time 4    Period Weeks    Status On-going      OT SHORT TERM GOAL #3   Title Pt will perform a meal consisting of at least 3 steps with supervision, adapted strategies and good safety awareness.    Time 4    Period Weeks    Status On-going   pt made eggs with increased time and supervision for sequencing and finding items     OT SHORT TERM GOAL #4   Title Pt will improve 9 hole peg test score by 5 seconds bilaterally in order to increase fine motor coordination in BUE.    Baseline RUE 87.16 seconds; LUE 70.38 seconds    Time 4    Period Weeks    Status New      OT SHORT TERM GOAL #5   Title Pt will complete  modreately complex visual perceptual task with min assistance..    Time 4    Period Weeks    Status On-going   max A for 12 pc puzzle, MVPT 77%     OT SHORT TERM GOAL #6   Title Pt will navigate environment while carrying a plate of food or cup of liquid in either UE with minimal spills or drops in order to increase coordination.    Time 4    Period Weeks    Status On-going      OT SHORT TERM GOAL #7   Title Pt will demonstrate ability to hold and carry a styrofoam cup with mod I for modulation of load force with BUE.    Time 4    Period Weeks    Status New             OT Long Term Goals - 01/01/20 1850      OT LONG TERM GOAL #1   Title Pt will be independent with updated HEP 02/26/20    Time 8    Period Weeks    Status New    Target Date 02/26/20      OT LONG TERM GOAL #2   Title Pt will write 3-5 sentences with 100% legibility in appropriate time in order to increase speed for handwritten tasks.    Time 8    Period Weeks    Status New      OT LONG TERM GOAL #3   Title Pt will perform a meal consisting of at least 3 steps with mod I, adapted  strategies and good safety awareness.    Time 8    Period Weeks    Status New      OT LONG TERM GOAL #4   Title Pt will improve 9 hole peg test score by 10 seconds bilaterally in order to increase fine motor coordination in BUE.    Baseline RUE 87.16 seocnds, LUE 70.38 seconds    Time 8    Period Weeks    Status New      OT LONG TERM GOAL #5   Title Pt will navigate environment while carrying a plate of food or cup of liquid in either UE with no spills or drops in order to increase coordination.    Time 8    Period Weeks    Status New      OT LONG TERM GOAL #6   Title Pt will demonstrate ability to carry a 20 lb object with appropriate modulation with grip in order to prepare for return to holding and carrying child.    Time 8    Period Weeks    Status New      OT LONG TERM GOAL #7   Title Pt will complete work simulated tasks with 90% accuracy.    Time 8    Period Weeks    Status New      OT LONG TERM GOAL #8   Title Pt will verbalize understanding of adapted strategies for completing basic ADLs and IADLs.    Time 8    Period Weeks    Status New                 Plan - 01/29/20 1717    Clinical Impression Statement Pt is progressing towards goals. Continued visual perceptual deficits noted and impeding overall ability for ADLs and IADLs and driving.    OT Occupational Profile and History Detailed Assessment- Review of Records and additional  review of physical, cognitive, psychosocial history related to current functional performance    Occupational performance deficits (Please refer to evaluation for details): IADL's;ADL's;Work;Leisure    Body Structure / Function / Physical Skills ADL;Decreased knowledge of use of DME;Balance;Dexterity;GMC;Gait;Strength;UE functional use;Proprioception;IADL;ROM;Vestibular;Vision;Coordination;Flexibility;Mobility;Sensation;FMC    Cognitive Skills Attention;Problem Solve;Safety Awareness;Perception;Understand;Learn    Rehab Potential  Good    Clinical Decision Making Limited treatment options, no task modification necessary    Comorbidities Affecting Occupational Performance: May have comorbidities impacting occupational performance    Modification or Assistance to Complete Evaluation  No modification of tasks or assist necessary to complete eval    OT Frequency 2x / week    OT Duration 8 weeks    OT Treatment/Interventions Self-care/ADL training;DME and/or AE instruction;Balance training;Therapeutic activities;Therapeutic exercise;Energy conservation;Neuromuscular education;Patient/family education;Visual/perceptual remediation/compensation;Functional Mobility Training;Cognitive remediation/compensation    Plan UE coordination, functional ambulation while carrying items, visual perceptual tasks (Constant Therapy)    Consulted and Agree with Plan of Care Patient           Patient will benefit from skilled therapeutic intervention in order to improve the following deficits and impairments:   Body Structure / Function / Physical Skills: ADL, Decreased knowledge of use of DME, Balance, Dexterity, GMC, Gait, Strength, UE functional use, Proprioception, IADL, ROM, Vestibular, Vision, Coordination, Flexibility, Mobility, Sensation, [facility] Cognitive Skills: Attention, Problem Solve, Safety Awareness, Perception, Understand, Learn     Visit Diagnosis: Other lack of coordination  Frontal lobe and executive function deficit  Visuospatial deficit  Attention and concentration deficit  Unsteadiness on feet    Problem List Patient Active Problem List   Diagnosis Date Noted   Anterior dislocation of right shoulder 03/01/2016    Junious Dresser  MOT, OTR/L  01/29/2020, 6:33 PM  [facility] [address] [place], [place], [address] Phone: [phone]   Fax:  [phone]  Name: Siegfried Vieth MRN: [id number] Date of Birth: 1990-08-27

## 2020-02-02 ENCOUNTER — Other Ambulatory Visit: Payer: Self-pay

## 2020-02-02 ENCOUNTER — Ambulatory Visit: Payer: Commercial Managed Care - PPO | Admitting: Occupational Therapy

## 2020-02-02 ENCOUNTER — Encounter: Payer: Self-pay | Admitting: Occupational Therapy

## 2020-02-02 DIAGNOSIS — R2681 Unsteadiness on feet: Secondary | ICD-10-CM

## 2020-02-02 DIAGNOSIS — R41844 Frontal lobe and executive function deficit: Secondary | ICD-10-CM

## 2020-02-02 DIAGNOSIS — R278 Other lack of coordination: Secondary | ICD-10-CM

## 2020-02-02 DIAGNOSIS — R41840 Attention and concentration deficit: Secondary | ICD-10-CM

## 2020-02-02 DIAGNOSIS — R2689 Other abnormalities of gait and mobility: Secondary | ICD-10-CM | POA: Diagnosis not present

## 2020-02-02 DIAGNOSIS — R41842 Visuospatial deficit: Secondary | ICD-10-CM

## 2020-02-02 NOTE — Therapy (Signed)
[facility] [address] [place], [place], [address] Phone: [phone]   Fax:  [phone]  Occupational Therapy Treatment  Patient Details  Name: Samuel Stanley MRN: [id number] Date of Birth: 1990-07-03 Referring Provider (OT): Dr. Francine Graven   Encounter Date: 02/02/2020   OT End of Session - 02/02/20 1704    Visit Number 8    Number of Visits 17    Date for OT Re-Evaluation 02/27/20    Authorization Type [employer] $35 copay    Authorization Time Period VL: 20 OT    OT Start Time 1703    OT Stop Time 1745    OT Time Calculation (min) 42 min    Activity Tolerance Patient tolerated treatment well    Behavior During Therapy [facility] for tasks assessed/performed           Past Medical History:  Diagnosis Date  . Hypertension     Past Surgical History:  Procedure Laterality Date  . HERNIA REPAIR    . testicular torsion      There were no vitals filed for this visit.   Subjective Assessment - 02/02/20 1704    Subjective  Pt reports he will start working 6 days a week after Thanksgiving. Pt denies any pain.    Pertinent History Hypertension    Limitations Fall.    Patient Stated Goals "I didn't even know I had it" - After assessment pt said he wants to "get faster". "hold my son and stand up"    Currently in Pain? No/denies           Treatment: Spot It with emphasis on visual scanning, attention and word finding. Min increased time with no additional cues required.   Alternating symbols on Constant Therapy level 3 - some difficulty with attention to detail and scanning entire board. Completed with 97% accuracy.   Repeat a Pattern on Constant Therapy level 2 83% - approx replay x 5 errors x 7 with activity.  Tangrams simple patterns with outlines - max A for orientation of shapes  Same Symbols on Constant Therapy level 4 - better attention to detail with activity. Completed with 98%  accuracy.                    OT Short Term Goals - 01/27/20 1733      OT SHORT TERM GOAL #1   Title Pt will be independent with HEP 01/29/20    Time 4    Period Weeks    Status On-going    Target Date 01/29/20      OT SHORT TERM GOAL #2   Title Pt will perform physical and cognitive task simultaneously with 90% accuracy    Time 4    Period Weeks    Status On-going      OT SHORT TERM GOAL #3   Title Pt will perform a meal consisting of at least 3 steps with supervision, adapted strategies and good safety awareness.    Time 4    Period Weeks    Status On-going   pt made eggs with increased time and supervision for sequencing and finding items     OT SHORT TERM GOAL #4   Title Pt will improve 9 hole peg test score by 5 seconds bilaterally in order to increase fine motor coordination in BUE.    Baseline RUE 87.16 seconds; LUE 70.38 seconds    Time 4    Period Weeks    Status New  OT SHORT TERM GOAL #5   Title Pt will complete modreately complex visual perceptual task with min assistance..    Time 4    Period Weeks    Status On-going   max A for 12 pc puzzle, MVPT 77%     OT SHORT TERM GOAL #6   Title Pt will navigate environment while carrying a plate of food or cup of liquid in either UE with minimal spills or drops in order to increase coordination.    Time 4    Period Weeks    Status On-going      OT SHORT TERM GOAL #7   Title Pt will demonstrate ability to hold and carry a styrofoam cup with mod I for modulation of load force with BUE.    Time 4    Period Weeks    Status New             OT Long Term Goals - 01/01/20 1850      OT LONG TERM GOAL #1   Title Pt will be independent with updated HEP 02/26/20    Time 8    Period Weeks    Status New    Target Date 02/26/20      OT LONG TERM GOAL #2   Title Pt will write 3-5 sentences with 100% legibility in appropriate time in order to increase speed for handwritten tasks.    Time 8     Period Weeks    Status New      OT LONG TERM GOAL #3   Title Pt will perform a meal consisting of at least 3 steps with mod I, adapted strategies and good safety awareness.    Time 8    Period Weeks    Status New      OT LONG TERM GOAL #4   Title Pt will improve 9 hole peg test score by 10 seconds bilaterally in order to increase fine motor coordination in BUE.    Baseline RUE 87.16 seocnds, LUE 70.38 seconds    Time 8    Period Weeks    Status New      OT LONG TERM GOAL #5   Title Pt will navigate environment while carrying a plate of food or cup of liquid in either UE with no spills or drops in order to increase coordination.    Time 8    Period Weeks    Status New      OT LONG TERM GOAL #6   Title Pt will demonstrate ability to carry a 20 lb object with appropriate modulation with grip in order to prepare for return to holding and carrying child.    Time 8    Period Weeks    Status New      OT LONG TERM GOAL #7   Title Pt will complete work simulated tasks with 90% accuracy.    Time 8    Period Weeks    Status New      OT LONG TERM GOAL #8   Title Pt will verbalize understanding of adapted strategies for completing basic ADLs and IADLs.    Time 8    Period Weeks    Status New                 Plan - 02/02/20 1704    Clinical Impression Statement Pt is progressing towards goals.    OT Occupational Profile and History Detailed Assessment- Review of Records and additional review of physical, cognitive,  psychosocial history related to current functional performance    Occupational performance deficits (Please refer to evaluation for details): IADL's;ADL's;Work;Leisure    Body Structure / Function / Physical Skills ADL;Decreased knowledge of use of DME;Balance;Dexterity;GMC;Gait;Strength;UE functional use;Proprioception;IADL;ROM;Vestibular;Vision;Coordination;Flexibility;Mobility;Sensation;FMC    Cognitive Skills Attention;Problem Solve;Safety  Awareness;Perception;Understand;Learn    Rehab Potential Good    Clinical Decision Making Limited treatment options, no task modification necessary    Comorbidities Affecting Occupational Performance: May have comorbidities impacting occupational performance    Modification or Assistance to Complete Evaluation  No modification of tasks or assist necessary to complete eval    OT Frequency 2x / week    OT Duration 8 weeks    OT Treatment/Interventions Self-care/ADL training;DME and/or AE instruction;Balance training;Therapeutic activities;Therapeutic exercise;Energy conservation;Neuromuscular education;Patient/family education;Visual/perceptual remediation/compensation;Functional Mobility Training;Cognitive remediation/compensation    Plan UE coordination, functional ambulation while carrying items, visual perceptual tasks (Constant Therapy)    Consulted and Agree with Plan of Care Patient           Patient will benefit from skilled therapeutic intervention in order to improve the following deficits and impairments:   Body Structure / Function / Physical Skills: ADL, Decreased knowledge of use of DME, Balance, Dexterity, GMC, Gait, Strength, UE functional use, Proprioception, IADL, ROM, Vestibular, Vision, Coordination, Flexibility, Mobility, Sensation, [facility] Cognitive Skills: Attention, Problem Solve, Safety Awareness, Perception, Understand, Learn     Visit Diagnosis: Other lack of coordination  Frontal lobe and executive function deficit  Visuospatial deficit  Attention and concentration deficit  Unsteadiness on feet    Problem List Patient Active Problem List   Diagnosis Date Noted  . Anterior dislocation of right shoulder 03/01/2016    Junious Dresser MOT, OTR/L  02/02/2020, 5:43 PM  [facility] [address] [place], [place], [address] Phone: [phone]   Fax:  [phone]  Name: Samuel Stanley MRN:  [id number] Date of Birth: 1990/04/26

## 2020-02-04 ENCOUNTER — Ambulatory Visit: Payer: Commercial Managed Care - PPO | Admitting: Occupational Therapy

## 2020-02-10 ENCOUNTER — Other Ambulatory Visit: Payer: Self-pay

## 2020-02-10 ENCOUNTER — Encounter: Payer: Self-pay | Admitting: Occupational Therapy

## 2020-02-10 ENCOUNTER — Ambulatory Visit: Payer: Commercial Managed Care - PPO | Admitting: Occupational Therapy

## 2020-02-10 DIAGNOSIS — R2681 Unsteadiness on feet: Secondary | ICD-10-CM

## 2020-02-10 DIAGNOSIS — R41842 Visuospatial deficit: Secondary | ICD-10-CM

## 2020-02-10 DIAGNOSIS — M6281 Muscle weakness (generalized): Secondary | ICD-10-CM

## 2020-02-10 DIAGNOSIS — R278 Other lack of coordination: Secondary | ICD-10-CM

## 2020-02-10 DIAGNOSIS — R41840 Attention and concentration deficit: Secondary | ICD-10-CM

## 2020-02-10 DIAGNOSIS — R2689 Other abnormalities of gait and mobility: Secondary | ICD-10-CM | POA: Diagnosis not present

## 2020-02-10 DIAGNOSIS — R41844 Frontal lobe and executive function deficit: Secondary | ICD-10-CM

## 2020-02-10 NOTE — Therapy (Signed)
[facility] [address] [place], [place], [address] Phone: [phone]   Fax:  [phone]  Occupational Therapy Treatment  Patient Details  Name: Samuel Stanley MRN: [id number] Date of Birth: 1990/04/10 Referring Provider (OT): Dr. Leitha Schuller   Encounter Date: 02/10/2020   OT End of Session - 02/10/20 1711    Visit Number 9    Number of Visits 17    Date for OT Re-Evaluation 02/27/20    Authorization Type [employer] $35 copay    Authorization Time Period VL: 20 OT (Week 6/8 - 11/30)    OT Start Time 1711    OT Stop Time 1745    OT Time Calculation (min) 34 min    Activity Tolerance Patient tolerated treatment well    Behavior During Therapy [facility] for tasks assessed/performed           Past Medical History:  Diagnosis Date  . Hypertension     Past Surgical History:  Procedure Laterality Date  . HERNIA REPAIR    . testicular torsion      There were no vitals filed for this visit.   Subjective Assessment - 02/10/20 1712    Subjective  "I just got off work!"    Pertinent History Hypertension    Limitations Fall.    Patient Stated Goals "I didn't even know I had it" - After assessment pt said he wants to "get faster". "hold my son and stand up"    Currently in Pain? No/denies            Treatment:  Physical task (ambulating while carrying styrofoam cup of liquid 1/3 full) and cognitive task (subtraction by 3's) simultaneously. Pt with approx 50% accuracy and max increased time for cognitive aspect. Seated and continued subtraction in min/mod distracting environment with much difficulty with subtraction of 3's while seated at table.    Copied small pegboard design (sailboat) - LUE coordination appropriate and with no drops. Pt copied pattern with increased time and approx 2-3 errors. Pt with difficulty with problem solving for correcting errors. Unable to detect and identify errors and unable to determine  how to fix without max A.                     OT Short Term Goals - 02/10/20 1721      OT SHORT TERM GOAL #1   Title Pt will be independent with HEP 01/29/20    Time 4    Period Weeks    Status On-going    Target Date 01/29/20      OT SHORT TERM GOAL #2   Title Pt will perform physical and cognitive task simultaneously with 90% accuracy    Time 4    Period Weeks    Status On-going   mod/max increased time with subtraction of 3's while walking and carrying cup with approx 50%     OT SHORT TERM GOAL #3   Title Pt will perform a meal consisting of at least 3 steps with supervision, adapted strategies and good safety awareness.    Time 4    Period Weeks    Status On-going   pt made eggs with increased time and supervision for sequencing and finding items     OT SHORT TERM GOAL #4   Title Pt will improve 9 hole peg test score by 5 seconds bilaterally in order to increase fine motor coordination in BUE.    Baseline RUE 87.16 seconds; LUE 70.38  seconds    Time 4    Period Weeks    Status New      OT SHORT TERM GOAL #5   Title Pt will complete modreately complex visual perceptual task with min assistance..    Time 4    Period Weeks    Status On-going   max A for 12 pc puzzle, MVPT 77%     OT SHORT TERM GOAL #6   Title Pt will navigate environment while carrying a plate of food or cup of liquid in either UE with minimal spills or drops in order to increase coordination.    Time 4    Period Weeks    Status Achieved      OT SHORT TERM GOAL #7   Title Pt will demonstrate ability to hold and carry a styrofoam cup with mod I for modulation of load force with BUE.    Time 4    Period Weeks    Status Achieved             OT Long Term Goals - 02/10/20 1727      OT LONG TERM GOAL #1   Title Pt will be independent with updated HEP 02/26/20    Time 8    Period Weeks    Status New      OT LONG TERM GOAL #2   Title Pt will write 3-5 sentences with 100%  legibility in appropriate time in order to increase speed for handwritten tasks.    Time 8    Period Weeks    Status New      OT LONG TERM GOAL #3   Title Pt will perform a meal consisting of at least 3 steps with mod I, adapted strategies and good safety awareness.    Time 8    Period Weeks    Status New      OT LONG TERM GOAL #4   Title Pt will improve 9 hole peg test score by 10 seconds bilaterally in order to increase fine motor coordination in BUE.    Baseline RUE 87.16 seocnds, LUE 70.38 seconds    Time 8    Period Weeks    Status New      OT LONG TERM GOAL #5   Title Pt will navigate environment while carrying a plate of food or cup of liquid in either UE with no spills or drops in order to increase coordination.    Time 8    Period Weeks    Status Achieved      OT LONG TERM GOAL #6   Title Pt will demonstrate ability to carry a 20 lb object with appropriate modulation with grip in order to prepare for return to holding and carrying child.    Time 8    Period Weeks    Status New      OT LONG TERM GOAL #7   Title Pt will complete work simulated tasks with 90% accuracy.    Time 8    Period Weeks    Status New      OT LONG TERM GOAL #8   Title Pt will verbalize understanding of adapted strategies for completing basic ADLs and IADLs.    Time 8    Period Weeks    Status New                 Plan - 02/10/20 1723    Clinical Impression Statement Pt has met 2/7 STGs and is progressing towards  remaining goals. Pt continues to have much difficulty with executive functioning and cognitive skills impeding ability to complete tasks simultaneously.    OT Occupational Profile and History Detailed Assessment- Review of Records and additional review of physical, cognitive, psychosocial history related to current functional performance    Occupational performance deficits (Please refer to evaluation for details): IADL's;ADL's;Work;Leisure    Body Structure / Function /  Physical Skills ADL;Decreased knowledge of use of DME;Balance;Dexterity;GMC;Gait;Strength;UE functional use;Proprioception;IADL;ROM;Vestibular;Vision;Coordination;Flexibility;Mobility;Sensation;FMC    Cognitive Skills Attention;Problem Solve;Safety Awareness;Perception;Understand;Learn    Rehab Potential Good    Clinical Decision Making Limited treatment options, no task modification necessary    Comorbidities Affecting Occupational Performance: May have comorbidities impacting occupational performance    Modification or Assistance to Complete Evaluation  No modification of tasks or assist necessary to complete eval    OT Frequency 2x / week    OT Duration 8 weeks    OT Treatment/Interventions Self-care/ADL training;DME and/or AE instruction;Balance training;Therapeutic activities;Therapeutic exercise;Energy conservation;Neuromuscular education;Patient/family education;Visual/perceptual remediation/compensation;Functional Mobility Training;Cognitive remediation/compensation    Plan UE coordination, visual perceptual tasks    Consulted and Agree with Plan of Care Patient           Patient will benefit from skilled therapeutic intervention in order to improve the following deficits and impairments:   Body Structure / Function / Physical Skills: ADL, Decreased knowledge of use of DME, Balance, Dexterity, GMC, Gait, Strength, UE functional use, Proprioception, IADL, ROM, Vestibular, Vision, Coordination, Flexibility, Mobility, Sensation, [facility] Cognitive Skills: Attention, Problem Solve, Safety Awareness, Perception, Understand, Learn     Visit Diagnosis: Other lack of coordination  Muscle weakness (generalized)  Frontal lobe and executive function deficit  Visuospatial deficit  Attention and concentration deficit  Unsteadiness on feet    Problem List Patient Active Problem List   Diagnosis Date Noted  . Anterior dislocation of right shoulder 03/01/2016    Zachery Conch  MOT, OTR/L  02/10/2020, 5:28 PM  [facility] [address] [place], [place], [address] Phone: [phone]   Fax:  [phone]  Name: Samuel Stanley MRN: [id number] Date of Birth: November 11, 1990

## 2020-02-12 ENCOUNTER — Ambulatory Visit: Payer: Commercial Managed Care - PPO | Attending: Neurology | Admitting: Occupational Therapy

## 2020-02-12 ENCOUNTER — Other Ambulatory Visit: Payer: Self-pay

## 2020-02-12 ENCOUNTER — Encounter: Payer: Self-pay | Admitting: Occupational Therapy

## 2020-02-12 DIAGNOSIS — R41844 Frontal lobe and executive function deficit: Secondary | ICD-10-CM | POA: Diagnosis present

## 2020-02-12 DIAGNOSIS — R41840 Attention and concentration deficit: Secondary | ICD-10-CM | POA: Diagnosis present

## 2020-02-12 DIAGNOSIS — R2681 Unsteadiness on feet: Secondary | ICD-10-CM | POA: Insufficient documentation

## 2020-02-12 DIAGNOSIS — R278 Other lack of coordination: Secondary | ICD-10-CM | POA: Insufficient documentation

## 2020-02-12 DIAGNOSIS — M6281 Muscle weakness (generalized): Secondary | ICD-10-CM | POA: Diagnosis present

## 2020-02-12 DIAGNOSIS — R41842 Visuospatial deficit: Secondary | ICD-10-CM | POA: Diagnosis present

## 2020-02-12 NOTE — Therapy (Signed)
[facility] [address] [place], [place], [address] Phone: [phone]   Fax:  [phone]  Occupational Therapy Treatment  Patient Details  Name: Hence Derrick MRN: [id number] Date of Birth: [birth year] Referring Provider (OT): Dr. Francine Graven   Encounter Date: 02/12/2020   OT End of Session - 02/12/20 1710    Visit Number 10    Number of Visits 17    Date for OT Re-Evaluation 02/27/20    Authorization Type [employer] $35 copay    Authorization Time Period VL: 20 OT (Week 6/8 - 11/30)    OT Start Time 1707    OT Stop Time 1745    OT Time Calculation (min) 38 min    Activity Tolerance Patient tolerated treatment well    Behavior During Therapy [facility] for tasks assessed/performed           Past Medical History:  Diagnosis Date  . Hypertension     Past Surgical History:  Procedure Laterality Date  . HERNIA REPAIR    . testicular torsion      There were no vitals filed for this visit.   Subjective Assessment - 02/12/20 1736    Subjective  "I was speeding to get here today - I was worried I was going to be late"    Pertinent History Hypertension    Limitations Fall.    Patient Stated Goals "I didn't even know I had it" - After assessment pt said he wants to "get faster". "hold my son and stand up"    Currently in Pain? No/denies                        OT Treatments/Exercises (OP) - 02/12/20 1736      Visual/Perceptual Exercises   Copy this Image PVC    PVC much improvement form previous time doing task. Pt copied PVC pipe tree picture with no additional assistance and min increased time.                    OT Short Term Goals - 02/12/20 1734      OT SHORT TERM GOAL #1   Title Pt will be independent with HEP 01/29/20    Time 4    Period Weeks    Status Achieved    Target Date 01/29/20      OT SHORT TERM GOAL #2   Title Pt will perform physical and cognitive task simultaneously  with 90% accuracy    Time 4    Period Weeks    Status On-going   mod/max increased time with subtraction of 3's while walking and carrying cup with approx 50%     OT SHORT TERM GOAL #3   Title Pt will perform a meal consisting of at least 3 steps with supervision, adapted strategies and good safety awareness.    Time 4    Period Weeks    Status On-going   pt made eggs with increased time and supervision for sequencing and finding items     OT SHORT TERM GOAL #4   Title Pt will improve 9 hole peg test score by 5 seconds bilaterally in order to increase fine motor coordination in BUE.    Baseline RUE 87.16 seconds; LUE 70.38 seconds    Time 4    Period Weeks    Status On-going      OT SHORT TERM GOAL #5   Title Pt will complete modreately complex visual  perceptual task with min assistance..    Time 4    Period Weeks    Status Achieved      OT SHORT TERM GOAL #6   Title Pt will navigate environment while carrying a plate of food or cup of liquid in either UE with minimal spills or drops in order to increase coordination.    Time 4    Period Weeks    Status Achieved      OT SHORT TERM GOAL #7   Title Pt will demonstrate ability to hold and carry a styrofoam cup with mod I for modulation of load force with BUE.    Time 4    Period Weeks    Status Achieved             OT Long Term Goals - 02/10/20 1727      OT LONG TERM GOAL #1   Title Pt will be independent with updated HEP 02/26/20    Time 8    Period Weeks    Status New      OT LONG TERM GOAL #2   Title Pt will write 3-5 sentences with 100% legibility in appropriate time in order to increase speed for handwritten tasks.    Time 8    Period Weeks    Status New      OT LONG TERM GOAL #3   Title Pt will perform a meal consisting of at least 3 steps with mod I, adapted strategies and good safety awareness.    Time 8    Period Weeks    Status New      OT LONG TERM GOAL #4   Title Pt will improve 9 hole peg test  score by 10 seconds bilaterally in order to increase fine motor coordination in BUE.    Baseline RUE 87.16 seocnds, LUE 70.38 seconds    Time 8    Period Weeks    Status New      OT LONG TERM GOAL #5   Title Pt will navigate environment while carrying a plate of food or cup of liquid in either UE with no spills or drops in order to increase coordination.    Time 8    Period Weeks    Status Achieved      OT LONG TERM GOAL #6   Title Pt will demonstrate ability to carry a 20 lb object with appropriate modulation with grip in order to prepare for return to holding and carrying child.    Time 8    Period Weeks    Status New      OT LONG TERM GOAL #7   Title Pt will complete work simulated tasks with 90% accuracy.    Time 8    Period Weeks    Status New      OT LONG TERM GOAL #8   Title Pt will verbalize understanding of adapted strategies for completing basic ADLs and IADLs.    Time 8    Period Weeks    Status New                 Plan - 02/12/20 1737    Clinical Impression Statement Pt continues to progress towards goals. Pt demonstrates much improvement for visual perceptual tasks this day.    OT Occupational Profile and History Detailed Assessment- Review of Records and additional review of physical, cognitive, psychosocial history related to current functional performance    Occupational performance deficits (Please refer to evaluation for  details): IADL's;ADL's;Work;Leisure    Body Structure / Function / Physical Skills ADL;Decreased knowledge of use of DME;Balance;Dexterity;GMC;Gait;Strength;UE functional use;Proprioception;IADL;ROM;Vestibular;Vision;Coordination;Flexibility;Mobility;Sensation;FMC    Cognitive Skills Attention;Problem Solve;Safety Awareness;Perception;Understand;Learn    Rehab Potential Good    Clinical Decision Making Limited treatment options, no task modification necessary    Comorbidities Affecting Occupational Performance: May have comorbidities  impacting occupational performance    Modification or Assistance to Complete Evaluation  No modification of tasks or assist necessary to complete eval    OT Frequency 2x / week    OT Duration 8 weeks    OT Treatment/Interventions Self-care/ADL training;DME and/or AE instruction;Balance training;Therapeutic activities;Therapeutic exercise;Energy conservation;Neuromuscular education;Patient/family education;Visual/perceptual remediation/compensation;Functional Mobility Training;Cognitive remediation/compensation    Plan UE coordination, visual perceptual tasks    Consulted and Agree with Plan of Care Patient           Patient will benefit from skilled therapeutic intervention in order to improve the following deficits and impairments:   Body Structure / Function / Physical Skills: ADL, Decreased knowledge of use of DME, Balance, Dexterity, GMC, Gait, Strength, UE functional use, Proprioception, IADL, ROM, Vestibular, Vision, Coordination, Flexibility, Mobility, Sensation, [facility] Cognitive Skills: Attention, Problem Solve, Safety Awareness, Perception, Understand, Learn     Visit Diagnosis: Other lack of coordination  Muscle weakness (generalized)  Frontal lobe and executive function deficit  Visuospatial deficit  Attention and concentration deficit  Unsteadiness on feet    Problem List Patient Active Problem List   Diagnosis Date Noted  . Anterior dislocation of right shoulder 03/01/2016    Junious Dresser MOT, OTR/L  02/12/2020, 5:39 PM  [facility] [address] [place], [place], [address] Phone: [phone]   Fax:  [phone]  Name: Terry Bolotin MRN: [id number] Date of Birth: 24-Jun-1990

## 2020-02-17 ENCOUNTER — Ambulatory Visit: Payer: Commercial Managed Care - PPO | Admitting: Occupational Therapy

## 2020-02-17 ENCOUNTER — Other Ambulatory Visit: Payer: Self-pay

## 2020-02-17 ENCOUNTER — Encounter: Payer: Self-pay | Admitting: Occupational Therapy

## 2020-02-17 DIAGNOSIS — R41844 Frontal lobe and executive function deficit: Secondary | ICD-10-CM

## 2020-02-17 DIAGNOSIS — R278 Other lack of coordination: Secondary | ICD-10-CM

## 2020-02-17 DIAGNOSIS — M6281 Muscle weakness (generalized): Secondary | ICD-10-CM

## 2020-02-17 DIAGNOSIS — R41840 Attention and concentration deficit: Secondary | ICD-10-CM

## 2020-02-17 DIAGNOSIS — R41842 Visuospatial deficit: Secondary | ICD-10-CM

## 2020-02-17 NOTE — Therapy (Signed)
[facility] [address] [place], [place], [address] Phone: [phone]   Fax:  [phone]  Occupational Therapy Treatment  Patient Details  Name: Samuel Stanley MRN: [id number] Date of Birth: 20-Oct-1990 Referring Provider (OT): Dr. Francine Graven   Encounter Date: 02/17/2020   OT End of Session - 02/17/20 1704    Visit Number 11    Number of Visits 17    Date for OT Re-Evaluation 02/27/20    Authorization Type [employer] $35 copay    Authorization Time Period VL: 20 OT (Week 7/8 - 12/7)    OT Start Time 1703    OT Stop Time 1743    OT Time Calculation (min) 40 min    Activity Tolerance Patient tolerated treatment well    Behavior During Therapy [facility] for tasks assessed/performed           Past Medical History:  Diagnosis Date  . Hypertension     Past Surgical History:  Procedure Laterality Date  . HERNIA REPAIR    . testicular torsion      There were no vitals filed for this visit.   Subjective Assessment - 02/17/20 1705    Subjective  "They told us when we left at 4:30 that we have to start working 7-5:30" Pt denies any pain today. Pt reports that he has no issues with buttons or fasteners. Pt also reports no difficulty with handwriting.    Pertinent History Hypertension    Limitations Fall.    Patient Stated Goals "I didn't even know I had it" - After assessment pt said he wants to "get faster". "hold my son and stand up"    Currently in Pain? 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 Pt required mod verbal cues for rotating in fingertips and not using trunk for rotating pieces. Easier with LUE>RUE                    OT Short Term Goals - 02/12/20 1734      OT SHORT TERM GOAL #1   Title Pt will be independent with HEP 01/29/20    Time 4    Period Weeks    Status Achieved    Target Date 01/29/20      OT SHORT TERM GOAL #2   Title Pt will perform physical and cognitive task simultaneously with 90% accuracy    Time 4    Period Weeks    Status On-going   mod/max increased time with subtraction of 3's while walking and carrying cup with approx 50%     OT SHORT TERM GOAL #3   Title Pt will perform a meal consisting of at least 3 steps with supervision, adapted strategies and good safety awareness.    Time 4  Period Weeks    Status On-going   pt made eggs with increased time and supervision for sequencing and finding items     OT SHORT TERM GOAL #4   Title Pt will improve 9 hole peg test score by 5 seconds bilaterally in order to increase fine motor coordination in BUE.    Baseline RUE 87.16 seconds; LUE 70.38 seconds    Time 4    Period Weeks    Status On-going      OT SHORT TERM GOAL #5   Title Pt will complete modreately complex visual perceptual task with min assistance..    Time 4    Period Weeks    Status Achieved      OT SHORT TERM GOAL #6   Title Pt will navigate environment while carrying a plate of food or cup of liquid in either UE with minimal spills or drops in order to increase coordination.    Time 4    Period Weeks    Status Achieved      OT SHORT TERM GOAL #7   Title Pt will demonstrate ability to hold and carry a styrofoam cup with mod I for modulation of load force with BUE.    Time 4    Period Weeks    Status Achieved             OT Long Term Goals - 02/17/20 1719      OT LONG  TERM GOAL #1   Title Pt will be independent with updated HEP 02/26/20    Time 8    Period Weeks    Status On-going      OT LONG TERM GOAL #2   Title Pt will write 3-5 sentences with 100% legibility in appropriate time in order to increase speed for handwritten tasks.    Time 8    Period Weeks    Status New      OT LONG TERM GOAL #3   Title Pt will perform a meal consisting of at least 3 steps with mod I, adapted strategies and good safety awareness.    Time 8    Period Weeks    Status On-going      OT LONG TERM GOAL #4   Title Pt will improve 9 hole peg test score by 10 seconds bilaterally in order to increase fine motor coordination in BUE.    Baseline RUE 87.16 seocnds, LUE 70.38 seconds    Time 8    Period Weeks    Status On-going      OT LONG TERM GOAL #5   Title Pt will navigate environment while carrying a plate of food or cup of liquid in either UE with no spills or drops in order to increase coordination.    Time 8    Period Weeks    Status Achieved      OT LONG TERM GOAL #6   Title Pt will demonstrate ability to carry a 20 lb object with appropriate modulation with grip in order to prepare for return to holding and carrying child.    Time 8    Period Weeks    Status On-going   carried 15 lb kettlebell     OT LONG TERM GOAL #7   Title Pt will complete work simulated tasks with 90% accuracy.    Time 8    Period Weeks    Status On-going   simulated work tasks this day     OT LONG TERM GOAL #8  Title Pt will verbalize understanding of adapted strategies for completing basic ADLs and IADLs.    Time 8    Period Weeks    Status On-going                 Plan - 02/17/20 1727    Clinical Impression Statement Pt is making progress and demonstrating improved strategies and coordination with daily activities. Pt with increased skill with visual perceptual tasks this day    OT Occupational Profile and History Detailed Assessment- Review of Records and  additional review of physical, cognitive, psychosocial history related to current functional performance    Occupational performance deficits (Please refer to evaluation for details): IADL's;ADL's;Work;Leisure    Body Structure / Function / Physical Skills ADL;Decreased knowledge of use of DME;Balance;Dexterity;GMC;Gait;Strength;UE functional use;Proprioception;IADL;ROM;Vestibular;Vision;Coordination;Flexibility;Mobility;Sensation;FMC    Cognitive Skills Attention;Problem Solve;Safety Awareness;Perception;Understand;Learn    Rehab Potential Good    Clinical Decision Making Limited treatment options, no task modification necessary    Comorbidities Affecting Occupational Performance: May have comorbidities impacting occupational performance    Modification or Assistance to Complete Evaluation  No modification of tasks or assist necessary to complete eval    OT Frequency 2x / week    OT Duration 8 weeks    OT Treatment/Interventions Self-care/ADL training;DME and/or AE instruction;Balance training;Therapeutic activities;Therapeutic exercise;Energy conservation;Neuromuscular education;Patient/family education;Visual/perceptual remediation/compensation;Functional Mobility Training;Cognitive remediation/compensation    Plan UE coordination, visual perceptual tasks    Consulted and Agree with Plan of Care Patient           Patient will benefit from skilled therapeutic intervention in order to improve the following deficits and impairments:   Body Structure / Function / Physical Skills: ADL, Decreased knowledge of use of DME, Balance, Dexterity, GMC, Gait, Strength, UE functional use, Proprioception, IADL, ROM, Vestibular, Vision, Coordination, Flexibility, Mobility, Sensation, [facility] Cognitive Skills: Attention, Problem Solve, Safety Awareness, Perception, Understand, Learn     Visit Diagnosis: Other lack of coordination  Muscle weakness (generalized)  Frontal lobe and executive function  deficit  Visuospatial deficit  Attention and concentration deficit    Problem List Patient Active Problem List   Diagnosis Date Noted  . Anterior dislocation of right shoulder 03/01/2016    Junious Dresser MOT, OTR/L  02/17/2020, 5:45 PM  [facility] [address], [place], [address] Phone: [phone]   Fax:  [phone]  Name: Samuel Stanley MRN: [id number] Date of Birth: 1990/04/09

## 2020-02-19 ENCOUNTER — Ambulatory Visit: Payer: Commercial Managed Care - PPO | Admitting: Occupational Therapy

## 2020-02-19 ENCOUNTER — Encounter: Payer: Self-pay | Admitting: Occupational Therapy

## 2020-02-19 ENCOUNTER — Other Ambulatory Visit: Payer: Self-pay

## 2020-02-19 DIAGNOSIS — M6281 Muscle weakness (generalized): Secondary | ICD-10-CM

## 2020-02-19 DIAGNOSIS — R278 Other lack of coordination: Secondary | ICD-10-CM

## 2020-02-19 DIAGNOSIS — R41840 Attention and concentration deficit: Secondary | ICD-10-CM

## 2020-02-19 DIAGNOSIS — R2681 Unsteadiness on feet: Secondary | ICD-10-CM

## 2020-02-19 DIAGNOSIS — R41844 Frontal lobe and executive function deficit: Secondary | ICD-10-CM

## 2020-02-19 DIAGNOSIS — R41842 Visuospatial deficit: Secondary | ICD-10-CM

## 2020-02-19 NOTE — Therapy (Signed)
[facility] [address] [place], [place], [address] Phone: [phone]   Fax:  [phone]  Occupational Therapy Treatment  Patient Details  Name: Samuel Stanley MRN: [id number] Date of Birth: August 12, 1990 Referring Provider (OT): Dr. Francine Graven   Encounter Date: 02/19/2020   OT End of Session - 02/19/20 1747    Visit Number 12    Number of Visits 17    Date for OT Re-Evaluation 02/27/20    Authorization Type [employer] $35 copay    Authorization Time Period VL: 20 OT (Week 7/8 - 12/7)    OT Start Time 1747    OT Stop Time 1830    OT Time Calculation (min) 43 min    Activity Tolerance Patient tolerated treatment well    Behavior During Therapy [facility] for tasks assessed/performed           Past Medical History:  Diagnosis Date  . Hypertension     Past Surgical History:  Procedure Laterality Date  . HERNIA REPAIR    . testicular torsion      There were no vitals filed for this visit.   Subjective Assessment - 02/19/20 1807    Subjective  "They had [place] working 6-5:30 today" Pt denies any pain.    Pertinent History Hypertension    Limitations Fall.    Patient Stated Goals "I didn't even know I had it" - After assessment pt said he wants to "get faster". "hold my son and stand up"    Currently in Pain? No/denies                        OT Treatments/Exercises (OP) - 02/19/20 1757      Work Hardening Exercises   UBE (Upper Arm Bike) 8 minutes level 2      Fine Motor Coordination (Hand/Wrist)   Fine Motor Coordination Manipulation of small objects;Handwriting    Manipulation of small objects 9 hole peg test LUE 51.59, RUE 84.47    Handwriting Tracing shapes, tracing ABC and distal finger control. Pt with good tracing with LUE (dominant hand) with shapes with little (<1/8") to no deviation from lines. Pt moves paper around in stead of hand and writing utensil. Pt traced ABC with minimal deviation and  handwriting of 2 sentences with approx 75% accuracy and legibility but reports and demonstration of improved coordination. Pt with distal finger control circles with decrease in coordination d/t fatigue and smaller and more precision required.                    OT Short Term Goals - 02/19/20 1748      OT SHORT TERM GOAL #1   Title Pt will be independent with HEP 01/29/20    Time 4    Period Weeks    Status Achieved    Target Date 01/29/20      OT SHORT TERM GOAL #2   Title Pt will perform physical and cognitive task simultaneously with 90% accuracy    Time 4    Period Weeks    Status On-going   mod/max increased time with subtraction of 3's while walking and carrying cup with approx 50%     OT SHORT TERM GOAL #3   Title Pt will perform a meal consisting of at least 3 steps with supervision, adapted strategies and good safety awareness.    Time 4    Period Weeks    Status On-going   pt  made eggs with increased time and supervision for sequencing and finding items     OT SHORT TERM GOAL #4   Title Pt will improve 9 hole peg test score by 5 seconds bilaterally in order to increase fine motor coordination in BUE.    Baseline RUE 87.16 seconds; LUE 70.38 seconds    Time 4    Period Weeks    Status On-going   LUE 51.59, RUE 84.47     OT SHORT TERM GOAL #5   Title Pt will complete modreately complex visual perceptual task with min assistance..    Time 4    Period Weeks    Status Achieved      OT SHORT TERM GOAL #6   Title Pt will navigate environment while carrying a plate of food or cup of liquid in either UE with minimal spills or drops in order to increase coordination.    Time 4    Period Weeks    Status Achieved      OT SHORT TERM GOAL #7   Title Pt will demonstrate ability to hold and carry a styrofoam cup with mod I for modulation of load force with BUE.    Time 4    Period Weeks    Status Achieved             OT Long Term Goals - 02/19/20 1752       OT LONG TERM GOAL #1   Title Pt will be independent with updated HEP 02/26/20    Time 8    Period Weeks    Status On-going      OT LONG TERM GOAL #2   Title Pt will write 3-5 sentences with 100% legibility in appropriate time in order to increase speed for handwritten tasks.    Time 8    Period Weeks    Status New      OT LONG TERM GOAL #3   Title Pt will perform a meal consisting of at least 3 steps with mod I, adapted strategies and good safety awareness.    Time 8    Period Weeks    Status On-going      OT LONG TERM GOAL #4   Title Pt will improve 9 hole peg test score by 10 seconds bilaterally in order to increase fine motor coordination in BUE.    Baseline RUE 87.16 seocnds, LUE 70.38 seconds    Time 8    Period Weeks    Status On-going   LUE 51.59, RUE 84.47     OT LONG TERM GOAL #5   Title Pt will navigate environment while carrying a plate of food or cup of liquid in either UE with no spills or drops in order to increase coordination.    Time 8    Period Weeks    Status Achieved      OT LONG TERM GOAL #6   Title Pt will demonstrate ability to carry a 20 lb object with appropriate modulation with grip in order to prepare for return to holding and carrying child.    Time 8    Period Weeks    Status On-going   carried 15 lb kettlebell     OT LONG TERM GOAL #7   Title Pt will complete work simulated tasks with 90% accuracy.    Time 8    Period Weeks    Status On-going   simulated work tasks this day     OT LONG TERM GOAL #8  Title Pt will verbalize understanding of adapted strategies for completing basic ADLs and IADLs.    Time 8    Period Weeks    Status On-going                 Plan - 02/19/20 1807    Clinical Impression Statement Pt is making progress towards goals. Pt has improved significantly with LUE fine motor coordination as evidenced by 20 second improvement with 9 hole peg test. Pt with improved coordintaion with tracing this day.    OT  Occupational Profile and History Detailed Assessment- Review of Records and additional review of physical, cognitive, psychosocial history related to current functional performance    Occupational performance deficits (Please refer to evaluation for details): IADL's;ADL's;Work;Leisure    Body Structure / Function / Physical Skills ADL;Decreased knowledge of use of DME;Balance;Dexterity;GMC;Gait;Strength;UE functional use;Proprioception;IADL;ROM;Vestibular;Vision;Coordination;Flexibility;Mobility;Sensation;FMC    Cognitive Skills Attention;Problem Solve;Safety Awareness;Perception;Understand;Learn    Rehab Potential Good    Clinical Decision Making Limited treatment options, no task modification necessary    Comorbidities Affecting Occupational Performance: May have comorbidities impacting occupational performance    Modification or Assistance to Complete Evaluation  No modification of tasks or assist necessary to complete eval    OT Frequency 2x / week    OT Duration 8 weeks    OT Treatment/Interventions Self-care/ADL training;DME and/or AE instruction;Balance training;Therapeutic activities;Therapeutic exercise;Energy conservation;Neuromuscular education;Patient/family education;Visual/perceptual remediation/compensation;Functional Mobility Training;Cognitive remediation/compensation    Plan UE coordination, visual perceptual tasks    Consulted and Agree with Plan of Care Patient           Patient will benefit from skilled therapeutic intervention in order to improve the following deficits and impairments:   Body Structure / Function / Physical Skills: ADL,Decreased knowledge of use of DME,Balance,Dexterity,GMC,Gait,Strength,UE functional use,Proprioception,IADL,ROM,Vestibular,Vision,Coordination,Flexibility,Mobility,Sensation,FMC Cognitive Skills: Attention,Problem Solve,Safety Awareness,Perception,Understand,Learn     Visit Diagnosis: Other lack of coordination  Muscle weakness  (generalized)  Frontal lobe and executive function deficit  Visuospatial deficit  Attention and concentration deficit  Unsteadiness on feet    Problem List Patient Active Problem List   Diagnosis Date Noted  . Anterior dislocation of right shoulder 03/01/2016    Junious Dresser MOT, OTR/L  02/19/2020, 6:23 PM  [facility] [address] [place], [place], [address] Phone: [phone]   Fax:  [phone]  Name: Carrel Leather MRN: [id number] Date of Birth: 1991/02/02

## 2020-02-23 ENCOUNTER — Ambulatory Visit: Payer: Commercial Managed Care - PPO | Admitting: Occupational Therapy

## 2020-02-23 ENCOUNTER — Other Ambulatory Visit: Payer: Self-pay

## 2020-02-23 ENCOUNTER — Encounter: Payer: Self-pay | Admitting: Occupational Therapy

## 2020-02-23 DIAGNOSIS — R41842 Visuospatial deficit: Secondary | ICD-10-CM

## 2020-02-23 DIAGNOSIS — R278 Other lack of coordination: Secondary | ICD-10-CM | POA: Diagnosis not present

## 2020-02-23 DIAGNOSIS — R41844 Frontal lobe and executive function deficit: Secondary | ICD-10-CM

## 2020-02-23 DIAGNOSIS — M6281 Muscle weakness (generalized): Secondary | ICD-10-CM

## 2020-02-23 DIAGNOSIS — R2681 Unsteadiness on feet: Secondary | ICD-10-CM

## 2020-02-23 DIAGNOSIS — R41840 Attention and concentration deficit: Secondary | ICD-10-CM

## 2020-02-23 NOTE — Therapy (Signed)
[facility] [address] [place], [place], [address] Phone: [phone]   Fax:  [phone]  Occupational Therapy Treatment  Patient Details  Name: Samuel Stanley MRN: [id number] Date of Birth: Jan 29, 1991 Referring Provider (OT): Dr. Leitha Schuller   Encounter Date: 02/23/2020   OT End of Session - 02/23/20 1706    Visit Number 13    Number of Visits 17    Date for OT Re-Evaluation 02/27/20    Authorization Type [employer] $35 copay    Authorization Time Period VL: 20 OT (Week 8/8 - 12/13)    OT Start Time 1703    OT Stop Time 1745    OT Time Calculation (min) 42 min    Activity Tolerance Patient tolerated treatment well    Behavior During Therapy [facility] for tasks assessed/performed           Past Medical History:  Diagnosis Date  . Hypertension     Past Surgical History:  Procedure Laterality Date  . HERNIA REPAIR    . testicular torsion      There were no vitals filed for this visit.   Subjective Assessment - 02/23/20 1706    Subjective  Pt denies any pain or changes    Pertinent History Hypertension    Limitations Fall.    Patient Stated Goals "I didn't even know I had it" - After assessment pt said he wants to "get faster". "hold my son and stand up"    Currently in Pain? No/denies                        OT Treatments/Exercises (OP) - 02/23/20 1724      Cognitive Exercises   Attention Span Other cognitive/physical simultaneous task with ambulating and sequential holidays with increased time and max verbal cueing for identifying common holidays (i.e. mother's day, father's day, memorial day, Ambulance person and labor day)      Visual/Perceptual Exercises   Copy this Image Pegboard    Pegboard 100% accuracy - much improved since last time did this same pattern      Fine Motor Coordination (Hand/Wrist)   Fine Motor Coordination Small Pegboard    Small Pegboard w/ RUE with increased time and min  drops                    OT Short Term Goals - 02/23/20 1718      OT SHORT TERM GOAL #1   Title Pt will be independent with HEP 01/29/20    Time 4    Period Weeks    Status Achieved    Target Date 01/29/20      OT SHORT TERM GOAL #2   Title Pt will perform physical and cognitive task simultaneously with 90% accuracy    Time 4    Period Weeks    Status Not Met   mod/max increased time with subtraction of 3's while walking and carrying cup with approx 50%     OT SHORT TERM GOAL #3   Title Pt will perform a meal consisting of at least 3 steps with supervision, adapted strategies and good safety awareness.    Time 4    Period Weeks    Status Deferred   pt reports just using microwave at this time.     OT SHORT TERM GOAL #4   Title Pt will improve 9 hole peg test score by 5 seconds bilaterally in order to increase fine motor  coordination in BUE.    Baseline RUE 87.16 seconds; LUE 70.38 seconds    Time 4    Period Weeks    Status Partially Met   LUE 51.59, RUE 84.47     OT SHORT TERM GOAL #5   Title Pt will complete modreately complex visual perceptual task with min assistance..    Time 4    Period Weeks    Status Achieved      OT SHORT TERM GOAL #6   Title Pt will navigate environment while carrying a plate of food or cup of liquid in either UE with minimal spills or drops in order to increase coordination.    Time 4    Period Weeks    Status Achieved      OT SHORT TERM GOAL #7   Title Pt will demonstrate ability to hold and carry a styrofoam cup with mod I for modulation of load force with BUE.    Time 4    Period Weeks    Status Achieved             OT Long Term Goals - 02/23/20 1719      OT LONG TERM GOAL #1   Title Pt will be independent with updated HEP 02/26/20    Time 8    Period Weeks    Status On-going      OT LONG TERM GOAL #2   Title Pt will write 3-5 sentences with 100% legibility in appropriate time in order to increase speed for  handwritten tasks.    Time 8    Period Weeks    Status New      OT LONG TERM GOAL #3   Title Pt will perform a meal consisting of at least 3 steps with mod I, adapted strategies and good safety awareness.    Time 8    Period Weeks    Status Deferred   pt reports only doing microwaving or heating up items on the stove     OT LONG TERM GOAL #4   Title Pt will improve 9 hole peg test score by 10 seconds bilaterally in order to increase fine motor coordination in BUE.    Baseline RUE 87.16 seocnds, LUE 70.38 seconds    Time 8    Period Weeks    Status Partially Met   LUE 51.59, RUE 84.47     OT LONG TERM GOAL #5   Title Pt will navigate environment while carrying a plate of food or cup of liquid in either UE with no spills or drops in order to increase coordination.    Time 8    Period Weeks    Status Achieved      OT LONG TERM GOAL #6   Title Pt will demonstrate ability to carry a 20 lb object with appropriate modulation with grip in order to prepare for return to holding and carrying child.    Time 8    Period Weeks    Status Achieved      OT LONG TERM GOAL #7   Title Pt will complete work simulated tasks with 90% accuracy.    Time 8    Period Weeks    Status Achieved      OT LONG TERM GOAL #8   Title Pt will verbalize understanding of adapted strategies for completing basic ADLs and IADLs.    Time 8    Period Weeks    Status Achieved                   Plan - 02/23/20 1717    Clinical Impression Statement Pt continues to have difficulty with phyical and cognitive simultaneous tasks.    OT Occupational Profile and History Detailed Assessment- Review of Records and additional review of physical, cognitive, psychosocial history related to current functional performance    Occupational performance deficits (Please refer to evaluation for details): IADL's;ADL's;Work;Leisure    Body Structure / Function / Physical Skills ADL;Decreased knowledge of use of  DME;Balance;Dexterity;GMC;Gait;Strength;UE functional use;Proprioception;IADL;ROM;Vestibular;Vision;Coordination;Flexibility;Mobility;Sensation;FMC    Cognitive Skills Attention;Problem Solve;Safety Awareness;Perception;Understand;Learn    Rehab Potential Good    Clinical Decision Making Limited treatment options, no task modification necessary    Comorbidities Affecting Occupational Performance: May have comorbidities impacting occupational performance    Modification or Assistance to Complete Evaluation  No modification of tasks or assist necessary to complete eval    OT Frequency 2x / week    OT Duration 8 weeks    OT Treatment/Interventions Self-care/ADL training;DME and/or AE instruction;Balance training;Therapeutic activities;Therapeutic exercise;Energy conservation;Neuromuscular education;Patient/family education;Visual/perceptual remediation/compensation;Functional Mobility Training;Cognitive remediation/compensation    Plan check goals and plan to discharge next session    Consulted and Agree with Plan of Care Patient           Patient will benefit from skilled therapeutic intervention in order to improve the following deficits and impairments:   Body Structure / Function / Physical Skills: ADL,Decreased knowledge of use of DME,Balance,Dexterity,GMC,Gait,Strength,UE functional use,Proprioception,IADL,ROM,Vestibular,Vision,Coordination,Flexibility,Mobility,Sensation,FMC Cognitive Skills: Attention,Problem Solve,Safety Awareness,Perception,Understand,Learn     Visit Diagnosis: Other lack of coordination  Muscle weakness (generalized)  Frontal lobe and executive function deficit  Visuospatial deficit  Attention and concentration deficit  Unsteadiness on feet    Problem List Patient Active Problem List   Diagnosis Date Noted  . Anterior dislocation of right shoulder 03/01/2016     M  MOT, OTR/L  02/23/2020, 5:45 PM  [facility] [address] [place], [place], [address] Phone: [phone]   Fax:  [phone]  Name: Samuel Stanley MRN: [id number] Date of Birth: 06/27/1990 

## 2020-02-24 ENCOUNTER — Ambulatory Visit: Payer: Commercial Managed Care - PPO | Admitting: Occupational Therapy

## 2020-02-26 ENCOUNTER — Ambulatory Visit: Payer: Commercial Managed Care - PPO | Admitting: Occupational Therapy

## 2020-02-26 ENCOUNTER — Other Ambulatory Visit: Payer: Self-pay

## 2020-02-26 ENCOUNTER — Encounter: Payer: Self-pay | Admitting: Occupational Therapy

## 2020-02-26 DIAGNOSIS — R41840 Attention and concentration deficit: Secondary | ICD-10-CM

## 2020-02-26 DIAGNOSIS — R41842 Visuospatial deficit: Secondary | ICD-10-CM

## 2020-02-26 DIAGNOSIS — R2681 Unsteadiness on feet: Secondary | ICD-10-CM

## 2020-02-26 DIAGNOSIS — R278 Other lack of coordination: Secondary | ICD-10-CM

## 2020-02-26 DIAGNOSIS — M6281 Muscle weakness (generalized): Secondary | ICD-10-CM

## 2020-02-26 DIAGNOSIS — R41844 Frontal lobe and executive function deficit: Secondary | ICD-10-CM

## 2020-02-26 NOTE — Therapy (Signed)
[facility] [address] [place], [place], [address] Phone: [phone]   Fax:  [phone]  Occupational Therapy Treatment & Discharge  Patient Details  Name: Samuel Stanley MRN: [id number] Date of Birth: 1990-09-24 Referring Provider (OT): Dr. Leitha Schuller   Encounter Date: 02/26/2020   OT End of Session - 02/26/20 1618    Visit Number 14    Number of Visits 17    Date for OT Re-Evaluation 02/27/20    Authorization Type [employer] $35 copay    Authorization Time Period VL: 20 OT (Week 8/8 - 12/13)    OT Start Time 1618    OT Stop Time 1646    OT Time Calculation (min) 28 min    Activity Tolerance Patient tolerated treatment well    Behavior During Therapy [facility] for tasks assessed/performed           Past Medical History:  Diagnosis Date  . Hypertension     Past Surgical History:  Procedure Laterality Date  . HERNIA REPAIR    . testicular torsion      There were no vitals filed for this visit.   Subjective Assessment - 02/26/20 1619    Subjective  Pt denies any pain or changes. Pt reporting things are getting better and he has improved.    Pertinent History Hypertension    Limitations Fall.    Patient Stated Goals "I didn't even know I had it" - After assessment pt said he wants to "get faster". "hold my son and stand up"    Currently in Pain? No/denies           OCCUPATIONAL THERAPY DISCHARGE SUMMARY  Visits from Start of Care: 14   Current functional level related to goals / functional outcomes: Pt has met  6/7 STGs and 5/8 LTGs with significant progress with LUE coordination. Pt reports increased ability to change son's diaper and coordination and ease with assisting son with dressing. Pt has developed increased strategies and skills to complete ADLs and IADLs with greater independence and safety.    Remaining deficits: Continues to have deficits with coordination in BUE and overall ataxia     Education / Equipment: HEP and strategies  Plan: Patient agrees to discharge.  Patient goals were partially met. Patient is being discharged due to being pleased with the current functional level.  ?????                  OT Treatments/Exercises (OP) - 02/26/20 1621      ADLs   Functional Mobility ambulated with styrofoam cup with water for practice for functional mobility while carrying objects with no drops and no LOB      Cognitive Exercises   Attention Span Other cognitive/physical task wit subtracting 3's with increased time and approx 50% accuracy with amublating around gym      Fine Motor Coordination (Hand/Wrist)   Fine Motor Coordination Handwriting    Handwriting worked on legibility and writing a paragraph with LUE with increased legibility                    OT Short Term Goals - 02/26/20 1636      OT SHORT TERM GOAL #1   Title Pt will be independent with HEP 01/29/20    Time 4    Period Weeks    Status Achieved    Target Date 01/29/20      OT SHORT TERM GOAL #2   Title Pt will  perform physical and cognitive task simultaneously with 90% accuracy    Time 4    Period Weeks    Status Not Met   mod/max increased time with subtraction of 3's while walking and carrying cup with approx 50%     OT SHORT TERM GOAL #3   Title Pt will perform a meal consisting of at least 3 steps with supervision, adapted strategies and good safety awareness.    Time 4    Period Weeks    Status Deferred   pt reports just using microwave at this time.     OT SHORT TERM GOAL #4   Title Pt will improve 9 hole peg test score by 5 seconds bilaterally in order to increase fine motor coordination in BUE.    Baseline RUE 87.16 seconds; LUE 70.38 seconds    Time 4    Period Weeks    Status Achieved   LUE 51.59, RUE 80.25     OT SHORT TERM GOAL #5   Title Pt will complete modreately complex visual perceptual task with min assistance..    Time 4    Period Weeks     Status Achieved      OT SHORT TERM GOAL #6   Title Pt will navigate environment while carrying a plate of food or cup of liquid in either UE with minimal spills or drops in order to increase coordination.    Time 4    Period Weeks    Status Achieved      OT SHORT TERM GOAL #7   Title Pt will demonstrate ability to hold and carry a styrofoam cup with mod I for modulation of load force with BUE.    Time 4    Period Weeks    Status Achieved             OT Long Term Goals - 02/26/20 1620      OT LONG TERM GOAL #1   Title Pt will be independent with updated HEP 02/26/20    Time 8    Period Weeks    Status Achieved      OT LONG TERM GOAL #2   Title Pt will write 3-5 sentences with 100% legibility in appropriate time in order to increase speed for handwritten tasks.    Time 8    Period Weeks    Status Partially Met   much improved handwriting with coordination in LUE but not 100% legibility     OT LONG TERM GOAL #3   Title Pt will perform a meal consisting of at least 3 steps with mod I, adapted strategies and good safety awareness.    Time 8    Period Weeks    Status Deferred   pt reports only doing microwaving or heating up items on the stove     OT LONG TERM GOAL #4   Title Pt will improve 9 hole peg test score by 10 seconds bilaterally in order to increase fine motor coordination in BUE.    Baseline RUE 87.16 seocnds, LUE 70.38 seconds    Time 8    Period Weeks    Status Partially Met   LUE 51.59, RUE 80.25     OT LONG TERM GOAL #5   Title Pt will navigate environment while carrying a plate of food or cup of liquid in either UE with no spills or drops in order to increase coordination.    Time 8    Period Weeks    Status  Achieved      OT LONG TERM GOAL #6   Title Pt will demonstrate ability to carry a 20 lb object with appropriate modulation with grip in order to prepare for return to holding and carrying child.    Time 8    Period Weeks    Status Achieved       OT LONG TERM GOAL #7   Title Pt will complete work simulated tasks with 90% accuracy.    Time 8    Period Weeks    Status Achieved      OT LONG TERM GOAL #8   Title Pt will verbalize understanding of adapted strategies for completing basic ADLs and IADLs.    Time 8    Period Weeks    Status Achieved                 Plan - 02/26/20 1706    Clinical Impression Statement Pt has met  6/7 STGs and 5/8 LTGs with significant progress with LUE coordination. Pt reports increased ability to change son's diaper and coordination and ease with assisting son with dressing. Pt has developed increased strategies and skills to complete ADLs and IADLs with greater independence and safety.    OT Occupational Profile and History Detailed Assessment- Review of Records and additional review of physical, cognitive, psychosocial history related to current functional performance    Occupational performance deficits (Please refer to evaluation for details): IADL's;ADL's;Work;Leisure    Body Structure / Function / Physical Skills ADL;Decreased knowledge of use of DME;Balance;Dexterity;GMC;Gait;Strength;UE functional use;Proprioception;IADL;ROM;Vestibular;Vision;Coordination;Flexibility;Mobility;Sensation;FMC    Cognitive Skills Attention;Problem Solve;Safety Awareness;Perception;Understand;Learn    Rehab Potential Good    Clinical Decision Making Limited treatment options, no task modification necessary    Comorbidities Affecting Occupational Performance: May have comorbidities impacting occupational performance    Modification or Assistance to Complete Evaluation  No modification of tasks or assist necessary to complete eval    OT Frequency 2x / week    OT Duration 8 weeks    OT Treatment/Interventions Self-care/ADL training;DME and/or AE instruction;Balance training;Therapeutic activities;Therapeutic exercise;Energy conservation;Neuromuscular education;Patient/family education;Visual/perceptual  remediation/compensation;Functional Mobility Training;Cognitive remediation/compensation    Plan discharge    Consulted and Agree with Plan of Care Patient           Patient will benefit from skilled therapeutic intervention in order to improve the following deficits and impairments:   Body Structure / Function / Physical Skills: ADL,Decreased knowledge of use of DME,Balance,Dexterity,GMC,Gait,Strength,UE functional use,Proprioception,IADL,ROM,Vestibular,Vision,Coordination,Flexibility,Mobility,Sensation,FMC Cognitive Skills: Attention,Problem Solve,Safety Awareness,Perception,Understand,Learn     Visit Diagnosis: Other lack of coordination  Muscle weakness (generalized)  Frontal lobe and executive function deficit  Visuospatial deficit  Attention and concentration deficit  Unsteadiness on feet    Problem List Patient Active Problem List   Diagnosis Date Noted  . Anterior dislocation of right shoulder 03/01/2016    Zachery Conch MOT, OTR/L  02/26/2020, 5:06 PM  [facility] [address] [place], [place], [address] Phone: [phone]   Fax:  [phone]  Name: Samuel Stanley MRN: [id number] Date of Birth: 1990-10-01

## 2020-03-02 ENCOUNTER — Ambulatory Visit: Payer: Commercial Managed Care - PPO | Admitting: Occupational Therapy

## 2020-06-08 NOTE — Progress Notes (Deleted)
MEDICAL GENETICS NEW PATIENT EVALUATION  Patient name: Samuel Stanley DOB: 1991-03-07 Age: 30 y.o. MRN: [id number]  Referring Provider/Specialty: [name] / Family Medicine Date of Evaluation: 06/08/2020 Chief Complaint/Reason for Referral: Spinocerebellar ataxia type 17  HPI: Samuel Stanley is a 30 y.o. 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 He is accompanied by his *** at today's visit.  Samuel Stanley was in good health up until 2018 when at 30 years old, he started having issues with walking (wide gait, balance off) and talking (slurred speech). 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 Specifically, a comprehensive Ataxia panel through [facility] was performed 09/2019 and showed a variant of uncertain significance in the TBP gene. The TBP gene was found to have 44 and 39 CAG/CAA repeats. The 44 repeats was considered a "borderline" result and the 39 repeats was normal. He was referred to [facility], but due to a prolonged waitlist, has been unable to speak with a geneticist or genetic counselor regarding this result.  His father has since transferred care to [facility]. He sees them every ***. Most recent MRI of the brain is showing decreased volume of the cerebellum. His symptoms appear to be progressing. He requires a walker to ambulate and has a wide-based gait. He is receiving therapies (speech, physical). It is getting harder for him to interact with others due to speech difficulty but he is able to [facility] appropriately and therefore becomes easily frustrated when unable to adequately express his feelings and needs. This is causing anger outbursts requiring medication. He is still able to drive and works at [employer].  ***Additional children  I saw Samuel Stanley's then 72 month old son, Samuel Stanley, on 04/15/2020 which is how I came to meet this family and learn of the father's diagnosis. I advised not testing Samuel Stanley during childhood (unless symptomatic) as this is expected to be an adult onset condition and Samuel Stanley should have the choice on whether he wants this information as an adult. However, Samuel Stanley himself has not had formal genetic counseling regarding his diagnosis as above. Therefore, I offered to see Samuel Stanley in my clinic for this purpose given his time sensitive diagnosis.  Pregnancy/Birth History: Samuel Stanley was born to a then *** year old G***P*** -> *** mother. The pregnancy was conceived ***naturally and was uncomplicated/complicated by ***. There were ***no exposures and labs were ***normal. Ultrasounds were normal/abnormal***. Amniotic fluid levels were ***normal. Fetal activity was ***normal. Genetic testing performed during the pregnancy included***/No genetic testing was performed during the pregnancy***.  Samuel Stanley was born at Gestational Age: <None> gestation at [facility] via *** delivery. Apgar scores were ***/***. There were ***no complications. Birth weight No birth weight on file. (***%), birth length *** in/*** cm (***%), head circumference *** cm (***%). He did ***not require a NICU stay. He was discharged home *** days after birth. He ***passed the newborn screen, hearing test and congenital heart screen.  Past Medical History: Past Medical History:  Diagnosis Date  . Hypertension    Patient Active Problem List   Diagnosis Date Noted  . Anterior dislocation of right shoulder 03/01/2016    Past Surgical History:  Past Surgical History:  Procedure Laterality Date  . HERNIA REPAIR    . testicular torsion      Developmental History: Milestones -- ***  Therapies -- ***  Toilet training -- ***  School -- ***  Social History: Social History   Social History Narrative   Lives with wife in a one story home.  No children.  Getting ready to start a  new job.  Education: high school.    Caffeine - 1 soda / day   Gets exercise at work.    Medications: Current Outpatient Medications on File Prior to Visit  Medication Sig Dispense Refill  . acetaminophen (TYLENOL) 325 MG tablet Take 650 mg by mouth every 6 (six) hours as needed.    Marland Kitchen. ibuprofen (ADVIL) 400 MG tablet Take 400 mg by mouth every 6 (six) hours as needed.    . meclizine (ANTIVERT) 25 MG tablet TAKE 1 TABLET(25 MG) BY MOUTH THREE TIMES DAILY AS NEEDED FOR DIZZINESS 30 tablet 2  . sertraline (ZOLOFT) 50 MG tablet Take 50 mg by mouth daily.     No current facility-administered medications on file prior to visit.    Allergies:  No Known Allergies  Immunizations: ***up to date  Review of Systems: General: *** Eyes/vision: *** Ears/hearing: *** Dental: *** Respiratory: *** Cardiovascular: *** Gastrointestinal: *** Genitourinary: *** Endocrine: *** Hematologic: *** Immunologic: *** Neurological: *** Psychiatric: *** Musculoskeletal: *** Skin, Hair, Nails: ***  Family History: See pedigree below obtained during today's visit: ***  Notable family history: ***  Mother's ethnicity: *** Father's ethnicity: *** Consangunity: ***Denies  Physical Examination: Weight: *** (***%) Height: *** (***%) Head circumference: *** (***%)  There were no vitals taken for this visit.  General: ***Alert, interactive Head: ***Normocephalic Eyes: ***Normoset, ***Normal lids, lashes, brows, ICD *** cm, OCD *** cm, Calculated***/Measured*** IPD *** cm (***%) Nose: *** Lips/Mouth/Teeth: *** Ears: ***Normoset and normally formed, no pits, tags or creases Neck: ***Normal appearance Chest: ***No pectus deformities, nipples appear normally spaced and formed, IND *** cm, CC *** cm, IND/CC ratio *** (***%) Heart: ***Warm and well perfused Lungs: ***No increased work of breathing Abdomen: ***Soft, non-distended, no masses, no hepatosplenomegaly, no hernias Genitalia: *** Skin:  ***No axillary or inguinal freckling Hair: ***Normal anterior and posterior hairline, ***normal texture Neurologic: ***Normal gross motor by observation, no abnormal movements Psych: *** Back/spine: ***No scoliosis, ***no sacral dimple Extremities: ***Symmetric and proportionate Hands/Feet: ***Normal hands, fingers and nails, ***2 palmar creases bilaterally, ***Normal feet, toes and nails, ***No clinodactyly, syndactyly or polydactyly  ***Photos of patient in media tab (parental verbal consent obtained)  Prior Genetic testing:   Pertinent Labs: Prior work-up beginning in 04/2018 all unremarkable  Pertinent Imaging/Studies: MR Brain 04/2018: FINDINGS: Brain: Today's post-contrast exam again shows advanced cerebellar atrophy. Cerebral hemispheres do not show atrophy. No evidence of focal stroke. After contrast administration, no abnormal enhancement occurs. Arterial and venous structures appear patent.  IMPRESSION: Postcontrast examination today does not show any abnormal enhancement. Major vascular structures appear patent. Again seen is advanced but isolated cerebellar atrophy. In the absence of previous toxin exposure, consideration should be given to spinocerebellar atrophy syndromes.  MR Cervical spine 05/2018: Normal appearance of the cervical spinal cord. No sign of demyelinating disease, compression or focal lesion.  Small, non-compressive disc bulge at C5-6.  Advanced cerebellar atrophy for age, as previously described.  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 This diagnosis was made through a comprehensive ataxia panel through [facility] in 09/2019 which showed a variant of uncertain significance in the TBP gene. The TBP gene was found to have 44 and 39 CAG/CAA repeats. The 44 repeats was considered a "borderline" result and  the 39 repeats was normal. After reviewing the literature, I do think the 44 repeat copy is causative for  Samuel Stanley symptoms.  In reviewing the literature and GeneReviews regarding this specific condition:  Spinocerebellar ataxia type 17 (SCA17) is characterized by ataxia, dementia, and involuntary movements, including chorea and dystonia. Psychiatric symptoms, pyramidal signs, and rigidity are common. The age of onset ranges from three to 75 years (Mean 34.6 years). This is an autosomal dominant condition caused by a heterozygous pathogenic variant of CAG (and sometimes CAA) repeats in the TBP gene on chromosome 6. Ataxia and psychiatric abnormalities are frequently the initial findings, followed by involuntary movement, parkinsonism, dementia, and pyramidal signs. Brain MRI shows variable atrophy of the cerebrum, brain stem, and cerebellum. There is currently no preventative treatment or way to delay symptom onset. Treatment upon symptom onset includes supportive care; there is no curative treatment. The clinical features correlate with the length of the polyglutamine expansion but are not absolutely predictive of the clinical course. Information regarding timeline of progression and causes of mortality does not appear to be readily available or known. This is 1 paper citing a 20 year life expectancy following symptom onset (PMID: 72536644).   Regarding allele repeat size: -- 25-40: normal -- 41-48: reduced penetrance  An individual with an allele in this range may or may not develop symptoms. The significance of alleles of 41-44 repeats is particularly controversial because penetrance is estimated to be 50%, making genotype-phenotype correlations difficult. One symptomatic individual having 41 repeats and four symptomatic persons having 42 repeats have been reported [place] et al 2007, Nolte et al 2010].  The penetrance of alleles of 41-44 repeats is estimated at 50% and the penetrance of alleles of 45-48 repeats is estimated at greater than 80% [Toyoshima et al 2004].  More than 75% of individuals have  intellectual deterioration; in some individuals, intellectual problems and involuntary movements are the only signs. Psychiatric symptoms or dementia, parkinsonism, and chorea - a clinical constellation resembling Huntington disease - are more frequently observed in individuals with CAG/CAA repeats in this range than in individuals with larger repeats [Stevanin et al 2003, Bauer et al 2004, Toyoshima et al 2004].   -- 49+: full penetrance ? Individuals with full-penetrance alleles develop neurologic and/or psychiatric symptoms by age 44 years.  Repeat size and symptom onset:   The cases of childhood onset (very rare) were in those with 60+ repeats. Anticipation does not appear to occur (meaning significant increases in repeat expansion size from generation to generation and thus earlier symptom onset). Regarding reproductive risk, Parnell has a 50% chance of passing on the TBP gene with 44 repeats and a 50% chance of passing on the TBP gene is 39 repeats. There are preimplantation testing techniques to avoid having a child with 44 repeats or options to perform testing during a pregnancy; a prenatal genetic counseling referral would be best to hear about this in detail.  Although Joenathan's result of 44 repeats was classified as "borderline" by the testing laboratory, there have been reports of adults with 41-42 repeats who are fully symptomatic. There is suggestion in the literature/GeneReviews as above that the "borderline" number of repeats actually displays reduced penetrance and therefore not all individuals with this category of repeats shows symptoms. It is estimated that 50% of individuals with repeat numbers 41-44 show symptoms. It is unclear why some people are symptomatic while others are not, but there are likely modifying factors that are still being learned. I do feel Gustavo's  clinical symptoms and molecular testing result are consistent with SCA17.   Recommendations: 1. Continue close  follow-up with [facility] and therapies. Anticipatory guidance for natural history as above provided. 2. Prenatal genetic counseling for family to discuss future family planning ([place] [facility] genetic counselor, Joyce Gross, is an option) 3. I will see if I can connect the [place] family with other individuals with SCA17   Loletha Grayer, D.O. Attending Physician, Medical [facility] Date: 06/08/2020 Time: ***   Total time spent: *** Time spent includes face to face and non-face to face care for the patient on the date of this encounter (history and physical, genetic counseling, coordination of care, data gathering and/or documentation as outlined)

## 2020-06-09 ENCOUNTER — Encounter (INDEPENDENT_AMBULATORY_CARE_PROVIDER_SITE_OTHER): Payer: Self-pay

## 2020-06-09 ENCOUNTER — Ambulatory Visit (INDEPENDENT_AMBULATORY_CARE_PROVIDER_SITE_OTHER): Payer: Self-pay | Admitting: Pediatric Genetics

## 2020-06-21 NOTE — Progress Notes (Signed)
MEDICAL GENETICS NEW PATIENT EVALUATION  Patient name: Samuel Stanley DOB: [birth year] Age: 30 y.o. MRN: [id number]  Referring Provider/Specialty: Dr. Donzetta Sprungerry Daniel / Family Medicine Date of Evaluation: 06/24/2020 Chief Complaint/Reason for Referral: Spinocerebellar ataxia type 17  HPI: Samuel BobJustin Driver is a 30 y.o. 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 He is accompanied by his wife, Samuel Stanley, at today's visit.  Samuel Stanley was in good health up until 2018 when at 30 years old, he started having issues with walking (wide gait, balance off) and talking (slurred speech). He was evaluated by Neurology locally in [place] and eventually had genetic testing done that provided a possible diagnosis of Spinocerebellar ataxia type 17. Specifically, a Comprehensive Ataxia panel through [facility] was performed 09/2019 and showed a variant of uncertain significance in the TBP gene. The TBP gene was found to have 44 and 39 CAG/CAA repeats. The 44 repeats was considered a "borderline" result and the 39 repeats was normal. [facility], but due to a prolonged waitlist, he has been unable to speak with a geneticist or genetic counselor regarding this result.  His father has since transferred care to [facility] (Dr. Lesia SagoKyle Mitchell). He last saw them March 2022 and will follow-up again October 2022. Most recent MRI of the brain is showing decreased volume of the cerebellum. His symptoms appear to be progressing overall but seem to have stabilized over the last 5 months. He often requires a walker to ambulate and has a wide-based gait. He was receiving therapies (speech, physical) but this ended in December 2021. It is getting harder for him to interact with others due to speech difficulty but he is [facility] to [facility] and therefore becomes easily frustrated when unable to adequately express his feelings and needs. This is causing  anger outbursts requiring medication. He did express to me that he feels afraid sometimes that he will hurt his wife or son emotionally based on his anger. He did show me a phone app where he can type things out and it will speak for him.   He is still able to drive. 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 He has interviewed for other jobs without luck. They plan to file for disability soon.  Regarding additional children, Samuel Stanley and his wife did express they are interested in having future children perhaps in a few years.  I saw Yusif's then 9014 month old son, Sula RumpleGreyson Stanley, on 04/15/2020 which is how I came to meet this family and learn of the father's diagnosis. I advised not testing Samuel during childhood (unless symptomatic) as this is expected to be an adult onset condition and Samuel Stanley should have the choice on whether he wants this information as an adult. However, Samuel Stanley himself has not had formal genetic counseling regarding his genetic test result and diagnosis as above. Therefore, I offered to see Samuel Stanley in my clinic for this purpose given his time sensitive diagnosis.  Past Medical History: Past Medical History:  Diagnosis Date  . Hypertension    Patient Active Problem List   Diagnosis Date Noted  . Anterior dislocation of right shoulder 03/01/2016    Past Surgical History:  Past Surgical History:  Procedure Laterality Date  . HERNIA REPAIR    . testicular torsion     Social History: Social History   Social History Narrative   Lives with  wife in a one story home.  No children.  Getting ready to start a new job.  Education: high school.    Caffeine - 1 soda / day   Gets exercise at work.    Medications: Current Outpatient Medications on File Prior to Visit  Medication Sig Dispense Refill  . ibuprofen (ADVIL) 400 MG tablet Take 400 mg by mouth every 6 (six) hours as needed.    . meclizine  (ANTIVERT) 25 MG tablet TAKE 1 TABLET(25 MG) BY MOUTH THREE TIMES DAILY AS NEEDED FOR DIZZINESS 30 tablet 2  . sertraline (ZOLOFT) 50 MG tablet Take 50 mg by mouth daily.    Marland Kitchen acetaminophen (TYLENOL) 325 MG tablet Take 650 mg by mouth every 6 (six) hours as needed.     No current facility-administered medications on file prior to visit.    Allergies:  No Known Allergies  Immunizations: Up to date  Review of Systems: General: no concerns Eyes/vision: wears glasses Ears/hearing: no concerns Dental: no concerns Respiratory: no concerns Cardiovascular: no concerns Gastrointestinal: no feeding difficulty Genitourinary: no concerns Endocrine: no concerns Hematologic: no concerns Immunologic: no concerns Neurological: ataxia, speech difficulty due to spinocerebellar ataxia type 17 Psychiatric: anger, frustration Musculoskeletal: no concerns Skin, Hair, Nails: no concerns  Family History: See pedigree below obtained during his son Samuel's visit (son is proband in this pedigree):    Notable family history:  Samuel Stanley has 1 child Samuel Stanley). He and his wife have expressed interest in additional children in the future.  His father is 59 years old and has high blood pressure. He does not have any symptoms of spinocerebellar ataxia. Samuel Stanley has a paternal half-sister who is 61 years old and healthy. She does not have children. Samuel Stanley paternal grandparents are in good health.   He is not in contact with his mother and little is known about her history. He does have a maternal half-sister who is 11 years old and little is known about her health. She has 3 children.  No family members have known symptoms of spinocerebellar ataxia nor genetic testing (but maternal side unknown).  Mother's ethnicity: Caucasian Father's ethnicity: Caucasian Consangunity: Denies  Physical Examination: Weight: 110 kg  Height: 5'11"  Head circumference: 59.1 cm   Ht 5' 11.26" (1.81 m) Comment: pt was  wearing shoes  Wt 242 lb 6.4 oz (110 kg)   HC 59.1 cm (23.25")   BMI 33.56 kg/m   General: Alert, interactive but slow to respond and often looks to wife to assist with answering Head: Normocephalic Eyes: Normoset, Normal lids, lashes, brows, wearing glasses Nose: Normal appearance Lips/Mouth/Teeth: Normal appearance Ears: Normoset, somewhat prominent and normally formed, no pits, tags or creases Neck: Normal appearance Heart: Warm and well perfused Lungs: No increased work of breathing Neurologic: Difficulty with gait, able to ambulate independently but slow, wide based gait; able to sit and stand from chair independently; speech was slurred, slow and often difficult for me to understand but his comprehension was appropriate and his attempts to answer my questions were appropriate Psych: normal affect Extremities: Symmetric and proportionate  Prior Genetic testing:   Pertinent Labs: Prior work-up beginning in 04/2018 all unremarkable  Pertinent Imaging/Studies: MR Brain 04/2018: FINDINGS: Brain: Today's post-contrast exam again shows advanced cerebellar atrophy. Cerebral hemispheres do not show atrophy. No evidence of focal stroke. After contrast administration, no abnormal enhancement occurs. Arterial and venous structures appear patent.  IMPRESSION: Postcontrast examination today does not show any abnormal enhancement. Major vascular structures appear patent. Again seen  is advanced but isolated cerebellar atrophy. In the absence of previous toxin exposure, consideration should be given to spinocerebellar atrophy syndromes.  MR Cervical spine 05/2018: Normal appearance of the cervical spinal cord. No sign of demyelinating disease, compression or focal lesion.  Small, non-compressive disc bulge at C5-6.  Advanced cerebellar atrophy for age, as previously described.  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 This diagnosis was made  through a comprehensive ataxia panel through [facility] in 09/2019 which showed a variant of uncertain significance in the TBP gene. The TBP gene was found to have 44 and 39 CAG/CAA repeats. The 44 repeats was considered a "borderline" result and the 39 repeats was normal. After reviewing the literature, I do think the 44 repeat copy is causative for Samuel Stanley's symptoms and I do believe Samuel Stanley diagnosis is spinocerebellar ataxia type 17.  In reviewing the literature and GeneReviews regarding this specific condition:  Spinocerebellar ataxia type 17 (SCA17) is characterized by ataxia, dementia, and involuntary movements, including chorea and dystonia. Psychiatric symptoms, pyramidal signs, and rigidity are common. The age of onset ranges from three to 75 years (mean 34.6 years). This is an autosomal dominant condition caused by a heterozygous pathogenic variant of CAG (and sometimes CAA) repeats in the TBP gene on chromosome 6. Ataxia and psychiatric abnormalities are frequently the initial findings, followed by involuntary movement, parkinsonism, dementia, and pyramidal signs. Seizures may also occur. Brain MRI shows variable atrophy of the cerebrum, brain stem, and cerebellum. There is currently no preventative or curative treatment nor way to delay symptom onset. Treatment upon symptom onset includes supportive care.   The clinical features correlate with the length of the polyglutamine expansion but are not absolutely predictive of the clinical course. Information regarding timeline of progression and causes of mortality does not appear to be readily available or known. This is 1 paper citing a 20 year life expectancy following symptom onset (PMID: 12458099).   Regarding allele repeat size: -- 25-40: normal -- 41-48: reduced penetrance  An individual with an allele in this range may or may not develop symptoms. The significance of alleles of 41-44 repeats is particularly controversial because penetrance  is estimated to be 50%, making genotype-phenotype correlations difficult. One symptomatic individual having 41 repeats and four symptomatic persons having 42 repeats have been reported [place] et al 2007, Nolte et al 2010].  The penetrance of alleles of 41-44 repeats is estimated at 50% and the penetrance of alleles of 45-48 repeats is estimated at greater than 80% [Toyoshima et al 2004].  More than 75% of individuals have intellectual deterioration; in some individuals, intellectual problems and involuntary movements are the only signs. Psychiatric symptoms or dementia, parkinsonism, and chorea - a clinical constellation resembling Huntington disease - are more frequently observed in individuals with CAG/CAA repeats in this range than in individuals with larger repeats [Stevanin et al 2003, Bauer et al 2004, Toyoshima et al 2004].   -- 49+: full penetrance ? Individuals with full-penetrance alleles develop neurologic and/or psychiatric symptoms by age 42 years.  Repeat size and symptom onset:   The cases of childhood onset (very rare) were in those with 60+ repeats. Anticipation does not appear to occur (meaning significant increases in repeat expansion size from generation to generation and thus earlier symptom onset). Regarding reproductive risk, [place] has a 50% chance of passing on the TBP gene with 44 repeats and a 50% chance of passing on the TBP gene is 39 repeats to each  child. There are preimplantation testing techniques to avoid having a child with 44 repeats or options to perform testing during a pregnancy; a prenatal genetic counseling referral would be best to hear about this in detail.  Although [place]'s result of 44 repeats was classified as "borderline" by the testing laboratory, there have been reports of adults with 41-42 repeats who are fully symptomatic. There is suggestion in the literature/GeneReviews as above that the "borderline" number of repeats actually displays reduced  penetrance and therefore not all individuals with this category of repeats shows symptoms. It is estimated that 50% of individuals with repeat numbers 41-44 show symptoms. It is unclear why some people are symptomatic while others are not, but there are likely modifying factors that are still being learned. I do feel Samuel Stanley's clinical symptoms and molecular testing result are consistent with SCA17.   It is likely that he inherited the 64 repeat from either his mother or father. His father is not symptomatic (but still possible that he has the 44 repeats since there is reduced penetrance). He is not in contact with his mother so it is also possible that she may be symptomatic. He does have half siblings through both his mother and father. Genetic testing is available for any family members if they wish to know this information with the understanding that reduced penetrance exists (not all individuals with 44 repeats will have symptoms, even within the same family) or if they become symptomatic.  Recommendations: 1. Continue close follow-up with PCP, [facility] and implement therapies as needed. Anticipatory guidance for natural history as above provided.  2. Prenatal genetic counseling for family to discuss future family planning ([facility] genetic counselor, Joyce Gross, is an option)  3. I will see if I can connect the [place] family with other individuals with SCA17. I provided them with the GeneReviews paper and also encouraged them to join online spinocerebellar ataxia groups.   Loletha Grayer, D.O. Attending Physician, Medical [facility] Date: 06/28/2020 Time: 10:43am   Total time spent: 60 minutes Time spent includes face to face and non-face to face care for the patient on the date of this encounter (history and physical, genetic counseling, coordination of care, data gathering and/or documentation as outlined)

## 2020-06-24 ENCOUNTER — Encounter (INDEPENDENT_AMBULATORY_CARE_PROVIDER_SITE_OTHER): Payer: Self-pay | Admitting: Pediatric Genetics

## 2020-06-24 ENCOUNTER — Ambulatory Visit (INDEPENDENT_AMBULATORY_CARE_PROVIDER_SITE_OTHER): Payer: Commercial Managed Care - PPO | Admitting: Pediatric Genetics

## 2020-06-24 ENCOUNTER — Other Ambulatory Visit: Payer: Self-pay

## 2020-06-24 VITALS — Ht 71.26 in | Wt 242.4 lb

## 2020-06-24 DIAGNOSIS — G1119 Other early-onset cerebellar ataxia: Secondary | ICD-10-CM

## 2020-06-24 DIAGNOSIS — R898 Other abnormal findings in specimens from other organs, systems and tissues: Secondary | ICD-10-CM

## 2020-06-24 DIAGNOSIS — Z7183 Encounter for nonprocreative genetic counseling: Secondary | ICD-10-CM | POA: Diagnosis not present

## 2020-09-20 DIAGNOSIS — F331 Major depressive disorder, recurrent, moderate: Secondary | ICD-10-CM | POA: Diagnosis not present

## 2020-09-20 DIAGNOSIS — Z6833 Body mass index (BMI) 33.0-33.9, adult: Secondary | ICD-10-CM | POA: Diagnosis not present

## 2020-09-20 DIAGNOSIS — G319 Degenerative disease of nervous system, unspecified: Secondary | ICD-10-CM | POA: Diagnosis not present

## 2020-10-21 ENCOUNTER — Other Ambulatory Visit (HOSPITAL_COMMUNITY): Payer: Self-pay

## 2020-10-21 MED ORDER — SERTRALINE HCL 50 MG PO TABS
50.0000 mg | ORAL_TABLET | Freq: Every day | ORAL | 0 refills | Status: DC
  Filled 2020-10-21: qty 60, 60d supply, fill #0

## 2020-12-13 ENCOUNTER — Other Ambulatory Visit (HOSPITAL_COMMUNITY): Payer: Self-pay

## 2020-12-13 MED ORDER — SERTRALINE HCL 50 MG PO TABS
50.0000 mg | ORAL_TABLET | Freq: Every day | ORAL | 0 refills | Status: DC
  Filled 2020-12-13: qty 90, 90d supply, fill #0

## 2021-01-20 DIAGNOSIS — G119 Hereditary ataxia, unspecified: Secondary | ICD-10-CM | POA: Diagnosis not present

## 2021-03-11 ENCOUNTER — Other Ambulatory Visit: Payer: Self-pay

## 2021-03-11 ENCOUNTER — Ambulatory Visit: Payer: Self-pay | Attending: Neurology

## 2021-03-11 VITALS — BP 130/86

## 2021-03-11 DIAGNOSIS — M6281 Muscle weakness (generalized): Secondary | ICD-10-CM | POA: Insufficient documentation

## 2021-03-11 DIAGNOSIS — R2681 Unsteadiness on feet: Secondary | ICD-10-CM | POA: Insufficient documentation

## 2021-03-11 DIAGNOSIS — R278 Other lack of coordination: Secondary | ICD-10-CM | POA: Insufficient documentation

## 2021-03-11 DIAGNOSIS — R2689 Other abnormalities of gait and mobility: Secondary | ICD-10-CM | POA: Insufficient documentation

## 2021-03-11 NOTE — Therapy (Signed)
[facility] [address] [place], [place], [address] Phone: [phone]   Fax:  [phone]  Physical Therapy Evaluation  Patient Details  Name: Samuel Stanley MRN: [id number] Date of Birth: [birth year] Referring Provider (PT): [name], MD (([facility]))   Encounter Date: 03/11/2021   PT End of Session - 03/11/21 1354     Visit Number 1    Number of Visits 13    Date for PT Re-Evaluation 04/29/21    Authorization Type UMR    PT Start Time 1356    PT Stop Time 1439    PT Time Calculation (min) 43 min    Equipment Utilized During Treatment Gait belt    Activity Tolerance Patient tolerated treatment well    Behavior During Therapy [facility] for tasks assessed/performed             Past Medical History:  Diagnosis Date   Hypertension     Past Surgical History:  Procedure Laterality Date   HERNIA REPAIR     testicular torsion      Vitals:   03/11/21 1410  BP: 130/86      Subjective Assessment - 03/11/21 1355     Subjective Patient reports has been doing well. Reports that continues to have challenge with the balance, has noticed that his stance is getting wider. Reports he is using a walker at times, reports intermittent use out in community. Is not using an AD in the house. No falls over the past year. Does not work anymore, recieved disability. But reports he has been able to continue to drive. Has started B12 shots due to this being low. Reports he wants to focus on walking and standing balance.    Pertinent History HTN, Cerebellar Ataxia    Limitations Walking;Standing;House hold activities;Lifting    Diagnostic tests MRI brain from 03/2018 reportedly showed severe cerebellar atrophy    Patient Stated Goals Improve the walking and balance    Currently in Pain? No/denies                [facility] PT Assessment - 03/11/21 0001       Assessment   Medical Diagnosis Ataxia    Referring Provider (PT)  [name], MD   ([facility])   Onset Date/Surgical Date 02/24/21   referral date   Hand Dominance Left    Prior Therapy Prior PT/OT at this OP Neuro Rehab      Precautions   Precautions Fall;Other (comment)    Precaution Comments HTN, Cerebellar Ataxia      Restrictions   Weight Bearing Restrictions No      Balance Screen   Has the patient fallen in the past 6 months No    Has the patient had a decrease in activity level because of a fear of falling?  No    Is the patient reluctant to leave their home because of a fear of falling?  No      Home Environment   Living Environment Private residence    Living Arrangements Spouse/significant other    Available Help at Discharge Family    Type of Home House    Home Access Stairs to enter    Entrance Stairs-Number of Steps 1    Entrance Stairs-Rails None    Home Layout One level    Home Equipment Walker - 4 wheels    Additional Comments intermittent use of rollator      Prior Function   Level of Independence Independent  with household mobility without device;Independent with community mobility with device    Vocation On disability    Vocation Requirements recently approved for disability per patient reports      Cognition   Overall Cognitive Status Within Functional Limits for tasks assessed      Sensation   Light Touch Appears Intact    Additional Comments for BLE's      Coordination   Gross Motor Movements are Fluid and Coordinated No    Fine Motor Movements are Fluid and Coordinated No    Coordination and Movement Description Generally uncoordinated in BLE.    Finger Nose Finger Test Impaired. More challenge noted with RUE > LLE.    Heel Shin Test Impaired      ROM / Strength   AROM / PROM / Strength Strength      Strength   Overall Strength Deficits    Overall Strength Comments all completed from seated position.    Strength Assessment Site Hip;Knee;Ankle    Right/Left Hip Right;Left    Right Hip Flexion 4-/5     Left Hip Flexion 4-/5    Right/Left Knee Right;Left    Right Knee Flexion 4-/5    Right Knee Extension 4/5    Left Knee Flexion 4-/5    Left Knee Extension 4/5    Right/Left Ankle Left;Right    Right Ankle Dorsiflexion 4-/5    Left Ankle Dorsiflexion 4-/5      Transfers   Transfers Sit to Stand;Stand to Sit    Sit to Stand 5: Supervision;4: Min guard    Five time sit to stand comments  26.10 secs with UE support from standard chair, heavy extension upon standing push BLE into chair. CGA/supervision    Stand to Sit 5: Supervision;4: Min guard      Ambulation/Gait   Ambulation/Gait Yes    Ambulation/Gait Assistance 5: Supervision;4: Min guard    Ambulation/Gait Assistance Details CGA/supervision with ambulation, patient demo wide BOS and increased challenge with turns with ambulation.    Ambulation Distance (Feet) 115 Feet    Assistive device None    Gait Pattern Ataxic;Wide base of support;Decreased step length - right;Decreased step length - left;Decreased stance time - right;Decreased stance time - left    Ambulation Surface Level;Indoor    Gait velocity 14.85 secs = 2.21 ft/sec    Stairs Yes    Stairs Assistance 5: Supervision    Stairs Assistance Details (indicate cue type and reason) ascend/descend with bilat rails and reciprocal pattern    Stair Management Technique Alternating pattern;Two rails;Forwards    Number of Stairs 4    Height of Stairs 6      Standardized Balance Assessment   Standardized Balance Assessment Timed Up and Go Test;Berg Balance Test      Berg Balance Test   Sit to Stand Able to stand  independently using hands    Standing Unsupported Able to stand 2 minutes with supervision    Sitting with Back Unsupported but Feet Supported on Floor or Stool Able to sit safely and securely 2 minutes    Stand to Sit Uses backs of legs against chair to control descent    Transfers Able to transfer safely, definite need of hands    Standing Unsupported with Eyes  Closed Able to stand 10 seconds with supervision    Standing Unsupported with Feet Together Able to place feet together independently and stand for 1 minute with supervision    From Standing, Reach Forward with Outstretched Arm  Can reach forward >12 cm safely (5")    From Standing Position, Pick up Object from Floor Able to pick up shoe, needs supervision    From Standing Position, Turn to Look Behind Over each Shoulder Turn sideways only but maintains balance    Turn 360 Degrees Able to turn 360 degrees safely but slowly    Standing Unsupported, Alternately Place Feet on Step/Stool Needs assistance to keep from falling or unable to try    Standing Unsupported, One Foot in Front Needs help to step but can hold 15 seconds    Standing on One Leg Tries to lift leg/unable to hold 3 seconds but remains standing independently    Total Score 33    Berg comment: 33/56 = High Fall Risk      Timed Up and Go Test   TUG Normal TUG    Normal TUG (seconds) 21.08    TUG Comments without AD, close supervision/CGA                        Objective measurements completed on examination: See above findings.                PT Education - 03/11/21 1402     Education Details Educated on [employer]) Educated Patient    Methods Explanation    Comprehension Verbalized understanding              PT Short Term Goals - 03/11/21 1447       PT SHORT TERM GOAL #1   Title Patient will be independent with initial HEP for balance/strength (ALL STGs Due: 04/08/21)    Baseline HEP established at prior POC    Time 3    Period Weeks    Status New    Target Date 04/08/21      PT SHORT TERM GOAL #2   Title Patient will improve Berg Balance to >/= 35/56 to demo improved balance    Baseline 33/56    Time 3    Period Weeks    Status New      PT SHORT TERM GOAL #3   Title Patient will demo ability to complete 5x sit <> stand in </= 22 seconds with UE support to  demo improved functional strength/mobility    Baseline 26.10 secs with UE support    Time 3    Period Weeks    Status New      PT SHORT TERM GOAL #4   Title Patient will demo ability to complete TUG </= 18 seconds to demonstrate reduced fall risk    Baseline 21.08 secs without AD    Time 3    Period Weeks    Status New               PT Long Term Goals - 03/11/21 1449       PT LONG TERM GOAL #1   Title Patient will be independent with final HEP for balance/strengthening (ALL LTGs Due: 04/29/21)    Baseline HEP established at prior POC    Time 6    Period Weeks    Status New    Target Date 04/29/21      PT LONG TERM GOAL #2   Title Pt will increase Berg score >/= 40/56 to demonstrate improved balance and decreased fall risk.    Baseline 33/56    Time 6    Period Weeks    Status New  PT LONG TERM GOAL #3   Title Patient will improve TUG to </= 15 secs with no AD vs. LRAD to demo improved balance    Baseline 21.08 secs    Time 6    Period Weeks    Status New      PT LONG TERM GOAL #4   Title Patient will improve 5x sit<> stand to </= 18 seconds with UE to demo improved functional mobility    Baseline 26.10 secs with UE support    Time 6    Period Weeks    Status New      PT LONG TERM GOAL #5   Title Patient will improve gait speed to >/= 2.5 ft/sec with LRAD to demo improved community ambulation    Baseline 2.21 ft/sec    Time 6    Period Weeks    Status New                    Plan - 03/11/21 1355     Clinical Impression Statement Patient is a 30 y.o. male referred to Neuro OPPT services for Ataxia. Patient's PMH significant for the following: HTN, Cerebellar Ataxia. Patient presents with the following impairments: abnormal gait, impaired balance, impaired gross and fine motor coordination, muscle weakness, impaired postural contorl, and increased fall risk. Patient ambulating into session without AD, at gait speed of 2.21 ft/sec indicating  limited community ambulator. Patient is at high risk for falls with Sharlene Motts Balance score of 33/56, TUG of 21.08 secs, and 5x sit <> stand of 26.10 seconds. Patient will benefit from skilled PT services to address impairments, improved mobility/coordination and reduce fall risk.    Personal Factors and Comorbidities Comorbidity 2;Time since onset of injury/illness/exacerbation    Comorbidities HTN, Cerebellar Ataxia    Examination-Activity Limitations Transfers;Stairs;Stand;Locomotion Level    Examination-Participation Restrictions Community Activity;Cleaning    Stability/Clinical Decision Making Evolving/Moderate complexity    Clinical Decision Making Moderate    Rehab Potential Good    PT Frequency 2x / week    PT Duration 6 weeks    PT Treatment/Interventions ADLs/Self Care Home Management;Aquatic Therapy;Moist Heat;Cryotherapy;DME Instruction;Gait training;Stair training;Functional mobility training;Therapeutic activities;Therapeutic exercise;Balance training;Neuromuscular re-education;Patient/family education;Manual techniques;Passive range of motion    PT Next Visit Plan Review old HEP and update/revise. Work on tall kneeling and coordination tasks, standing balance    Recommended Other Services Occupational Therapy? Aquatic Therapy    Consulted and Agree with Plan of Care Patient             Patient will benefit from skilled therapeutic intervention in order to improve the following deficits and impairments:  Abnormal gait, Decreased balance, Decreased activity tolerance, Decreased coordination, Decreased knowledge of use of DME, Decreased safety awareness, Decreased strength, Difficulty walking, Decreased endurance  Visit Diagnosis: Unsteadiness on feet  Muscle weakness (generalized)  Other abnormalities of gait and mobility  Other lack of coordination     Problem List Patient Active Problem List   Diagnosis Date Noted   Anterior dislocation of right shoulder 03/01/2016     Tempie Donning, PT, DPT 03/11/2021, 2:52 PM  [facility] [address] [place], [place], [address] Phone: [phone]   Fax:  [phone]  Name: Samuel Stanley MRN: [id number] Date of Birth: 1991-02-21

## 2021-03-21 ENCOUNTER — Other Ambulatory Visit (HOSPITAL_COMMUNITY): Payer: Self-pay

## 2021-03-21 DIAGNOSIS — F331 Major depressive disorder, recurrent, moderate: Secondary | ICD-10-CM | POA: Diagnosis not present

## 2021-03-21 DIAGNOSIS — G319 Degenerative disease of nervous system, unspecified: Secondary | ICD-10-CM | POA: Diagnosis not present

## 2021-03-21 DIAGNOSIS — Z6833 Body mass index (BMI) 33.0-33.9, adult: Secondary | ICD-10-CM | POA: Diagnosis not present

## 2021-03-21 DIAGNOSIS — E538 Deficiency of other specified B group vitamins: Secondary | ICD-10-CM | POA: Diagnosis not present

## 2021-03-21 MED ORDER — DULOXETINE HCL 60 MG PO CPEP
60.0000 mg | ORAL_CAPSULE | Freq: Every day | ORAL | 1 refills | Status: DC
  Filled 2021-03-21: qty 90, 90d supply, fill #0
  Filled 2021-06-25: qty 90, 90d supply, fill #1

## 2021-03-24 ENCOUNTER — Ambulatory Visit: Payer: 59 | Attending: Family Medicine

## 2021-03-24 ENCOUNTER — Other Ambulatory Visit: Payer: Self-pay

## 2021-03-24 DIAGNOSIS — R2689 Other abnormalities of gait and mobility: Secondary | ICD-10-CM | POA: Insufficient documentation

## 2021-03-24 DIAGNOSIS — M6281 Muscle weakness (generalized): Secondary | ICD-10-CM | POA: Insufficient documentation

## 2021-03-24 DIAGNOSIS — R2681 Unsteadiness on feet: Secondary | ICD-10-CM | POA: Insufficient documentation

## 2021-03-24 DIAGNOSIS — R278 Other lack of coordination: Secondary | ICD-10-CM | POA: Insufficient documentation

## 2021-03-24 NOTE — Patient Instructions (Signed)
Access Code: [email or web address] URL: [email or web address] Date: 03/24/2021 Prepared by: Jethro Bastos  Program Notes Lateral Step Outs:   - At the counter with light support, with one leg step out to the side to target x 10 reps, keeping the other leg stationary. Then complete x 10 reps with the opposite leg.    Exercises Quadruped Alternating Arm Lift - 1 x daily - 5 x weekly - 1 sets - 10 reps Seated March with Opposite Side Arm Raise - 1 x daily - 5 x weekly - 1 sets - 10 reps

## 2021-03-24 NOTE — Therapy (Signed)
[facility] [address] [place], [place], [address] Phone: [phone]   Fax:  [phone]  Physical Therapy Treatment  Patient Details  Name: Samuel Stanley MRN: [id number] Date of Birth: Dec 18, 1990 Referring Provider (PT): Luella Cook, MD (([facility]))   Encounter Date: 03/24/2021   PT End of Session - 03/24/21 1623     Visit Number 2    Number of Visits 13    Date for PT Re-Evaluation 04/29/21    Authorization Type UMR    PT Start Time [id number]   pt checked in late   PT Stop Time 1659    PT Time Calculation (min) 38 min    Equipment Utilized During Treatment Gait belt    Activity Tolerance Patient tolerated treatment well    Behavior During Therapy WFL for tasks assessed/performed             Past Medical History:  Diagnosis Date   Hypertension     Past Surgical History:  Procedure Laterality Date   HERNIA REPAIR     testicular torsion      There were no vitals filed for this visit.   Subjective Assessment - 03/24/21 1623     Subjective No new changes/complaints. No falls to report.    Pertinent History HTN, Cerebellar Ataxia    Limitations Walking;Standing;House hold activities;Lifting    Diagnostic tests MRI brain from 03/2018 reportedly showed severe cerebellar atrophy    Patient Stated Goals Improve the walking and balance    Currently in Pain? No/denies               [facility] Adult PT Treatment/Exercise - 03/24/21 0001       Transfers   Transfers Sit to Stand;Stand to Sit    Sit to Stand 5: Supervision;4: Min guard    Stand to Sit 5: Supervision;4: Min guard    Comments completed sit <> stand from mat, one episode of patient with legs posterior behind mat requiring CGA - Min A to maintain balance.      Ambulation/Gait   Ambulation/Gait Yes    Ambulation/Gait Assistance 5: Supervision;4: Min guard    Ambulation/Gait Assistance Details ambulation into/out of therapy session, cues for  step length and focus on control.    Assistive device None    Gait Pattern Ataxic;Wide base of support;Decreased step length - right;Decreased step length - left;Decreased stance time - right;Decreased stance time - left    Ambulation Surface Level;Indoor      Neuro Re-ed    Neuro Re-ed Details  on mat, helped patient transition into quadruped position, cues for abdominal engagement to promote neutral spine, then in quadruped completed alternating UE raises x 10 reps bilat, then with UE support completed alteranting LE extension x 10 reps bilat. increased challenge with stabiltiy with LE movement > UE movement. Then seated on EOM, compelted marching with opposite arm raise to work on coordination activity x 10 reps, cues for abdominal engagement to promtoe upright posture. In // bars: completed steps up to 4" step x 10 reps, alteranting LE progressing from bil UE support > single UE support. Then with target, completed Ant/Post stepping strategy to target x 10 reps, and then lateral stepping to target x 10 reps. Reviewed exercises and added to HEP.                     PT Education - 03/24/21 1701     Education Details Initial HEP Provided; Bring Rollator into  next session    Person(s) Educated Patient    Methods Explanation;Demonstration;Handout    Comprehension Verbalized understanding;Returned demonstration              PT Short Term Goals - 03/11/21 1447       PT SHORT TERM GOAL #1   Title Patient will be independent with initial HEP for balance/strength (ALL STGs Due: 04/08/21)    Baseline HEP established at prior POC    Time 3    Period Weeks    Status New    Target Date 04/08/21      PT SHORT TERM GOAL #2   Title Patient will improve Berg Balance to >/= 35/56 to demo improved balance    Baseline 33/56    Time 3    Period Weeks    Status New      PT SHORT TERM GOAL #3   Title Patient will demo ability to complete 5x sit <> stand in </= 22 seconds with UE  support to demo improved functional strength/mobility    Baseline 26.10 secs with UE support    Time 3    Period Weeks    Status New      PT SHORT TERM GOAL #4   Title Patient will demo ability to complete TUG </= 18 seconds to demonstrate reduced fall risk    Baseline 21.08 secs without AD    Time 3    Period Weeks    Status New               PT Long Term Goals - 03/11/21 1449       PT LONG TERM GOAL #1   Title Patient will be independent with final HEP for balance/strengthening (ALL LTGs Due: 04/29/21)    Baseline HEP established at prior POC    Time 6    Period Weeks    Status New    Target Date 04/29/21      PT LONG TERM GOAL #2   Title Pt will increase Berg score >/= 40/56 to demonstrate improved balance and decreased fall risk.    Baseline 33/56    Time 6    Period Weeks    Status New      PT LONG TERM GOAL #3   Title Patient will improve TUG to </= 15 secs with no AD vs. LRAD to demo improved balance    Baseline 21.08 secs    Time 6    Period Weeks    Status New      PT LONG TERM GOAL #4   Title Patient will improve 5x sit<> stand to </= 18 seconds with UE to demo improved functional mobility    Baseline 26.10 secs with UE support    Time 6    Period Weeks    Status New      PT LONG TERM GOAL #5   Title Patient will improve gait speed to >/= 2.5 ft/sec with LRAD to demo improved community ambulation    Baseline 2.21 ft/sec    Time 6    Period Weeks    Status New                   Plan - 03/24/21 1909     Clinical Impression Statement Revised and updated HEP providing at prior POC, and updated to patient's current status. Main focus on coordination and standing stepping strategies to promote improved balance. PT requesting rollator to be brought into next session, with patietn  agreeable.    Personal Factors and Comorbidities Comorbidity 2;Time since onset of injury/illness/exacerbation    Comorbidities HTN, Cerebellar Ataxia     Examination-Activity Limitations Transfers;Stairs;Stand;Locomotion Level    Examination-Participation Restrictions Community Activity;Cleaning    Stability/Clinical Decision Making Evolving/Moderate complexity    Rehab Potential Good    PT Frequency 2x / week    PT Duration 6 weeks    PT Treatment/Interventions ADLs/Self Care Home Management;Aquatic Therapy;Moist Heat;Cryotherapy;DME Instruction;Gait training;Stair training;Functional mobility training;Therapeutic activities;Therapeutic exercise;Balance training;Neuromuscular re-education;Patient/family education;Manual techniques;Passive range of motion    PT Next Visit Plan How was exercises? Work on tall kneeling and coordination tasks, standing balance    Consulted and Agree with Plan of Care Patient             Patient will benefit from skilled therapeutic intervention in order to improve the following deficits and impairments:  Abnormal gait, Decreased balance, Decreased activity tolerance, Decreased coordination, Decreased knowledge of use of DME, Decreased safety awareness, Decreased strength, Difficulty walking, Decreased endurance  Visit Diagnosis: Unsteadiness on feet  Muscle weakness (generalized)  Other abnormalities of gait and mobility     Problem List Patient Active Problem List   Diagnosis Date Noted   Anterior dislocation of right shoulder 03/01/2016    Jones Bales, PT, DPT 03/24/2021, 7:11 PM  [facility] [address] [place], [place], [address] Phone: [phone]   Fax:  [phone]  Name: Samuel Stanley MRN: [id number] Date of Birth: [birth year]

## 2021-03-30 ENCOUNTER — Ambulatory Visit: Payer: 59

## 2021-03-30 ENCOUNTER — Other Ambulatory Visit: Payer: Self-pay

## 2021-03-30 DIAGNOSIS — R2689 Other abnormalities of gait and mobility: Secondary | ICD-10-CM

## 2021-03-30 DIAGNOSIS — R278 Other lack of coordination: Secondary | ICD-10-CM

## 2021-03-30 DIAGNOSIS — R2681 Unsteadiness on feet: Secondary | ICD-10-CM | POA: Diagnosis not present

## 2021-03-30 DIAGNOSIS — M6281 Muscle weakness (generalized): Secondary | ICD-10-CM

## 2021-03-30 NOTE — Therapy (Signed)
[facility] [address] [place], [place], [address] Phone: [phone]   Fax:  [phone]  Physical Therapy Treatment  Patient Details  Name: Samuel Stanley MRN: [id number] Date of Birth: [birth year] Referring Provider (PT): Peyton BottomsKyle Todd Mitchell, MD (([facility]))   Encounter Date: 03/30/2021   PT End of Session - 03/30/21 1400     Visit Number 3    Number of Visits 13    Date for PT Re-Evaluation 04/29/21    Authorization Type UMR    PT Start Time 1400    PT Stop Time 1445    PT Time Calculation (min) 45 min    Equipment Utilized During Treatment Gait belt    Activity Tolerance Patient tolerated treatment well    Behavior During Therapy WFL for tasks assessed/performed             Past Medical History:  Diagnosis Date   Hypertension     Past Surgical History:  Procedure Laterality Date   HERNIA REPAIR     testicular torsion      There were no vitals filed for this visit.   Subjective Assessment - 03/30/21 1403     Subjective No new changes/complaints. No pain/falls. [facility] science [facility] and used the rollator. Brings rollator into session. Reports exercises went good, wife is making me do them.    Pertinent History HTN, Cerebellar Ataxia    Limitations Walking;Standing;House hold activities;Lifting    Diagnostic tests MRI brain from 03/2018 reportedly showed severe cerebellar atrophy    Patient Stated Goals Improve the walking and balance    Currently in Pain? No/denies               [facility]Ex[facility] - 03/30/21 0001       Transfers   Transfers Sit to Stand;Stand to Sit    Sit to Stand 5: Supervision;4: Min guard    Stand to Sit 5: Supervision;4: Min guard    Number of Reps 10 reps;1 set    Comments completed sit <> stand from mat x 10 reps, with cues to scoot forward to avoid to avoid extending into the mask. CGA - Min A to maintain balance.      Ambulation/Gait    Ambulation/Gait Yes    Ambulation/Gait Assistance 5: Supervision;4: Min guard    Ambulation/Gait Assistance Details completed ambulation with rollator x 250 ft with rollator, cues for proper use of AD and avoidance of extension of elbows. Cues for step length. Second trial with rollator completed x 250 ft with 5# on bil ankles, improved step length and contorl noted. intemrittnet cues for keeping AD close.    Ambulation Distance (Feet) 250 Feet   x 2   Assistive device None;Rollator    Gait Pattern Ataxic;Wide base of support;Decreased step length - right;Decreased step length - left;Decreased stance time - right;Decreased stance time - left    Ambulation Surface Level;Indoor    Gait Comments Continued education on turning the Rollator prior to descent onto surface for safety. Patient brake on the L side not working, therefore PT tech and PT fixed brake to allow for improved use of rollator and safety.      Neuro Re-ed    Neuro Re-ed Details  in // bars with 5# ankle weights: completed ant/post stepping strategy to target bilat x 10 reps alternating, and then progressed to lateral step out/in to target x 10 reps bilat. incrased challenge with RLE > LLE. Then with single UE  support completed alternating marching x 10 reps bilat, cues for abdominal engagement to maintain postture with lifting.                     PT Education - 03/30/21 1647     Education Details Safe use of Rollator    Person(s) Educated Patient    Methods Explanation;Demonstration    Comprehension Verbalized understanding;Returned demonstration              PT Short Term Goals - 03/11/21 1447       PT SHORT TERM GOAL #1   Title Patient will be independent with initial HEP for balance/strength (ALL STGs Due: 04/08/21)    Baseline HEP established at prior POC    Time 3    Period Weeks    Status New    Target Date 04/08/21      PT SHORT TERM GOAL #2   Title Patient will improve Berg Balance to >/= 35/56  to demo improved balance    Baseline 33/56    Time 3    Period Weeks    Status New      PT SHORT TERM GOAL #3   Title Patient will demo ability to complete 5x sit <> stand in </= 22 seconds with UE support to demo improved functional strength/mobility    Baseline 26.10 secs with UE support    Time 3    Period Weeks    Status New      PT SHORT TERM GOAL #4   Title Patient will demo ability to complete TUG </= 18 seconds to demonstrate reduced fall risk    Baseline 21.08 secs without AD    Time 3    Period Weeks    Status New               PT Long Term Goals - 03/11/21 1449       PT LONG TERM GOAL #1   Title Patient will be independent with final HEP for balance/strengthening (ALL LTGs Due: 04/29/21)    Baseline HEP established at prior POC    Time 6    Period Weeks    Status New    Target Date 04/29/21      PT LONG TERM GOAL #2   Title Pt will increase Berg score >/= 40/56 to demonstrate improved balance and decreased fall risk.    Baseline 33/56    Time 6    Period Weeks    Status New      PT LONG TERM GOAL #3   Title Patient will improve TUG to </= 15 secs with no AD vs. LRAD to demo improved balance    Baseline 21.08 secs    Time 6    Period Weeks    Status New      PT LONG TERM GOAL #4   Title Patient will improve 5x sit<> stand to </= 18 seconds with UE to demo improved functional mobility    Baseline 26.10 secs with UE support    Time 6    Period Weeks    Status New      PT LONG TERM GOAL #5   Title Patient will improve gait speed to >/= 2.5 ft/sec with LRAD to demo improved community ambulation    Baseline 2.21 ft/sec    Time 6    Period Weeks    Status New  Plan - 03/30/21 1647     Clinical Impression Statement Pt brought in personal rollator ot session, adjusted to patient's height and fixed brake as it was not working properly to promote improved safety. Continued gait training with this AD, and added ankle  weights with improvements noted in control/step length. Continued stepping strategy activities with weights on BLE. Will continue per POC.    Personal Factors and Comorbidities Comorbidity 2;Time since onset of injury/illness/exacerbation    Comorbidities HTN, Cerebellar Ataxia    Examination-Activity Limitations Transfers;Stairs;Stand;Locomotion Level    Examination-Participation Restrictions Community Activity;Cleaning    Stability/Clinical Decision Making Evolving/Moderate complexity    Rehab Potential Good    PT Frequency 2x / week    PT Duration 6 weeks    PT Treatment/Interventions ADLs/Self Care Home Management;Aquatic Therapy;Moist Heat;Cryotherapy;DME Instruction;Gait training;Stair training;Functional mobility training;Therapeutic activities;Therapeutic exercise;Balance training;Neuromuscular re-education;Patient/family education;Manual techniques;Passive range of motion    PT Next Visit Plan Ankle weights iwth gait, Work on tall kneeling and coordination tasks, standing balance    Consulted and Agree with Plan of Care Patient             Patient will benefit from skilled therapeutic intervention in order to improve the following deficits and impairments:  Abnormal gait, Decreased balance, Decreased activity tolerance, Decreased coordination, Decreased knowledge of use of DME, Decreased safety awareness, Decreased strength, Difficulty walking, Decreased endurance  Visit Diagnosis: Unsteadiness on feet  Muscle weakness (generalized)  Other abnormalities of gait and mobility  Other lack of coordination     Problem List Patient Active Problem List   Diagnosis Date Noted   Anterior dislocation of right shoulder 03/01/2016    Tempie Donning, PT, DPT 03/30/2021, 4:49 PM  [facility] [address] [place], [place], [address] Phone: [phone]   Fax:  [phone]  Name: Samuel Stanley MRN: [id number] Date of  Birth: [birth year]

## 2021-04-01 ENCOUNTER — Ambulatory Visit: Payer: 59

## 2021-04-01 ENCOUNTER — Other Ambulatory Visit: Payer: Self-pay

## 2021-04-01 DIAGNOSIS — M6281 Muscle weakness (generalized): Secondary | ICD-10-CM

## 2021-04-01 DIAGNOSIS — R2681 Unsteadiness on feet: Secondary | ICD-10-CM

## 2021-04-01 DIAGNOSIS — R2689 Other abnormalities of gait and mobility: Secondary | ICD-10-CM

## 2021-04-01 DIAGNOSIS — R278 Other lack of coordination: Secondary | ICD-10-CM | POA: Diagnosis not present

## 2021-04-01 NOTE — Therapy (Signed)
[facility] [address] [place], [place], [address] Phone: [phone]   Fax:  [phone]  Physical Therapy Treatment  Patient Details  Name: Samuel Stanley MRN: [id number] Date of Birth: 1990/12/14 Referring Provider (PT): Peyton BottomsKyle Todd Mitchell, MD (([facility]))   Encounter Date: 04/01/2021   PT End of Session - 04/01/21 1354     Visit Number 4    Number of Visits 13    Date for PT Re-Evaluation 04/29/21    Authorization Type UMR    PT Start Time 1401    PT Stop Time 1446    PT Time Calculation (min) 45 min    Equipment Utilized During Treatment Gait belt    Activity Tolerance Patient tolerated treatment well    Behavior During Therapy WFL for tasks assessed/performed             Past Medical History:  Diagnosis Date   Hypertension     Past Surgical History:  Procedure Laterality Date   HERNIA REPAIR     testicular torsion      There were no vitals filed for this visit.   Subjective Assessment - 04/01/21 1355     Subjective No new changes/complaints. No falls. Continues to ambulate into session with rollator.    Pertinent History HTN, Cerebellar Ataxia    Limitations Walking;Standing;House hold activities;Lifting    Diagnostic tests MRI brain from 03/2018 reportedly showed severe cerebellar atrophy    Patient Stated Goals Improve the walking and balance    Currently in Pain? No/denies              [facility]/Exercise - 04/01/21 0001       Transfers   Transfers Sit to Stand;Stand to Sit    Sit to Stand 5: Supervision;4: Min guard    Stand to Sit 5: Supervision;4: Min guard    Number of Reps 10 reps;1 set    Comments completed sit <> stand from mat x 10 reps, with cues to scoot forward to avoid to avoid extending into the mask. Patient demo improvements today, PT educating wife on ways to cue to demo improvements at home.      Ambulation/Gait   Ambulation/Gait Yes    Ambulation/Gait  Assistance 5: Supervision;4: Min guard    Ambulation/Gait Assistance Details completed ambulation x 115 ft with 3# ankle weights and no AD, shortened step length dispite cues. Then completed x 115 ft with 3# ankle weights and rollator, working on slowed gait, improved weight shift and equal step length. Then trialed x 115 ft with AD and no ankle weight, improvements noted in step length but pt would have instances of overstriding, therefore requiring cues for equal step length. Facilatiion at pelvis provided.    Ambulation Distance (Feet) 115 Feet   x 2   Assistive device None;Rollator    Gait Pattern Ataxic;Wide base of support;Decreased step length - right;Decreased step length - left;Decreased stance time - right;Decreased stance time - left    Ambulation Surface Level;Indoor    Stairs Yes    Stairs Assistance 5: Supervision;4: Min guard    Stairs Assistance Details (indicate cue type and reason) reciprocal pattern with single rail on L side ascending.    Stair Management Technique Alternating pattern;Forwards;One rail Left    Number of Stairs 8    Height of Stairs 6      Neuro Re-ed    Neuro Re-ed Details  in tall kneeling on mat with intermittent UE suppot from  bench, copleted alternating UE raises x 10 reps bilat, then maintaining hip extension 2 x 30 seconds without UE support. Then completed reaching activity across midline x 8 reps with BUE support. Then completed hip hinges x 10 reps, cues for upright posture and improved hinge at hips vs. forward flexion. Standing at stairs with single UE support, completed alteranting toe taps to 1st step x 15 reps bilat.                       PT Short Term Goals - 03/11/21 1447       PT SHORT TERM GOAL #1   Title Patient will be independent with initial HEP for balance/strength (ALL STGs Due: 04/08/21)    Baseline HEP established at prior POC    Time 3    Period Weeks    Status New    Target Date 04/08/21      PT SHORT TERM GOAL  #2   Title Patient will improve Berg Balance to >/= 35/56 to demo improved balance    Baseline 33/56    Time 3    Period Weeks    Status New      PT SHORT TERM GOAL #3   Title Patient will demo ability to complete 5x sit <> stand in </= 22 seconds with UE support to demo improved functional strength/mobility    Baseline 26.10 secs with UE support    Time 3    Period Weeks    Status New      PT SHORT TERM GOAL #4   Title Patient will demo ability to complete TUG </= 18 seconds to demonstrate reduced fall risk    Baseline 21.08 secs without AD    Time 3    Period Weeks    Status New               PT Long Term Goals - 03/11/21 1449       PT LONG TERM GOAL #1   Title Patient will be independent with final HEP for balance/strengthening (ALL LTGs Due: 04/29/21)    Baseline HEP established at prior POC    Time 6    Period Weeks    Status New    Target Date 04/29/21      PT LONG TERM GOAL #2   Title Pt will increase Berg score >/= 40/56 to demonstrate improved balance and decreased fall risk.    Baseline 33/56    Time 6    Period Weeks    Status New      PT LONG TERM GOAL #3   Title Patient will improve TUG to </= 15 secs with no AD vs. LRAD to demo improved balance    Baseline 21.08 secs    Time 6    Period Weeks    Status New      PT LONG TERM GOAL #4   Title Patient will improve 5x sit<> stand to </= 18 seconds with UE to demo improved functional mobility    Baseline 26.10 secs with UE support    Time 6    Period Weeks    Status New      PT LONG TERM GOAL #5   Title Patient will improve gait speed to >/= 2.5 ft/sec with LRAD to demo improved community ambulation    Baseline 2.21 ft/sec    Time 6    Period Weeks    Status New  Plan - 04/01/21 1459     Clinical Impression Statement Continued gait training with ankle weights vs. no ankle weights with AD to allow more focus on equal weight shift/step length. Continued NMR in tall  kneeling to work on coordination/proximal stability. Patient tolerating activities well. Will continue per POC.    Personal Factors and Comorbidities Comorbidity 2;Time since onset of injury/illness/exacerbation    Comorbidities HTN, Cerebellar Ataxia    Examination-Activity Limitations Transfers;Stairs;Stand;Locomotion Level    Examination-Participation Restrictions Community Activity;Cleaning    Stability/Clinical Decision Making Evolving/Moderate complexity    Rehab Potential Good    PT Frequency 2x / week    PT Duration 6 weeks    PT Treatment/Interventions ADLs/Self Care Home Management;Aquatic Therapy;Moist Heat;Cryotherapy;DME Instruction;Gait training;Stair training;Functional mobility training;Therapeutic activities;Therapeutic exercise;Balance training;Neuromuscular re-education;Patient/family education;Manual techniques;Passive range of motion    PT Next Visit Plan Ankle weights iwth gait, Work on tall kneeling and coordination tasks, standing balance. Will need STG goal check next week.    Consulted and Agree with Plan of Care Patient             Patient will benefit from skilled therapeutic intervention in order to improve the following deficits and impairments:  Abnormal gait, Decreased balance, Decreased activity tolerance, Decreased coordination, Decreased knowledge of use of DME, Decreased safety awareness, Decreased strength, Difficulty walking, Decreased endurance  Visit Diagnosis: Unsteadiness on feet  Muscle weakness (generalized)  Other abnormalities of gait and mobility  Other lack of coordination     Problem List Patient Active Problem List   Diagnosis Date Noted   Anterior dislocation of right shoulder 03/01/2016    Tempie Donning, PT, DPT 04/01/2021, 3:00 PM  [facility] [address] [place], [place], [address] Phone: [phone]   Fax:  [phone]  Name: Samuel Stanley MRN:  [id number] Date of Birth: 12/01/1990

## 2021-04-06 ENCOUNTER — Other Ambulatory Visit: Payer: Self-pay

## 2021-04-06 ENCOUNTER — Ambulatory Visit: Payer: 59

## 2021-04-06 DIAGNOSIS — R278 Other lack of coordination: Secondary | ICD-10-CM

## 2021-04-06 DIAGNOSIS — R2681 Unsteadiness on feet: Secondary | ICD-10-CM | POA: Diagnosis not present

## 2021-04-06 DIAGNOSIS — M6281 Muscle weakness (generalized): Secondary | ICD-10-CM | POA: Diagnosis not present

## 2021-04-06 DIAGNOSIS — R2689 Other abnormalities of gait and mobility: Secondary | ICD-10-CM

## 2021-04-06 NOTE — Therapy (Signed)
[facility] [address] [place], [place], [address] Phone: [phone]   Fax:  [phone]  Physical Therapy Treatment  Patient Details  Name: Samuel Stanley MRN: [id number] Date of Birth: [birth year] Referring Provider (PT): Luella Cook, MD (([facility]))   Encounter Date: 04/06/2021   PT End of Session - 04/06/21 1406     Visit Number 5    Number of Visits 13    Date for PT Re-Evaluation 04/29/21    Authorization Type UMR    PT Start Time 1400    PT Stop Time 1443    PT Time Calculation (min) 43 min    Equipment Utilized During Treatment Gait belt    Activity Tolerance Patient tolerated treatment well    Behavior During Therapy WFL for tasks assessed/performed             Past Medical History:  Diagnosis Date   Hypertension     Past Surgical History:  Procedure Laterality Date   HERNIA REPAIR     testicular torsion      There were no vitals filed for this visit.   Subjective Assessment - 04/06/21 1404     Subjective No new changes/complaints. Reports exercises still going well. No falls.    Pertinent History HTN, Cerebellar Ataxia    Limitations Walking;Standing;House hold activities;Lifting    Diagnostic tests MRI brain from 03/2018 reportedly showed severe cerebellar atrophy    Patient Stated Goals Improve the walking and balance    Currently in Pain? No/denies                [facility] Adult PT Treatment/Exercise - 04/06/21 0001       Transfers   Transfers Sit to Stand;Stand to Sit    Sit to Stand 5: Supervision;4: Min guard    Stand to Sit 5: Supervision;4: Min guard    Number of Reps 10 reps;1 set    Comments continued sit <> stand from mat x 10 reps,no cues for scooting forward required today able to demo properly with each rep.      Ambulation/Gait   Ambulation/Gait Yes    Ambulation/Gait Assistance 5: Supervision    Ambulation/Gait Assistance Details ambulation throughout therapy  gym with activities    Ambulation Distance (Feet) --   clinic distance   Assistive device None    Gait Pattern Ataxic;Wide base of support;Decreased step length - right;Decreased step length - left;Decreased stance time - right;Decreased stance time - left    Ambulation Surface Level;Indoor    Ramp 5: Supervision;4: Min assist    Ramp Details (indicate cue type and reason) completed ambulation up/down ramp, more imbalance noted with descent. Cues for slowed pace/control. Completed x 4 reps. PT providing CGA/supervision .      Neuro Re-ed    Neuro Re-ed Details  on mat, helped patient transition into quadruped position, cues for abdominal engagement to promote neutral spine, then in quadruped completed alternating UE raises 2 x 10 reps bilat. PT providing cues for neutral spine.      Exercises   Exercises Knee/Hip      Knee/Hip Exercises: Aerobic   Nustep Completed NuStep with BLE/BUE to trial as plans to return to gym and interested in community wellness. Completed on Level 6 x 6 minutes with patient tolerating well. PT eduating will continue to trial, and get comfortable with use of equipment prior to performing outside of PT session, patietn agreeable.  Balance Exercises - 04/06/21 0001       Balance Exercises: Standing   Standing, One Foot on a Step Eyes open;4 inch;Limitations    Standing, One Foot on a Step Limitations with single LE on step, opposite on firm surface, completed EO 2 x 30 seconds bilat. Then added in horizontal head turns x 10 reps alteranting foot position. More challenge with LLE posterior.    Step Over Hurdles / Cones completed steps over yard sticks, with UE support in // bars compelted reciprocal step over x 4 laps, then without UE support completed step to pattern over sticks x 4 laps. CGA and cues for step length.                  PT Short Term Goals - 03/11/21 1447       PT SHORT TERM GOAL #1   Title Patient will be  independent with initial HEP for balance/strength (ALL STGs Due: 04/08/21)    Baseline HEP established at prior POC    Time 3    Period Weeks    Status New    Target Date 04/08/21      PT SHORT TERM GOAL #2   Title Patient will improve Berg Balance to >/= 35/56 to demo improved balance    Baseline 33/56    Time 3    Period Weeks    Status New      PT SHORT TERM GOAL #3   Title Patient will demo ability to complete 5x sit <> stand in </= 22 seconds with UE support to demo improved functional strength/mobility    Baseline 26.10 secs with UE support    Time 3    Period Weeks    Status New      PT SHORT TERM GOAL #4   Title Patient will demo ability to complete TUG </= 18 seconds to demonstrate reduced fall risk    Baseline 21.08 secs without AD    Time 3    Period Weeks    Status New               PT Long Term Goals - 03/11/21 1449       PT LONG TERM GOAL #1   Title Patient will be independent with final HEP for balance/strengthening (ALL LTGs Due: 04/29/21)    Baseline HEP established at prior POC    Time 6    Period Weeks    Status New    Target Date 04/29/21      PT LONG TERM GOAL #2   Title Pt will increase Berg score >/= 40/56 to demonstrate improved balance and decreased fall risk.    Baseline 33/56    Time 6    Period Weeks    Status New      PT LONG TERM GOAL #3   Title Patient will improve TUG to </= 15 secs with no AD vs. LRAD to demo improved balance    Baseline 21.08 secs    Time 6    Period Weeks    Status New      PT LONG TERM GOAL #4   Title Patient will improve 5x sit<> stand to </= 18 seconds with UE to demo improved functional mobility    Baseline 26.10 secs with UE support    Time 6    Period Weeks    Status New      PT LONG TERM GOAL #5   Title Patient will improve gait speed to >/=  2.5 ft/sec with LRAD to demo improved community ambulation    Baseline 2.21 ft/sec    Time 6    Period Weeks    Status New                    Plan - 04/06/21 1440     Clinical Impression Statement Continued NMR activities working on coordination in quadruped, as well as reciprocal stepping with ambulation and ramp negotiation. CGA required intermittently. Trialed NuStep as patient/wife interested in him returning to gym. Pt tolerated it well today. Will continue to progress toward all LTGs.    Personal Factors and Comorbidities Comorbidity 2;Time since onset of injury/illness/exacerbation    Comorbidities HTN, Cerebellar Ataxia    Examination-Activity Limitations Transfers;Stairs;Stand;Locomotion Level    Examination-Participation Restrictions Community Activity;Cleaning    Stability/Clinical Decision Making Evolving/Moderate complexity    Rehab Potential Good    PT Frequency 2x / week    PT Duration 6 weeks    PT Treatment/Interventions ADLs/Self Care Home Management;Aquatic Therapy;Moist Heat;Cryotherapy;DME Instruction;Gait training;Stair training;Functional mobility training;Therapeutic activities;Therapeutic exercise;Balance training;Neuromuscular re-education;Patient/family education;Manual techniques;Passive range of motion    PT Next Visit Plan Check STGs. Ankle weights w/ gait, Work on tall kneeling and coordination tasks, standing balance.Geanie Cooley and Agree with Plan of Care Patient             Patient will benefit from skilled therapeutic intervention in order to improve the following deficits and impairments:  Abnormal gait, Decreased balance, Decreased activity tolerance, Decreased coordination, Decreased knowledge of use of DME, Decreased safety awareness, Decreased strength, Difficulty walking, Decreased endurance  Visit Diagnosis: Unsteadiness on feet  Muscle weakness (generalized)  Other abnormalities of gait and mobility  Other lack of coordination     Problem List Patient Active Problem List   Diagnosis Date Noted   Anterior dislocation of right shoulder 03/01/2016     Jones Bales, PT, DPT 04/06/2021, 2:44 PM  [facility] [address] [place], [place], [address] Phone: [phone]   Fax:  [phone]  Name: Samuel Stanley MRN: [id number] Date of Birth: 1990/05/06

## 2021-04-08 ENCOUNTER — Ambulatory Visit: Payer: 59

## 2021-04-08 ENCOUNTER — Other Ambulatory Visit: Payer: Self-pay

## 2021-04-08 DIAGNOSIS — M6281 Muscle weakness (generalized): Secondary | ICD-10-CM

## 2021-04-08 DIAGNOSIS — R2689 Other abnormalities of gait and mobility: Secondary | ICD-10-CM | POA: Diagnosis not present

## 2021-04-08 DIAGNOSIS — R2681 Unsteadiness on feet: Secondary | ICD-10-CM

## 2021-04-08 DIAGNOSIS — R278 Other lack of coordination: Secondary | ICD-10-CM | POA: Diagnosis not present

## 2021-04-08 NOTE — Therapy (Signed)
[facility] [address] [place], [place], [address] Phone: [phone]   Fax:  [phone]  Physical Therapy Treatment  Patient Details  Name: Samuel Stanley MRN: [id number] Date of Birth: [birth year] Referring Provider (PT): [name], MD (([facility]))   Encounter Date: 04/08/2021   PT End of Session - 04/08/21 1406     Visit Number 6    Number of Visits 13    Date for PT Re-Evaluation 04/29/21    Authorization Type UMR    PT Start Time 1402    PT Stop Time 1445    PT Time Calculation (min) 43 min    Equipment Utilized During Treatment Gait belt    Activity Tolerance Patient tolerated treatment well    Behavior During Therapy WFL for tasks assessed/performed             Past Medical History:  Diagnosis Date   Hypertension     Past Surgical History:  Procedure Laterality Date   HERNIA REPAIR     testicular torsion      There were no vitals filed for this visit.   Subjective Assessment - 04/08/21 1405     Subjective No new changes/complaints. No falls. No pain.    Pertinent History HTN, Cerebellar Ataxia    Limitations Walking;Standing;House hold activities;Lifting    Diagnostic tests MRI brain from 03/2018 reportedly showed severe cerebellar atrophy    Patient Stated Goals Improve the walking and balance    Currently in Pain? No/denies                [facility] PT Assessment - 04/08/21 0001       Standardized Balance Assessment   Standardized Balance Assessment Timed Up and Go Test      Timed Up and Go Test   TUG Normal TUG    Normal TUG (seconds) 15.72    TUG Comments without AD, supervision. PT stabilizing chair                [facility] Adult PT Treatment/Exercise - 04/08/21 0001       Transfers   Transfers Sit to Stand;Stand to Sit    Sit to Stand 5: Supervision;4: Min guard    Five time sit to stand comments  32.1 secs with UE support from mat. No heavy extension noted and  improved control with sit <> stand    Stand to Sit 5: Supervision;4: Min guard      Ambulation/Gait   Ambulation/Gait Yes    Ambulation/Gait Assistance 5: Supervision;4: Min guard    Ambulation/Gait Assistance Details Due to challenge with ambulation without AD on unlevel surfaces, trialed bilateral and single trekking pole as option for gait outdoors. patient unable to sequence bilateral trekking poles due to cognitive challenge therefore transitioned to single trekking pole, anble to demo some improvements but continue to demo inconsistent step length with this AD as well as often placing the pole in front of the LE throwing off balance/stepping pattern. Patient dmeo better step length without use of trekking pole, but more unsteadiness requiring CGA. Will continue to trial vairous AD to determine best option for outdoor surfaces.    Ambulation Distance (Feet) 350 Feet   throughout entirety of session, no rest breaks.   Assistive device None    Gait Pattern Ataxic;Wide base of support;Decreased step length - right;Decreased step length - left;Decreased stance time - right;Decreased stance time - left    Ambulation Surface Level;Indoor    Gait Comments gait  over unlevel surfaces (blue mat) in clinic to simulate outdoors, completed x 3 laps with single UE support, and x 2 laps without UE support. No challenge noted with single UE support, but increased balance challenge without. Will need AD to ambulate outdoors on grass surfaces.      Exercises   Exercises Knee/Hip      Knee/Hip Exercises: Aerobic   Nustep Completed NuStep with BLE/BUE continued trial with plans to return to gym and interested in community wellness. Completed on Level 6 x 8 minutes with patient tolerating well. PT demonstrating how to adjust seat/arm settings as well as resitance, with patient returning demonstraintg. Verbalize understanding. PT provided handout on settings.                     PT Education -  04/08/21 1416     Education Details NuStep Settings; Progress toward STGs    Person(s) Educated Patient    Methods Explanation;Demonstration;Handout    Comprehension Returned demonstration;Verbalized understanding              PT Short Term Goals - 04/08/21 1421       PT SHORT TERM GOAL #1   Title Patient will be independent with initial HEP for balance/strength (ALL STGs Due: 04/08/21)    Baseline HEP established at prior POC    Time 3    Period Weeks    Status New    Target Date 04/08/21      PT SHORT TERM GOAL #2   Title Patient will improve Berg Balance to >/= 35/56 to demo improved balance    Baseline 33/56    Time 3    Period Weeks    Status New      PT SHORT TERM GOAL #3   Title Patient will demo ability to complete 5x sit <> stand in </= 22 seconds with UE support to demo improved functional strength/mobility    Baseline 26.10 secs with UE support; 32.1 secs with UE support but improved control noted despite increased time    Time 3    Period Weeks    Status Achieved      PT SHORT TERM GOAL #4   Title Patient will demo ability to complete TUG </= 18 seconds to demonstrate reduced fall risk    Baseline 21.08 secs without AD; 15.72 secs without AD    Time 3    Period Weeks    Status Achieved               PT Long Term Goals - 03/11/21 1449       PT LONG TERM GOAL #1   Title Patient will be independent with final HEP for balance/strengthening (ALL LTGs Due: 04/29/21)    Baseline HEP established at prior POC    Time 6    Period Weeks    Status New    Target Date 04/29/21      PT LONG TERM GOAL #2   Title Pt will increase Berg score >/= 40/56 to demonstrate improved balance and decreased fall risk.    Baseline 33/56    Time 6    Period Weeks    Status New      PT LONG TERM GOAL #3   Title Patient will improve TUG to </= 15 secs with no AD vs. LRAD to demo improved balance    Baseline 21.08 secs    Time 6    Period Weeks    Status New  PT LONG TERM GOAL #4   Title Patient will improve 5x sit<> stand to </= 18 seconds with UE to demo improved functional mobility    Baseline 26.10 secs with UE support    Time 6    Period Weeks    Status New      PT LONG TERM GOAL #5   Title Patient will improve gait speed to >/= 2.5 ft/sec with LRAD to demo improved community ambulation    Baseline 2.21 ft/sec    Time 6    Period Weeks    Status New                   Plan - 04/08/21 1415     Clinical Impression Statement Continued trial of NuStep with PT demonstraitng adjustments to chair/arm, with patient able to return demonstration. Handout provided on settings to have for patient to take with him to participate at gym. Rest of session focused on gait trianing trialing unlevel surfaces to simulate outdoor surfaces, with increased challenge noted. Trialed walking sticks, but unable to sequence properly during session today. Will continue to practice with various AD to determine best/safest option for patient.    Personal Factors and Comorbidities Comorbidity 2;Time since onset of injury/illness/exacerbation    Comorbidities HTN, Cerebellar Ataxia    Examination-Activity Limitations Transfers;Stairs;Stand;Locomotion Level    Examination-Participation Restrictions Community Activity;Cleaning    Stability/Clinical Decision Making Evolving/Moderate complexity    Rehab Potential Good    PT Frequency 2x / week    PT Duration 6 weeks    PT Treatment/Interventions ADLs/Self Care Home Management;Aquatic Therapy;Moist Heat;Cryotherapy;DME Instruction;Gait training;Stair training;Functional mobility training;Therapeutic activities;Therapeutic exercise;Balance training;Neuromuscular re-education;Patient/family education;Manual techniques;Passive range of motion    PT Next Visit Plan Ankle weights w/ gait, Work on tall kneeling and coordination tasks, standing balance.Earlyne Iba.    Consulted and Agree with Plan of Care Patient              Patient will benefit from skilled therapeutic intervention in order to improve the following deficits and impairments:  Abnormal gait, Decreased balance, Decreased activity tolerance, Decreased coordination, Decreased knowledge of use of DME, Decreased safety awareness, Decreased strength, Difficulty walking, Decreased endurance  Visit Diagnosis: Unsteadiness on feet  Muscle weakness (generalized)  Other abnormalities of gait and mobility     Problem List Patient Active Problem List   Diagnosis Date Noted   Anterior dislocation of right shoulder 03/01/2016    Tempie DonningKaitlyn B Sunaina Ferrando, PT, DPT 04/08/2021, 3:38 PM  [facility] [address] [place], [place], [address] Phone: [phone]   Fax:  [phone]  Name: Samuel BobJustin Karch MRN: [id number] Date of Birth: 1990/05/30

## 2021-04-08 NOTE — Patient Instructions (Signed)
NuStep Settings  Seat: 13 Arms: 10 Level: 5-6 Start with completing approx 10 minutes, then progressing by 1-2 minutes as able.

## 2021-04-13 ENCOUNTER — Ambulatory Visit: Payer: 59 | Attending: Family Medicine

## 2021-04-13 ENCOUNTER — Other Ambulatory Visit: Payer: Self-pay

## 2021-04-13 DIAGNOSIS — R2681 Unsteadiness on feet: Secondary | ICD-10-CM | POA: Insufficient documentation

## 2021-04-13 DIAGNOSIS — R278 Other lack of coordination: Secondary | ICD-10-CM | POA: Insufficient documentation

## 2021-04-13 DIAGNOSIS — M6281 Muscle weakness (generalized): Secondary | ICD-10-CM | POA: Insufficient documentation

## 2021-04-13 DIAGNOSIS — R2689 Other abnormalities of gait and mobility: Secondary | ICD-10-CM | POA: Insufficient documentation

## 2021-04-13 NOTE — Therapy (Signed)
[facility] [address] [place], [place], [address] Phone: [phone]   Fax:  [phone]  Physical Therapy Treatment  Patient Details  Name: Samuel Stanley MRN: [id number] Date of Birth: Apr 11, 1990 Referring Provider (PT): Luella Cook, MD (([facility]))   Encounter Date: 04/13/2021   PT End of Session - 04/13/21 1411     Visit Number 7    Number of Visits 13    Date for PT Re-Evaluation 04/29/21    Authorization Type UMR    PT Start Time 1408   pt arriving late to session   PT Stop Time 1443    PT Time Calculation (min) 35 min    Equipment Utilized During Treatment Gait belt    Activity Tolerance Patient tolerated treatment well    Behavior During Therapy WFL for tasks assessed/performed             Past Medical History:  Diagnosis Date   Hypertension     Past Surgical History:  Procedure Laterality Date   HERNIA REPAIR     testicular torsion      There were no vitals filed for this visit.   Subjective Assessment - 04/13/21 1410     Subjective No new changes. Continues to practice scooting forward and the sit <> stands at home.    Pertinent History HTN, Cerebellar Ataxia    Limitations Walking;Standing;House hold activities;Lifting    Diagnostic tests MRI brain from 03/2018 reportedly showed severe cerebellar atrophy    Patient Stated Goals Improve the walking and balance    Currently in Pain? No/denies              [facility] Adult PT Treatment/Exercise - 04/13/21 0001       Transfers   Transfers Sit to Stand;Stand to Sit    Sit to Stand 5: Supervision;4: Min guard    Stand to Sit 5: Supervision;4: Min guard    Number of Reps 1 set;Other reps (comment)   5 reps with cues to make BOS about hip width vs. narrow BOS. x 5 reps with single UE support pushing to stand CGA.   Comments plus additional sit <> stands throughout session      Ambulation/Gait   Ambulation/Gait Yes    Ambulation/Gait  Assistance 5: Supervision;4: Min guard    Ambulation/Gait Assistance Details ambulation throughout therapy gym without AD, intermittent cues for step length and weight shift    Ambulation Distance (Feet) --   clinic distance   Assistive device None    Gait Pattern Ataxic;Wide base of support;Decreased step length - right;Decreased step length - left;Decreased stance time - right;Decreased stance time - left    Ambulation Surface Level;Indoor      Exercises   Exercises Knee/Hip      Knee/Hip Exercises: Aerobic   Nustep Completed NuStep with BLE/BUE. Patient able to complete proper adjustment of machine without cues from PT. Completed on Level 4 x 4, then completed on Level 6 x 2 minutes for endurance training.                 Balance Exercises - 04/13/21 0001       Balance Exercises: Standing   SLS with Vectors Solid surface;Intermittent upper extremity assist;Limitations    SLS with Vectors Limitations standing on firm surface with single UE support and 5# ankle weights completed taps to 6" step x 10 reps bilat. cues for improved control and weight shift to promote improved balance    Sidestepping 3  reps;Limitations    Sidestepping Limitations x 3 laps in // bars working on slowed/controlled pace with 5# ankle weights added. Cues to keep feet pointed forward and faciliation at pelvis to work on control with lateral stepping    Marching Solid surface;Intermittent upper extremity assist;Static;Limitations    Marching Limitations with addition of 5# ankle weights, light UE support, cues for improved hip/knee flexion x 10 reps bilat.                  PT Short Term Goals - 04/08/21 1421       PT SHORT TERM GOAL #1   Title Patient will be independent with initial HEP for balance/strength (ALL STGs Due: 04/08/21)    Baseline HEP established at prior POC    Time 3    Period Weeks    Status New    Target Date 04/08/21      PT SHORT TERM GOAL #2   Title Patient will  improve Berg Balance to >/= 35/56 to demo improved balance    Baseline 33/56    Time 3    Period Weeks    Status New      PT SHORT TERM GOAL #3   Title Patient will demo ability to complete 5x sit <> stand in </= 22 seconds with UE support to demo improved functional strength/mobility    Baseline 26.10 secs with UE support; 32.1 secs with UE support but improved control noted despite increased time    Time 3    Period Weeks    Status Achieved      PT SHORT TERM GOAL #4   Title Patient will demo ability to complete TUG </= 18 seconds to demonstrate reduced fall risk    Baseline 21.08 secs without AD; 15.72 secs without AD    Time 3    Period Weeks    Status Achieved               PT Long Term Goals - 03/11/21 1449       PT LONG TERM GOAL #1   Title Patient will be independent with final HEP for balance/strengthening (ALL LTGs Due: 04/29/21)    Baseline HEP established at prior POC    Time 6    Period Weeks    Status New    Target Date 04/29/21      PT LONG TERM GOAL #2   Title Pt will increase Berg score >/= 40/56 to demonstrate improved balance and decreased fall risk.    Baseline 33/56    Time 6    Period Weeks    Status New      PT LONG TERM GOAL #3   Title Patient will improve TUG to </= 15 secs with no AD vs. LRAD to demo improved balance    Baseline 21.08 secs    Time 6    Period Weeks    Status New      PT LONG TERM GOAL #4   Title Patient will improve 5x sit<> stand to </= 18 seconds with UE to demo improved functional mobility    Baseline 26.10 secs with UE support    Time 6    Period Weeks    Status New      PT LONG TERM GOAL #5   Title Patient will improve gait speed to >/= 2.5 ft/sec with LRAD to demo improved community ambulation    Baseline 2.21 ft/sec    Time 6    Period Weeks  Status New                   Plan - 04/13/21 1443     Clinical Impression Statement Patient able to get on/off NuStep and adjust machine without  cues from PT, plans to go to [facility] tomorrow. Rest of session focused on continued standing balance with addition of ankle weights with focus on improved control. Will continue per POC.    Personal Factors and Comorbidities Comorbidity 2;Time since onset of injury/illness/exacerbation    Comorbidities HTN, Cerebellar Ataxia    Examination-Activity Limitations Transfers;Stairs;Stand;Locomotion Level    Examination-Participation Restrictions Community Activity;Cleaning    Stability/Clinical Decision Making Evolving/Moderate complexity    Rehab Potential Good    PT Frequency 2x / week    PT Duration 6 weeks    PT Treatment/Interventions ADLs/Self Care Home Management;Aquatic Therapy;Moist Heat;Cryotherapy;DME Instruction;Gait training;Stair training;Functional mobility training;Therapeutic activities;Therapeutic exercise;Balance training;Neuromuscular re-education;Patient/family education;Manual techniques;Passive range of motion    PT Next Visit Plan Finish checking STGs. Work on tall kneeling and coordination tasks, standing balance.Geanie Cooley and Agree with Plan of Care Patient             Patient will benefit from skilled therapeutic intervention in order to improve the following deficits and impairments:  Abnormal gait, Decreased balance, Decreased activity tolerance, Decreased coordination, Decreased knowledge of use of DME, Decreased safety awareness, Decreased strength, Difficulty walking, Decreased endurance  Visit Diagnosis: Unsteadiness on feet  Muscle weakness (generalized)  Other abnormalities of gait and mobility  Other lack of coordination     Problem List Patient Active Problem List   Diagnosis Date Noted   Anterior dislocation of right shoulder 03/01/2016    Jones Bales, PT, DPT 04/13/2021, 2:44 PM  [facility] [address] [place], [place], [address] Phone: [phone]   Fax:   [phone]  Name: Samuel Stanley MRN: [id number] Date of Birth: May 02, 1990

## 2021-04-15 ENCOUNTER — Other Ambulatory Visit: Payer: Self-pay

## 2021-04-15 ENCOUNTER — Ambulatory Visit: Payer: 59

## 2021-04-15 DIAGNOSIS — R2689 Other abnormalities of gait and mobility: Secondary | ICD-10-CM

## 2021-04-15 DIAGNOSIS — R278 Other lack of coordination: Secondary | ICD-10-CM

## 2021-04-15 DIAGNOSIS — R2681 Unsteadiness on feet: Secondary | ICD-10-CM | POA: Diagnosis not present

## 2021-04-15 DIAGNOSIS — M6281 Muscle weakness (generalized): Secondary | ICD-10-CM

## 2021-04-15 NOTE — Therapy (Signed)
[facility] [address] [place], [place], [address] Phone: [phone]   Fax:  [phone]  Physical Therapy Treatment  Patient Details  Name: Samuel Stanley MRN: [id number] Date of Birth: [birth year] Referring Provider (PT): [name], MD (([facility]))   Encounter Date: 04/15/2021   PT End of Session - 04/15/21 1401     Visit Number 8    Number of Visits 13    Date for PT Re-Evaluation 04/29/21    Authorization Type UMR    PT Start Time 1401    PT Stop Time 1442    PT Time Calculation (min) 41 min    Equipment Utilized During Treatment Gait belt    Activity Tolerance Patient tolerated treatment well    Behavior During Therapy WFL for tasks assessed/performed             Past Medical History:  Diagnosis Date   Hypertension     Past Surgical History:  Procedure Laterality Date   HERNIA REPAIR     testicular torsion      There were no vitals filed for this visit.   Subjective Assessment - 04/15/21 1403     Subjective No new changes since was here earlier this week. No pain. No falls.    Pertinent History HTN, Cerebellar Ataxia    Limitations Walking;Standing;House hold activities;Lifting    Diagnostic tests MRI brain from 03/2018 reportedly showed severe cerebellar atrophy    Patient Stated Goals Improve the walking and balance    Currently in Pain? No/denies                [facility] PT Assessment - 04/15/21 0001       Standardized Balance Assessment   Standardized Balance Assessment Berg Balance Test      Berg Balance Test   Sit to Stand Able to stand  independently using hands    Standing Unsupported Able to stand safely 2 minutes    Sitting with Back Unsupported but Feet Supported on Floor or Stool Able to sit safely and securely 2 minutes    Stand to Sit Controls descent by using hands    Transfers Able to transfer safely, definite need of hands    Standing Unsupported with Eyes Closed  Able to stand 10 seconds with supervision    Standing Unsupported with Feet Together Able to place feet together independently and stand for 1 minute with supervision    From Standing, Reach Forward with Outstretched Arm Can reach forward >12 cm safely (5")   9"   From Standing Position, Pick up Object from Floor Able to pick up shoe safely and easily    From Standing Position, Turn to Look Behind Over each Shoulder Turn sideways only but maintains balance    Turn 360 Degrees Able to turn 360 degrees safely one side only in 4 seconds or less    Standing Unsupported, Alternately Place Feet on Step/Stool Needs assistance to keep from falling or unable to try    Standing Unsupported, One Foot in Front Needs help to step but can hold 15 seconds    Standing on One Leg Tries to lift leg/unable to hold 3 seconds but remains standing independently    Total Score 37    Berg comment: 37/56                           [facility] Adult PT Treatment/Exercise - 04/15/21 0001  Transfers   Transfers Sit to Stand;Stand to Sit    Sit to Stand 5: Supervision;4: Min guard    Stand to Sit 5: Supervision;4: Min guard    Comments continued sit <> stand training x 5 reps throughout session, able to complete without cues still supervision.      Ambulation/Gait   Ambulation/Gait Yes    Ambulation/Gait Assistance 5: Supervision;4: Min guard    Ambulation/Gait Assistance Details Completed ambulation with use of Rollator x 400 ft. PT faciliating at pelvis for improved wieght shift to promote stance and step length. Patient continue to demo intermittent increased gait speed and shortned step length requiring cues from PT to pause/reset and focus on those steps again. Intermittent cues required. No rest break needed. PT educating patient and wife on use of rollator outdoors for improved safety.    Ambulation Distance (Feet) 400 Feet    Assistive device Rollator    Gait Pattern Ataxic;Wide base of  support;Decreased step length - right;Decreased step length - left;Decreased stance time - right;Decreased stance time - left    Ambulation Surface Level;Indoor                     PT Education - 04/15/21 1706     Education Details Progress toward STGs; Need for AD on outdoor unlevel surfaces    Person(s) Educated Patient    Methods Explanation    Comprehension Verbalized understanding              PT Short Term Goals - 04/15/21 1446       PT SHORT TERM GOAL #1   Title Patient will be independent with initial HEP for balance/strength (ALL STGs Due: 04/08/21)    Baseline HEP established at prior POC; reports independence with current HEP    Time 3    Period Weeks    Status Achieved    Target Date 04/08/21      PT SHORT TERM GOAL #2   Title Patient will improve Berg Balance to >/= 35/56 to demo improved balance    Baseline 33/56; 37/56    Time 3    Period Weeks    Status Achieved      PT SHORT TERM GOAL #3   Title Patient will demo ability to complete 5x sit <> stand in </= 22 seconds with UE support to demo improved functional strength/mobility    Baseline 26.10 secs with UE support; 32.1 secs with UE support but improved control noted despite increased time    Time 3    Period Weeks    Status Achieved      PT SHORT TERM GOAL #4   Title Patient will demo ability to complete TUG </= 18 seconds to demonstrate reduced fall risk    Baseline 21.08 secs without AD; 15.72 secs without AD    Time 3    Period Weeks    Status Achieved               PT Long Term Goals - 03/11/21 1449       PT LONG TERM GOAL #1   Title Patient will be independent with final HEP for balance/strengthening (ALL LTGs Due: 04/29/21)    Baseline HEP established at prior POC    Time 6    Period Weeks    Status New    Target Date 04/29/21      PT LONG TERM GOAL #2   Title Pt will increase Berg score >/= 40/56 to demonstrate  improved balance and decreased fall risk.     Baseline 33/56    Time 6    Period Weeks    Status New      PT LONG TERM GOAL #3   Title Patient will improve TUG to </= 15 secs with no AD vs. LRAD to demo improved balance    Baseline 21.08 secs    Time 6    Period Weeks    Status New      PT LONG TERM GOAL #4   Title Patient will improve 5x sit<> stand to </= 18 seconds with UE to demo improved functional mobility    Baseline 26.10 secs with UE support    Time 6    Period Weeks    Status New      PT LONG TERM GOAL #5   Title Patient will improve gait speed to >/= 2.5 ft/sec with LRAD to demo improved community ambulation    Baseline 2.21 ft/sec    Time 6    Period Weeks    Status New                   Plan - 04/15/21 1706     Clinical Impression Statement Today's skilled PT session focused on assesment of patient's progress toward STGs. With [employer] Balance patient able to improve to 37/56. Demonstrating progress with balance, but still high fall risk. Rest of session working on gait training longer distance with use of rollator, and faciliating improved weight shift/stance time and step length. CGA. Will continue to progress toward all LTGs.    Personal Factors and Comorbidities Comorbidity 2;Time since onset of injury/illness/exacerbation    Comorbidities HTN, Cerebellar Ataxia    Examination-Activity Limitations Transfers;Stairs;Stand;Locomotion Level    Examination-Participation Restrictions Community Activity;Cleaning    Stability/Clinical Decision Making Evolving/Moderate complexity    Rehab Potential Good    PT Frequency 2x / week    PT Duration 6 weeks    PT Treatment/Interventions ADLs/Self Care Home Management;Aquatic Therapy;Moist Heat;Cryotherapy;DME Instruction;Gait training;Stair training;Functional mobility training;Therapeutic activities;Therapeutic exercise;Balance training;Neuromuscular re-education;Patient/family education;Manual techniques;Passive range of motion    PT Next Visit Plan Gait with  rollator outdoors, step length.  Work on tall kneeling and coordination tasks, standing balance.Earlyne Iba.    Consulted and Agree with Plan of Care Patient             Patient will benefit from skilled therapeutic intervention in order to improve the following deficits and impairments:  Abnormal gait, Decreased balance, Decreased activity tolerance, Decreased coordination, Decreased knowledge of use of DME, Decreased safety awareness, Decreased strength, Difficulty walking, Decreased endurance  Visit Diagnosis: Unsteadiness on feet  Muscle weakness (generalized)  Other abnormalities of gait and mobility  Other lack of coordination     Problem List Patient Active Problem List   Diagnosis Date Noted   Anterior dislocation of right shoulder 03/01/2016    Tempie DonningKaitlyn B Jamyia Fortune, PT, DPT 04/15/2021, 5:11 PM  [facility] [address] [place], [place], [address] Phone: [phone]   Fax:  [phone]  Name: Samuel Stanley MRN: [id number] Date of Birth: 02/14/1991

## 2021-04-20 ENCOUNTER — Other Ambulatory Visit: Payer: Self-pay

## 2021-04-20 ENCOUNTER — Ambulatory Visit: Payer: 59

## 2021-04-20 DIAGNOSIS — M6281 Muscle weakness (generalized): Secondary | ICD-10-CM

## 2021-04-20 DIAGNOSIS — R2689 Other abnormalities of gait and mobility: Secondary | ICD-10-CM | POA: Diagnosis not present

## 2021-04-20 DIAGNOSIS — R2681 Unsteadiness on feet: Secondary | ICD-10-CM

## 2021-04-20 DIAGNOSIS — R278 Other lack of coordination: Secondary | ICD-10-CM | POA: Diagnosis not present

## 2021-04-20 NOTE — Therapy (Signed)
[facility] [address] [place], [place], [address] Phone: [phone]   Fax:  [phone]  Physical Therapy Treatment  Patient Details  Name: Samuel Stanley MRN: [id number] Date of Birth: 1990/09/05 Referring Provider (PT): [name], MD (([facility]))   Encounter Date: 04/20/2021   PT End of Session - 04/20/21 1401     Visit Number 9    Number of Visits 13    Date for PT Re-Evaluation 04/29/21    Authorization Type UMR    PT Start Time 1405   pt having to use bathroom prior to start of session   PT Stop Time 1445    PT Time Calculation (min) 40 min    Equipment Utilized During Treatment Gait belt    Activity Tolerance Patient tolerated treatment well    Behavior During Therapy [facility] for tasks assessed/performed             Past Medical History:  Diagnosis Date   Hypertension     Past Surgical History:  Procedure Laterality Date   HERNIA REPAIR     testicular torsion      There were no vitals filed for this visit.   Subjective Assessment - 04/20/21 1405     Subjective Pt reports no new changes/complaints. No pain or falls. Patient brings in personal rollator today.    Pertinent History HTN, Cerebellar Ataxia    Limitations Walking;Standing;House hold activities;Lifting    Diagnostic tests MRI brain from 03/2018 reportedly showed severe cerebellar atrophy    Patient Stated Goals Improve the walking and balance    Currently in Pain? No/denies                               [facility] Adult PT Treatment/Exercise - 04/20/21 0001       Transfers   Transfers Sit to Stand;Stand to Sit    Sit to Stand 5: Supervision    Stand to Sit 5: Supervision      Ambulation/Gait   Ambulation/Gait Yes    Ambulation/Gait Assistance 5: Supervision;4: Min guard    Ambulation/Gait Assistance Details completed ambulation outdoors on unlevel surfaces with use of Rollator. Completed ambulation on paved  and grass surfaces. No instances of imbalance noted with use of Rollator. Supervision/CGA. Ambulation x 500 ft.    Ambulation Distance (Feet) 500 Feet    Assistive device Rollator    Gait Pattern Ataxic;Wide base of support;Decreased step length - right;Decreased step length - left;Decreased stance time - right;Decreased stance time - left    Ambulation Surface Level;Indoor;Unlevel;Outdoor;Paved    Curb 5: Supervision    Curb Details (indicate cue type and reason) completed ascend/descend curb with use of rollator, PT demonstrating proper completion and proper safe use of rollator. Cues required for brake management.      Neuro Re-ed    Neuro Re-ed Details  PT helped patient transition into tall kneeling position with use of bench, completed maintaining tall kneeling position with EO and no UE support 3 x 30 seconds with CGA, then completed alternating reaching for cones across midline 2 x 7 reps, CGA and cues for weight shift/abdominal engagement. Seated on mat on green air disc wokring on core control/activation: completed trunk rotaitons with 3# weighted ball x 10 reps bilat, then completed alternating marching x 10 reps seated on disc. Pt tolerating well. cues for abdominal activation and cues for technique.  PT Short Term Goals - 04/15/21 1446       PT SHORT TERM GOAL #1   Title Patient will be independent with initial HEP for balance/strength (ALL STGs Due: 04/08/21)    Baseline HEP established at prior POC; reports independence with current HEP    Time 3    Period Weeks    Status Achieved    Target Date 04/08/21      PT SHORT TERM GOAL #2   Title Patient will improve Berg Balance to >/= 35/56 to demo improved balance    Baseline 33/56; 37/56    Time 3    Period Weeks    Status Achieved      PT SHORT TERM GOAL #3   Title Patient will demo ability to complete 5x sit <> stand in </= 22 seconds with UE support to demo improved functional  strength/mobility    Baseline 26.10 secs with UE support; 32.1 secs with UE support but improved control noted despite increased time    Time 3    Period Weeks    Status Achieved      PT SHORT TERM GOAL #4   Title Patient will demo ability to complete TUG </= 18 seconds to demonstrate reduced fall risk    Baseline 21.08 secs without AD; 15.72 secs without AD    Time 3    Period Weeks    Status Achieved               PT Long Term Goals - 03/11/21 1449       PT LONG TERM GOAL #1   Title Patient will be independent with final HEP for balance/strengthening (ALL LTGs Due: 04/29/21)    Baseline HEP established at prior POC    Time 6    Period Weeks    Status New    Target Date 04/29/21      PT LONG TERM GOAL #2   Title Pt will increase Berg score >/= 40/56 to demonstrate improved balance and decreased fall risk.    Baseline 33/56    Time 6    Period Weeks    Status New      PT LONG TERM GOAL #3   Title Patient will improve TUG to </= 15 secs with no AD vs. LRAD to demo improved balance    Baseline 21.08 secs    Time 6    Period Weeks    Status New      PT LONG TERM GOAL #4   Title Patient will improve 5x sit<> stand to </= 18 seconds with UE to demo improved functional mobility    Baseline 26.10 secs with UE support    Time 6    Period Weeks    Status New      PT LONG TERM GOAL #5   Title Patient will improve gait speed to >/= 2.5 ft/sec with LRAD to demo improved community ambulation    Baseline 2.21 ft/sec    Time 6    Period Weeks    Status New                   Plan - 04/20/21 1517     Clinical Impression Statement Completed ambulation outdoors on unlevel surfaces with use of Rollator, patient is supervision/Mod I level with unlevel surfaces. Cues required for proper negotiation of curb with rollator. Continued tall kneeling and seated NMR activity working on improved core activation and coordination. Will continue per POC.  Personal Factors and  Comorbidities Comorbidity 2;Time since onset of injury/illness/exacerbation    Comorbidities HTN, Cerebellar Ataxia    Examination-Activity Limitations Transfers;Stairs;Stand;Locomotion Level    Examination-Participation Restrictions Community Activity;Cleaning    Stability/Clinical Decision Making Evolving/Moderate complexity    Rehab Potential Good    PT Frequency 2x / week    PT Duration 6 weeks    PT Treatment/Interventions ADLs/Self Care Home Management;Aquatic Therapy;Moist Heat;Cryotherapy;DME Instruction;Gait training;Stair training;Functional mobility training;Therapeutic activities;Therapeutic exercise;Balance training;Neuromuscular re-education;Patient/family education;Manual techniques;Passive range of motion    PT Next Visit Plan Gait with rollator outdoors, step length. Work on tall kneeling and coordination tasks, standing balance.Earlyne Iba and Agree with Plan of Care Patient             Patient will benefit from skilled therapeutic intervention in order to improve the following deficits and impairments:  Abnormal gait, Decreased balance, Decreased activity tolerance, Decreased coordination, Decreased knowledge of use of DME, Decreased safety awareness, Decreased strength, Difficulty walking, Decreased endurance  Visit Diagnosis: Unsteadiness on feet  Muscle weakness (generalized)  Other abnormalities of gait and mobility  Other lack of coordination     Problem List Patient Active Problem List   Diagnosis Date Noted   Anterior dislocation of right shoulder 03/01/2016    Tempie Donning, PT, DPT 04/20/2021, 3:19 PM  [facility] [address] [place], [place], [address] Phone: [phone]   Fax:  [phone]  Name: Samuel Stanley MRN: [id number] Date of Birth: [birth year]

## 2021-04-22 ENCOUNTER — Other Ambulatory Visit: Payer: Self-pay

## 2021-04-22 ENCOUNTER — Ambulatory Visit: Payer: 59

## 2021-04-22 DIAGNOSIS — R278 Other lack of coordination: Secondary | ICD-10-CM | POA: Diagnosis not present

## 2021-04-22 DIAGNOSIS — R2681 Unsteadiness on feet: Secondary | ICD-10-CM | POA: Diagnosis not present

## 2021-04-22 DIAGNOSIS — M6281 Muscle weakness (generalized): Secondary | ICD-10-CM

## 2021-04-22 DIAGNOSIS — R2689 Other abnormalities of gait and mobility: Secondary | ICD-10-CM

## 2021-04-22 NOTE — Therapy (Signed)
[facility] [address] [place], [place], [address] Phone: [phone]   Fax:  [phone]  Physical Therapy Treatment  Patient Details  Name: Samuel Stanley MRN: [id number] Date of Birth: [birth year] Referring Provider (PT): [name], MD (([facility]))   Encounter Date: 04/22/2021   PT End of Session - 04/22/21 1401     Visit Number 10    Number of Visits 13    Date for PT Re-Evaluation 04/29/21    Authorization Type UMR    PT Start Time 1400    PT Stop Time 1444    PT Time Calculation (min) 44 min    Equipment Utilized During Treatment Gait belt    Activity Tolerance Patient tolerated treatment well    Behavior During Therapy WFL for tasks assessed/performed             Past Medical History:  Diagnosis Date   Hypertension     Past Surgical History:  Procedure Laterality Date   HERNIA REPAIR     testicular torsion      There were no vitals filed for this visit.   Subjective Assessment - 04/22/21 1403     Subjective No new changes/complaints. No falls. Reports felt good after the session the other day.    Pertinent History HTN, Cerebellar Ataxia    Limitations Walking;Standing;House hold activities;Lifting    Diagnostic tests MRI brain from 03/2018 reportedly showed severe cerebellar atrophy    Patient Stated Goals Improve the walking and balance    Currently in Pain? No/denies                [facility] Adult PT Treatment/Exercise - 04/22/21 0001       Transfers   Transfers Sit to Stand;Stand to Sit    Sit to Stand 5: Supervision    Stand to Sit 5: Supervision    Comments completed sit <> stands with rollator placed in front of patient, completed x 5 reps with BUE support and maintain balance upon standing. Then completed additional 10 reps with step forward/back with UE support working on transition into ambulation. Pt tolerating well.      Ambulation/Gait   Ambulation/Gait Yes     Ambulation/Gait Assistance 5: Supervision;4: Min guard    Ambulation/Gait Assistance Details Continued gait training with rollator, working on improved step length and reciprocal stpe length vs. small short steps. One isntance of ability to ambulte approx 45' with reciprocal equal step length with PT faciliation at pelvis.    Ambulation Distance (Feet) 300 Feet    Assistive device Rollator    Gait Pattern Ataxic;Wide base of support;Decreased step length - right;Decreased step length - left;Decreased stance time - right;Decreased stance time - left    Ambulation Surface Level;Indoor      Neuro Re-ed    Neuro Re-ed Details  At countertop with cues to keep feet positioned in square, completed anterior/posterior steps forward to target bilat alternating, complted x 10 reps. Then completed lateral side stepping to target x 10 reps. trnasition from taking multiple small steps to keeping feet grounded and taking one lateral step. Single UE support required. Standing with single UE support completed alternating marching working on improved upright tall posture and core activation x 10 reps bilat.      Exercises   Exercises Knee/Hip      Knee/Hip Exercises: Aerobic   Nustep Completed NuStep with BLE/BUE Patient able to complete proper adjustment of machine without cues from PT. Completed on Level 6  x 7 minutes. Pt tolerating increased resitance and time well.                       PT Short Term Goals - 04/15/21 1446       PT SHORT TERM GOAL #1   Title Patient will be independent with initial HEP for balance/strength (ALL STGs Due: 04/08/21)    Baseline HEP established at prior POC; reports independence with current HEP    Time 3    Period Weeks    Status Achieved    Target Date 04/08/21      PT SHORT TERM GOAL #2   Title Patient will improve Berg Balance to >/= 35/56 to demo improved balance    Baseline 33/56; 37/56    Time 3    Period Weeks    Status Achieved      PT SHORT  TERM GOAL #3   Title Patient will demo ability to complete 5x sit <> stand in </= 22 seconds with UE support to demo improved functional strength/mobility    Baseline 26.10 secs with UE support; 32.1 secs with UE support but improved control noted despite increased time    Time 3    Period Weeks    Status Achieved      PT SHORT TERM GOAL #4   Title Patient will demo ability to complete TUG </= 18 seconds to demonstrate reduced fall risk    Baseline 21.08 secs without AD; 15.72 secs without AD    Time 3    Period Weeks    Status Achieved               PT Long Term Goals - 03/11/21 1449       PT LONG TERM GOAL #1   Title Patient will be independent with final HEP for balance/strengthening (ALL LTGs Due: 04/29/21)    Baseline HEP established at prior POC    Time 6    Period Weeks    Status New    Target Date 04/29/21      PT LONG TERM GOAL #2   Title Pt will increase Berg score >/= 40/56 to demonstrate improved balance and decreased fall risk.    Baseline 33/56    Time 6    Period Weeks    Status New      PT LONG TERM GOAL #3   Title Patient will improve TUG to </= 15 secs with no AD vs. LRAD to demo improved balance    Baseline 21.08 secs    Time 6    Period Weeks    Status New      PT LONG TERM GOAL #4   Title Patient will improve 5x sit<> stand to </= 18 seconds with UE to demo improved functional mobility    Baseline 26.10 secs with UE support    Time 6    Period Weeks    Status New      PT LONG TERM GOAL #5   Title Patient will improve gait speed to >/= 2.5 ft/sec with LRAD to demo improved community ambulation    Baseline 2.21 ft/sec    Time 6    Period Weeks    Status New                   Plan - 04/22/21 1536     Clinical Impression Statement Continued gait trianing working on improved reciprocal step length with equal step length, vs shortedned step.  Continued to encourage use of AD. Continued anterior and lateral stepping strategy, as  well as sit <> stands with transition step forward to transition into ambulation. Will continue per POC.    Personal Factors and Comorbidities Comorbidity 2;Time since onset of injury/illness/exacerbation    Comorbidities HTN, Cerebellar Ataxia    Examination-Activity Limitations Transfers;Stairs;Stand;Locomotion Level    Examination-Participation Restrictions Community Activity;Cleaning    Stability/Clinical Decision Making Evolving/Moderate complexity    Rehab Potential Good    PT Frequency 2x / week    PT Duration 6 weeks    PT Treatment/Interventions ADLs/Self Care Home Management;Aquatic Therapy;Moist Heat;Cryotherapy;DME Instruction;Gait training;Stair training;Functional mobility training;Therapeutic activities;Therapeutic exercise;Balance training;Neuromuscular re-education;Patient/family education;Manual techniques;Passive range of motion    PT Next Visit Plan Review and Update HEP. Plan to d/c next week. Gait with rollator outdoors, step length. Work on tall kneeling and coordination tasks, standing balance.Earlyne Iba and Agree with Plan of Care Patient             Patient will benefit from skilled therapeutic intervention in order to improve the following deficits and impairments:  Abnormal gait, Decreased balance, Decreased activity tolerance, Decreased coordination, Decreased knowledge of use of DME, Decreased safety awareness, Decreased strength, Difficulty walking, Decreased endurance  Visit Diagnosis: Unsteadiness on feet  Muscle weakness (generalized)  Other abnormalities of gait and mobility  Other lack of coordination     Problem List Patient Active Problem List   Diagnosis Date Noted   Anterior dislocation of right shoulder 03/01/2016    Tempie Donning, PT, DPT 04/22/2021, 3:38 PM  [facility] [address] [place], [place], [address] Phone: [phone]   Fax:  [phone]  Name:  Samuel Stanley MRN: [id number] Date of Birth: August 22, 1990

## 2021-04-27 ENCOUNTER — Ambulatory Visit: Payer: 59

## 2021-04-27 ENCOUNTER — Other Ambulatory Visit: Payer: Self-pay

## 2021-04-27 DIAGNOSIS — R278 Other lack of coordination: Secondary | ICD-10-CM | POA: Diagnosis not present

## 2021-04-27 DIAGNOSIS — R2681 Unsteadiness on feet: Secondary | ICD-10-CM | POA: Diagnosis not present

## 2021-04-27 DIAGNOSIS — R2689 Other abnormalities of gait and mobility: Secondary | ICD-10-CM | POA: Diagnosis not present

## 2021-04-27 DIAGNOSIS — M6281 Muscle weakness (generalized): Secondary | ICD-10-CM | POA: Diagnosis not present

## 2021-04-27 NOTE — Patient Instructions (Signed)
Access Code: [email or web address] URL: [email or web address] Date: 04/27/2021 Prepared by: Jethro Bastos  Program Notes Lateral Step Outs:   - At the counter with light support, with one leg step out to the side to target x 10 reps, keeping the other leg stationary. Then complete x 10 reps with the opposite leg.    Exercises Seated March with Opposite Side Arm Raise - 1 x daily - 5 x weekly - 1 sets - 10 reps Quadruped Alternating Arm Lift - 1 x daily - 5 x weekly - 1 sets - 10 reps Tall Kneeling Hip Hinge - 1 x daily - 5 x weekly - 2 sets - 10 reps Tall Kneeling Diagonal Lift with Medicine Ball - 1 x daily - 5 x weekly - 2 sets - 10 reps Supine Bridge with Resistance Band - 1 x daily - 5 x weekly - 2 sets - 10 reps

## 2021-04-27 NOTE — Therapy (Signed)
[facility] [address] [place], [place], [address] Phone: [phone]   Fax:  [phone]  Physical Therapy Treatment  Patient Details  Name: Samuel Stanley MRN: [id number] Date of Birth: Jan 10, 1991 Referring Provider (PT): [name], MD (([facility]))   Encounter Date: 04/27/2021   PT End of Session - 04/27/21 1403     Visit Number 11    Number of Visits 13    Date for PT Re-Evaluation 04/29/21    Authorization Type UMR    PT Start Time 1401    PT Stop Time 1447    PT Time Calculation (min) 46 min    Equipment Utilized During Treatment Gait belt    Activity Tolerance Patient tolerated treatment well    Behavior During Therapy WFL for tasks assessed/performed             Past Medical History:  Diagnosis Date   Hypertension     Past Surgical History:  Procedure Laterality Date   HERNIA REPAIR     testicular torsion      There were no vitals filed for this visit.   Subjective Assessment - 04/27/21 1402     Subjective Patient reports no new changes/complaints. No pains/falls to report.    Pertinent History HTN, Cerebellar Ataxia    Limitations Walking;Standing;House hold activities;Lifting    Diagnostic tests MRI brain from 03/2018 reportedly showed severe cerebellar atrophy    Patient Stated Goals Improve the walking and balance    Currently in Pain? No/denies              [facility] Adult PT Treatment/Exercise - 04/27/21 0001       Transfers   Transfers Sit to Stand;Stand to Sit;Floor to Transfer    Sit to Stand 5: Supervision    Stand to Sit 5: Supervision    Floor to Transfer 5: Supervision    Floor to Transfer Details (indicate cue type and reason) completed mat <> floor transfer x 2 reps, supervision level to simulate getting on/off floor at home      Ambulation/Gait   Ambulation/Gait Yes    Ambulation/Gait Assistance 5: Supervision    Ambulation/Gait Assistance Details Completed  ambulation outdoors on unlevel surfaces, as well as indoor surfaces with use of Rollator    Ambulation Distance (Feet) 400 Feet    Assistive device Rollator    Gait Pattern Ataxic;Wide base of support;Decreased step length - right;Decreased step length - left;Decreased stance time - right;Decreased stance time - left    Ambulation Surface Level;Indoor    Curb 5: Supervision    Curb Details (indicate cue type and reason) Continued curb negotiation with rollator outdoors, completed x 2 reps. Pt able to demo proper sequencing and brake management without cues from PT.             Completed all of the following exercises during session as review of current HEP, and progressed to patient's tolerance. Bolded are new additions/progressions of exercises added to HEP.   Access Code: [email or web address] URL: [email or web address] Date: 04/27/2021 Prepared by: Jethro Bastos   Program Notes Lateral Step Outs:    - At the counter with light support, with one leg step out to the side to target x 10 reps, keeping the other leg stationary. Then complete x 10 reps with the opposite leg.      Exercises Seated March with Opposite Side Arm Raise - 1 x daily - 5 x weekly - 1 sets - 10 reps Quadruped Alternating  Arm Lift - 1 x daily - 5 x weekly - 1 sets - 10 reps Tall Kneeling Hip Hinge - 1 x daily - 5 x weekly - 2 sets - 10 reps Tall Kneeling Diagonal Lift with Medicine Ball - 1 x daily - 5 x weekly - 2 sets - 10 reps Supine Bridge with Resistance Band - 1 x daily - 5 x weekly - 2 sets - 10 reps     PT Education - 04/27/21 1449     Education Details HEP Update    Person(s) Educated Patient;Other (comment)   Grandmother   Methods Explanation;Demonstration;Handout    Comprehension Verbalized understanding;Returned demonstration              PT Short Term Goals - 04/15/21 1446       PT SHORT TERM GOAL #1   Title Patient will be independent with initial HEP for balance/strength (ALL  STGs Due: 04/08/21)    Baseline HEP established at prior POC; reports independence with current HEP    Time 3    Period Weeks    Status Achieved    Target Date 04/08/21      PT SHORT TERM GOAL #2   Title Patient will improve Berg Balance to >/= 35/56 to demo improved balance    Baseline 33/56; 37/56    Time 3    Period Weeks    Status Achieved      PT SHORT TERM GOAL #3   Title Patient will demo ability to complete 5x sit <> stand in </= 22 seconds with UE support to demo improved functional strength/mobility    Baseline 26.10 secs with UE support; 32.1 secs with UE support but improved control noted despite increased time    Time 3    Period Weeks    Status Achieved      PT SHORT TERM GOAL #4   Title Patient will demo ability to complete TUG </= 18 seconds to demonstrate reduced fall risk    Baseline 21.08 secs without AD; 15.72 secs without AD    Time 3    Period Weeks    Status Achieved               PT Long Term Goals - 03/11/21 1449       PT LONG TERM GOAL #1   Title Patient will be independent with final HEP for balance/strengthening (ALL LTGs Due: 04/29/21)    Baseline HEP established at prior POC    Time 6    Period Weeks    Status New    Target Date 04/29/21      PT LONG TERM GOAL #2   Title Pt will increase Berg score >/= 40/56 to demonstrate improved balance and decreased fall risk.    Baseline 33/56    Time 6    Period Weeks    Status New      PT LONG TERM GOAL #3   Title Patient will improve TUG to </= 15 secs with no AD vs. LRAD to demo improved balance    Baseline 21.08 secs    Time 6    Period Weeks    Status New      PT LONG TERM GOAL #4   Title Patient will improve 5x sit<> stand to </= 18 seconds with UE to demo improved functional mobility    Baseline 26.10 secs with UE support    Time 6    Period Weeks    Status New  PT LONG TERM GOAL #5   Title Patient will improve gait speed to >/= 2.5 ft/sec with LRAD to demo improved  community ambulation    Baseline 2.21 ft/sec    Time 6    Period Weeks    Status New                   Plan - 04/27/21 1455     Clinical Impression Statement Continued gait outdoors with RW, with continued practice of curb negotiation, patient able to complete properly today without cues from PT. Rest of session spent reviewing and updating HEP to patient's tolerance, also simulated mat <> floor transfer with supervision level to allow for patient to get on/off floor at home for HEP completion. Will continue per POC and planned d/c at next visit.    Personal Factors and Comorbidities Comorbidity 2;Time since onset of injury/illness/exacerbation    Comorbidities HTN, Cerebellar Ataxia    Examination-Activity Limitations Transfers;Stairs;Stand;Locomotion Level    Examination-Participation Restrictions Community Activity;Cleaning    Stability/Clinical Decision Making Evolving/Moderate complexity    Rehab Potential Good    PT Frequency 2x / week    PT Duration 6 weeks    PT Treatment/Interventions ADLs/Self Care Home Management;Aquatic Therapy;Moist Heat;Cryotherapy;DME Instruction;Gait training;Stair training;Functional mobility training;Therapeutic activities;Therapeutic exercise;Balance training;Neuromuscular re-education;Patient/family education;Manual techniques;Passive range of motion    PT Next Visit Plan Plan to d/c. Gait with rollator outdoors, step length. Work on tall kneeling and coordination tasks, standing balance.Earlyne [place] and Agree with Plan of Care Patient             Patient will benefit from skilled therapeutic intervention in order to improve the following deficits and impairments:  Abnormal gait, Decreased balance, Decreased activity tolerance, Decreased coordination, Decreased knowledge of use of DME, Decreased safety awareness, Decreased strength, Difficulty walking, Decreased endurance  Visit Diagnosis: Unsteadiness on feet  Muscle weakness  (generalized)  Other abnormalities of gait and mobility  Other lack of coordination     Problem List Patient Active Problem List   Diagnosis Date Noted   Anterior dislocation of right shoulder 03/01/2016    Tempie Donning, PT, DPT 04/27/2021, 2:57 PM  [place] [facility] [address] [place], [place], [address] Phone: [phone]   Fax:  [phone]  Name: Samuel Stanley MRN: [id number] Date of Birth: 09-Feb-1991

## 2021-04-29 ENCOUNTER — Ambulatory Visit: Payer: 59

## 2021-04-29 ENCOUNTER — Other Ambulatory Visit: Payer: Self-pay

## 2021-04-29 DIAGNOSIS — R278 Other lack of coordination: Secondary | ICD-10-CM

## 2021-04-29 DIAGNOSIS — R2681 Unsteadiness on feet: Secondary | ICD-10-CM

## 2021-04-29 DIAGNOSIS — R2689 Other abnormalities of gait and mobility: Secondary | ICD-10-CM

## 2021-04-29 DIAGNOSIS — M6281 Muscle weakness (generalized): Secondary | ICD-10-CM

## 2021-04-29 NOTE — Therapy (Signed)
[place] [address] [place], [place], [address] Phone: [phone]   Fax:  [phone]  Physical Therapy Treatment/Discharge Summary  Patient Details  Name: Samuel Stanley MRN: [id number] Date of Birth: [birth year] Referring Provider (PT): Luella Cook, MD (([facility]))  PHYSICAL THERAPY DISCHARGE SUMMARY  Visits from Start of Care: 12  Current functional level related to goals / functional outcomes: See Clinical Impression Statement   Remaining deficits: Coordination Deficits, Abnormal Gait   Education / Equipment: HEP Provided; Fall Prevention Handout   Patient agrees to discharge. Patient goals were partially met. Patient is being discharged due to meeting the stated rehab goals.   Encounter Date: 04/29/2021   PT End of Session - 04/29/21 1359     Visit Number 12    Number of Visits 13    Date for PT Re-Evaluation 04/29/21    Authorization Type UMR    PT Start Time 1400    PT Stop Time 1435    PT Time Calculation (min) 35 min    Equipment Utilized During Treatment Gait belt    Activity Tolerance Patient tolerated treatment well    Behavior During Therapy WFL for tasks assessed/performed             Past Medical History:  Diagnosis Date   Hypertension     Past Surgical History:  Procedure Laterality Date   HERNIA REPAIR     testicular torsion      There were no vitals filed for this visit.   Subjective Assessment - 04/29/21 1402     Subjective No new changes/complaints. No falls or trips to report. No pain.    Pertinent History HTN, Cerebellar Ataxia    Limitations Walking;Standing;House hold activities;Lifting    Diagnostic tests MRI brain from 03/2018 reportedly showed severe cerebellar atrophy    Patient Stated Goals Improve the walking and balance    Currently in Pain? No/denies                [facility] PT Assessment - 04/29/21 0001       Ambulation/Gait   Ambulation/Gait  Yes    Ambulation/Gait Assistance 5: Supervision    Assistive device Rollator    Gait Pattern Ataxic;Wide base of support;Decreased step length - right;Decreased step length - left;Decreased stance time - right;Decreased stance time - left    Ambulation Surface Level;Indoor    Gait velocity 14.06 secs = 2.33 ft/sec      Standardized Balance Assessment   Standardized Balance Assessment Berg Balance Test;Timed Up and Go Test      Berg Balance Test   Sit to Stand Able to stand without using hands and stabilize independently    Standing Unsupported Able to stand safely 2 minutes    Sitting with Back Unsupported but Feet Supported on Floor or Stool Able to sit safely and securely 2 minutes    Stand to Sit Controls descent by using hands    Transfers Able to transfer safely, minor use of hands    Standing Unsupported with Eyes Closed Able to stand 10 seconds safely    Standing Unsupported with Feet Together Able to place feet together independently and stand for 1 minute with supervision    From Standing, Reach Forward with Outstretched Arm Can reach confidently >25 cm (10")    From Standing Position, Pick up Object from Floor Able to pick up shoe safely and easily    From Standing Position, Turn to Look Behind Over each Shoulder Turn  sideways only but maintains balance    Turn 360 Degrees Able to turn 360 degrees safely but slowly    Standing Unsupported, Alternately Place Feet on Step/Stool Needs assistance to keep from falling or unable to try    Standing Unsupported, One Foot in [place] to take small step independently and hold 30 seconds    Standing on One Leg Tries to lift leg/unable to hold 3 seconds but remains standing independently    Total Score 41    Berg comment: 41/56      Timed Up and Go Test   TUG Normal TUG    Normal TUG (seconds) 14.34    TUG Comments with use of Rollator              [facility] Adult PT Treatment/Exercise - 04/29/21 0001       Transfers    Transfers Sit to Stand;Stand to Sit;Floor to Transfer    Sit to Stand 5: Supervision    Five time sit to stand comments  29.8 secs from mat at standard chair height with UE support    Stand to Sit 5: Supervision      Neuro Re-ed    Neuro Re-ed Details  Verbal review of HEP, no questions/concerns at this time. PT educating patient and wife on fall prevention within the home to promote improved safety and reduced fall risk.                     PT Education - 04/29/21 1400     Education Details Progress toward LTGs; Fall Prevention Handout    Person(s) Educated Patient    Methods Explanation;Handout    Comprehension Verbalized understanding              PT Short Term Goals - 04/15/21 1446       PT SHORT TERM GOAL #1   Title Patient will be independent with initial HEP for balance/strength (ALL STGs Due: 04/08/21)    Baseline HEP established at prior POC; reports independence with current HEP    Time 3    Period Weeks    Status Achieved    Target Date 04/08/21      PT SHORT TERM GOAL #2   Title Patient will improve Berg Balance to >/= 35/56 to demo improved balance    Baseline 33/56; 37/56    Time 3    Period Weeks    Status Achieved      PT SHORT TERM GOAL #3   Title Patient will demo ability to complete 5x sit <> stand in </= 22 seconds with UE support to demo improved functional strength/mobility    Baseline 26.10 secs with UE support; 32.1 secs with UE support but improved control noted despite increased time    Time 3    Period Weeks    Status Achieved      PT SHORT TERM GOAL #4   Title Patient will demo ability to complete TUG </= 18 seconds to demonstrate reduced fall risk    Baseline 21.08 secs without AD; 15.72 secs without AD    Time 3    Period Weeks    Status Achieved               PT Long Term Goals - 04/29/21 1403       PT LONG TERM GOAL #1   Title Patient will be independent with final HEP for balance/strengthening (ALL LTGs Due:  04/29/21)    Baseline HEP established  at prior POC; Indepdenct with HEP    Time 6    Period Weeks    Status Achieved    Target Date 04/29/21      PT LONG TERM GOAL #2   Title Pt will increase Berg score >/= 40/56 to demonstrate improved balance and decreased fall risk.    Baseline 33/56; 41/56    Time 6    Period Weeks    Status Achieved      PT LONG TERM GOAL #3   Title Patient will improve TUG to </= 15 secs with no AD vs. LRAD to demo improved balance    Baseline 21.08 secs; 14.34 secs    Time 6    Period Weeks    Status Achieved      PT LONG TERM GOAL #4   Title Patient will improve 5x sit<> stand to </= 18 seconds with UE to demo improved functional mobility    Baseline 26.10 secs with UE support; 29.8 secs with UE support    Time 6    Period Weeks    Status Not Met      PT LONG TERM GOAL #5   Title Patient will improve gait speed to >/= 2.5 ft/sec with LRAD to demo improved community ambulation    Baseline 2.21 ft/sec; 2.33 ft/sec    Time 6    Period Weeks    Status Not Met                   Plan - 04/29/21 1446     Clinical Impression Statement Completed assesment of patient's progress toward LTGs. Patient able to meet LTG #1 -3, demonstrating improved balance with 41/56 on Berg Balance and TUG to 14.34 secs. Patient made progress toward LTG 4 and 5 but not to goal level. Patient has made progress with PT services and demo readiness to d/c at this. PT educating on fall prevention safety and compliance with HEP.    Personal Factors and Comorbidities Comorbidity 2;Time since onset of injury/illness/exacerbation    Comorbidities HTN, Cerebellar Ataxia    Examination-Activity Limitations Transfers;Stairs;Stand;Locomotion Level    Examination-Participation Restrictions Community Activity;Cleaning    Stability/Clinical Decision Making Evolving/Moderate complexity    Rehab Potential Good    PT Frequency 2x / week    PT Duration 6 weeks    PT  Treatment/Interventions ADLs/Self Care Home Management;Aquatic Therapy;Moist Heat;Cryotherapy;DME Instruction;Gait training;Stair training;Functional mobility training;Therapeutic activities;Therapeutic exercise;Balance training;Neuromuscular re-education;Patient/family education;Manual techniques;Passive range of motion    PT Next Visit Plan d/c this visit    Consulted and Agree with Plan of Care Patient             Patient will benefit from skilled therapeutic intervention in order to improve the following deficits and impairments:  Abnormal gait, Decreased balance, Decreased activity tolerance, Decreased coordination, Decreased knowledge of use of DME, Decreased safety awareness, Decreased strength, Difficulty walking, Decreased endurance  Visit Diagnosis: Unsteadiness on feet  Muscle weakness (generalized)  Other abnormalities of gait and mobility  Other lack of coordination     Problem List Patient Active Problem List   Diagnosis Date Noted   Anterior dislocation of right shoulder 03/01/2016    Jones Bales, PT, DPT 04/29/2021, 2:51 PM  [place] [address] [place], [place], [address] Phone: [phone]   Fax:  [phone]  Name: Raygen Dahm MRN: [id number] Date of Birth: 20-Mar-1990

## 2021-05-03 DIAGNOSIS — Z6833 Body mass index (BMI) 33.0-33.9, adult: Secondary | ICD-10-CM | POA: Diagnosis not present

## 2021-05-03 DIAGNOSIS — F331 Major depressive disorder, recurrent, moderate: Secondary | ICD-10-CM | POA: Diagnosis not present

## 2021-05-03 DIAGNOSIS — E538 Deficiency of other specified B group vitamins: Secondary | ICD-10-CM | POA: Diagnosis not present

## 2021-05-03 DIAGNOSIS — G319 Degenerative disease of nervous system, unspecified: Secondary | ICD-10-CM | POA: Diagnosis not present

## 2021-06-27 ENCOUNTER — Other Ambulatory Visit (HOSPITAL_COMMUNITY): Payer: Self-pay

## 2021-07-18 DIAGNOSIS — D519 Vitamin B12 deficiency anemia, unspecified: Secondary | ICD-10-CM | POA: Diagnosis not present

## 2021-07-18 DIAGNOSIS — D649 Anemia, unspecified: Secondary | ICD-10-CM | POA: Diagnosis not present

## 2021-07-18 DIAGNOSIS — Z1329 Encounter for screening for other suspected endocrine disorder: Secondary | ICD-10-CM | POA: Diagnosis not present

## 2021-07-18 DIAGNOSIS — I10 Essential (primary) hypertension: Secondary | ICD-10-CM | POA: Diagnosis not present

## 2021-07-18 DIAGNOSIS — Z13220 Encounter for screening for lipoid disorders: Secondary | ICD-10-CM | POA: Diagnosis not present

## 2021-07-18 DIAGNOSIS — R030 Elevated blood-pressure reading, without diagnosis of hypertension: Secondary | ICD-10-CM | POA: Diagnosis not present

## 2021-07-18 DIAGNOSIS — D529 Folate deficiency anemia, unspecified: Secondary | ICD-10-CM | POA: Diagnosis not present

## 2021-07-25 DIAGNOSIS — Z6832 Body mass index (BMI) 32.0-32.9, adult: Secondary | ICD-10-CM | POA: Diagnosis not present

## 2021-07-25 DIAGNOSIS — G319 Degenerative disease of nervous system, unspecified: Secondary | ICD-10-CM | POA: Diagnosis not present

## 2021-07-25 DIAGNOSIS — F331 Major depressive disorder, recurrent, moderate: Secondary | ICD-10-CM | POA: Diagnosis not present

## 2021-07-25 DIAGNOSIS — E538 Deficiency of other specified B group vitamins: Secondary | ICD-10-CM | POA: Diagnosis not present

## 2021-09-14 ENCOUNTER — Other Ambulatory Visit (HOSPITAL_COMMUNITY): Payer: Self-pay

## 2021-09-14 DIAGNOSIS — G319 Degenerative disease of nervous system, unspecified: Secondary | ICD-10-CM | POA: Diagnosis not present

## 2021-09-14 DIAGNOSIS — F331 Major depressive disorder, recurrent, moderate: Secondary | ICD-10-CM | POA: Diagnosis not present

## 2021-09-14 DIAGNOSIS — E538 Deficiency of other specified B group vitamins: Secondary | ICD-10-CM | POA: Diagnosis not present

## 2021-09-14 DIAGNOSIS — Z6833 Body mass index (BMI) 33.0-33.9, adult: Secondary | ICD-10-CM | POA: Diagnosis not present

## 2021-09-14 MED ORDER — DIVALPROEX SODIUM ER 500 MG PO TB24
500.0000 mg | ORAL_TABLET | Freq: Every day | ORAL | 1 refills | Status: AC
  Filled 2021-09-14: qty 90, 90d supply, fill #0
  Filled 2022-05-13: qty 90, 90d supply, fill #1

## 2021-09-25 ENCOUNTER — Other Ambulatory Visit (HOSPITAL_COMMUNITY): Payer: Self-pay

## 2021-09-25 MED ORDER — DULOXETINE HCL 60 MG PO CPEP
60.0000 mg | ORAL_CAPSULE | Freq: Every day | ORAL | 1 refills | Status: DC
  Filled 2021-09-25 – 2021-10-10 (×2): qty 90, 90d supply, fill #0
  Filled 2021-12-24: qty 90, 90d supply, fill #1

## 2021-09-26 ENCOUNTER — Other Ambulatory Visit (HOSPITAL_COMMUNITY): Payer: Self-pay

## 2021-10-04 ENCOUNTER — Other Ambulatory Visit (HOSPITAL_COMMUNITY): Payer: Self-pay

## 2021-10-10 ENCOUNTER — Other Ambulatory Visit (HOSPITAL_COMMUNITY): Payer: Self-pay

## 2021-12-20 DIAGNOSIS — E538 Deficiency of other specified B group vitamins: Secondary | ICD-10-CM | POA: Diagnosis not present

## 2021-12-20 DIAGNOSIS — I10 Essential (primary) hypertension: Secondary | ICD-10-CM | POA: Diagnosis not present

## 2021-12-20 DIAGNOSIS — D529 Folate deficiency anemia, unspecified: Secondary | ICD-10-CM | POA: Diagnosis not present

## 2021-12-20 DIAGNOSIS — Z13220 Encounter for screening for lipoid disorders: Secondary | ICD-10-CM | POA: Diagnosis not present

## 2021-12-20 DIAGNOSIS — D649 Anemia, unspecified: Secondary | ICD-10-CM | POA: Diagnosis not present

## 2021-12-26 ENCOUNTER — Other Ambulatory Visit (HOSPITAL_COMMUNITY): Payer: Self-pay

## 2021-12-27 DIAGNOSIS — Z0001 Encounter for general adult medical examination with abnormal findings: Secondary | ICD-10-CM | POA: Diagnosis not present

## 2021-12-27 DIAGNOSIS — Z23 Encounter for immunization: Secondary | ICD-10-CM | POA: Diagnosis not present

## 2021-12-27 DIAGNOSIS — F331 Major depressive disorder, recurrent, moderate: Secondary | ICD-10-CM | POA: Diagnosis not present

## 2021-12-27 DIAGNOSIS — E538 Deficiency of other specified B group vitamins: Secondary | ICD-10-CM | POA: Diagnosis not present

## 2021-12-27 DIAGNOSIS — G319 Degenerative disease of nervous system, unspecified: Secondary | ICD-10-CM | POA: Diagnosis not present

## 2021-12-27 DIAGNOSIS — Z6834 Body mass index (BMI) 34.0-34.9, adult: Secondary | ICD-10-CM | POA: Diagnosis not present

## 2021-12-27 DIAGNOSIS — R030 Elevated blood-pressure reading, without diagnosis of hypertension: Secondary | ICD-10-CM | POA: Diagnosis not present

## 2021-12-27 DIAGNOSIS — E7849 Other hyperlipidemia: Secondary | ICD-10-CM | POA: Diagnosis not present

## 2022-01-23 DIAGNOSIS — G119 Hereditary ataxia, unspecified: Secondary | ICD-10-CM | POA: Diagnosis not present

## 2022-01-23 DIAGNOSIS — E538 Deficiency of other specified B group vitamins: Secondary | ICD-10-CM | POA: Diagnosis not present

## 2022-04-26 DIAGNOSIS — E7849 Other hyperlipidemia: Secondary | ICD-10-CM | POA: Diagnosis not present

## 2022-04-26 DIAGNOSIS — G319 Degenerative disease of nervous system, unspecified: Secondary | ICD-10-CM | POA: Diagnosis not present

## 2022-04-26 DIAGNOSIS — I10 Essential (primary) hypertension: Secondary | ICD-10-CM | POA: Diagnosis not present

## 2022-05-02 DIAGNOSIS — Z6833 Body mass index (BMI) 33.0-33.9, adult: Secondary | ICD-10-CM | POA: Diagnosis not present

## 2022-05-02 DIAGNOSIS — G319 Degenerative disease of nervous system, unspecified: Secondary | ICD-10-CM | POA: Diagnosis not present

## 2022-05-02 DIAGNOSIS — E538 Deficiency of other specified B group vitamins: Secondary | ICD-10-CM | POA: Diagnosis not present

## 2022-05-02 DIAGNOSIS — F331 Major depressive disorder, recurrent, moderate: Secondary | ICD-10-CM | POA: Diagnosis not present

## 2022-05-02 DIAGNOSIS — E7849 Other hyperlipidemia: Secondary | ICD-10-CM | POA: Diagnosis not present

## 2022-05-02 DIAGNOSIS — R030 Elevated blood-pressure reading, without diagnosis of hypertension: Secondary | ICD-10-CM | POA: Diagnosis not present

## 2022-05-04 ENCOUNTER — Other Ambulatory Visit: Payer: Self-pay

## 2022-05-04 ENCOUNTER — Ambulatory Visit (HOSPITAL_COMMUNITY): Payer: Commercial Managed Care - PPO | Attending: Family Medicine

## 2022-05-04 DIAGNOSIS — R270 Ataxia, unspecified: Secondary | ICD-10-CM

## 2022-05-04 DIAGNOSIS — R2689 Other abnormalities of gait and mobility: Secondary | ICD-10-CM | POA: Diagnosis not present

## 2022-05-04 DIAGNOSIS — R262 Difficulty in walking, not elsewhere classified: Secondary | ICD-10-CM | POA: Diagnosis not present

## 2022-05-04 DIAGNOSIS — R278 Other lack of coordination: Secondary | ICD-10-CM

## 2022-05-04 DIAGNOSIS — R2681 Unsteadiness on feet: Secondary | ICD-10-CM

## 2022-05-04 NOTE — Therapy (Signed)
OUTPATIENT PHYSICAL THERAPY NEURO EVALUATION   Patient Name: Samuel Stanley MRN: [id number] DOB:[birth year], 32 y.o., male Today's Date: 05/04/2022   PCP: Dr. Gar Ponto, MD REFERRING PROVIDER: Luella Cook, MD  END OF SESSION:  PT End of Session - 05/04/22 1126     Visit Number 1    Number of Visits 8    Date for PT Re-Evaluation 06/01/22    Authorization Type [employer]    PT Start Time 1120    PT Stop Time 1200    PT Time Calculation (min) 40 min    Equipment Utilized During Treatment Gait belt    Activity Tolerance Patient tolerated treatment well    Behavior During Therapy [facility] for tasks assessed/performed             Past Medical History:  Diagnosis Date   Hypertension    Past Surgical History:  Procedure Laterality Date   HERNIA REPAIR     testicular torsion     Patient Active Problem List   Diagnosis Date Noted   Anterior dislocation of right shoulder 03/01/2016    ONSET DATE: about 5 years   REFERRING DIAG: G11.9 (ICD-10-CM) - Hereditary ataxia, unspecified  THERAPY DIAG:  Unsteadiness on feet  Ataxia  Other lack of coordination  Other abnormalities of gait and mobility  Difficulty in walking, not elsewhere classified  Rationale for Evaluation and Treatment: Rehabilitation  SUBJECTIVE:                                                                                                                                                                                             SUBJECTIVE STATEMENT: Issues with balance and leg weakness; cerebellar ataxia.  Has a 47 yo son ; married; symptoms started 5 years ago; gradually worsening; wife is a Marine scientist; has had some PT in [place] but is too far too go.  Grandmother drove him here today. Pt accompanied by: self  PERTINENT HISTORY:   PAIN:  Are you having pain? No  PRECAUTIONS: Fall  WEIGHT BEARING RESTRICTIONS: No  FALLS: Has patient fallen in last 6 months? No denies falls but  wife states he has had several near misses  LIVING ENVIRONMENT: Lives with: lives with their family and lives with their spouse Lives in: House/apartment Stairs: Yes: External: 2 steps; none Has following equipment at home: Environmental consultant - 4 wheeled  PLOF: Independent  PATIENT GOALS: get my legs stronger  OBJECTIVE:   DIAGNOSTIC FINDINGS: no recent scans  COGNITION: Overall cognitive status: Within functional limits for tasks assessed however speech is slow; distorted   SENSATION: WFL  COORDINATION: Impaired  EDEMA:  None noted  MUSCLE TONE:    POSTURE: rounded shoulders and forward head  LOWER EXTREMITY ROM:     Active  Right Eval Left Eval  Hip flexion    Hip extension    Hip abduction    Hip adduction    Hip internal rotation    Hip external rotation    Knee flexion    Knee extension    Ankle dorsiflexion    Ankle plantarflexion    Ankle inversion    Ankle eversion     (Blank rows = not tested)  LOWER EXTREMITY MMT:    MMT Right Eval Left Eval  Hip flexion 4+ 5  Hip extension    Hip abduction    Hip adduction    Hip internal rotation    Hip external rotation    Knee flexion    Knee extension 4+ 4+  Ankle dorsiflexion 5 5  Ankle plantarflexion    Ankle inversion    Ankle eversion    (Blank rows = not tested)  TRANSFERS: Assistive device utilized: None  Sit to stand: SBA Stand to sit: SBA Chair to chair: SBA  STAIRS: Level of Assistance: CGA Stair Negotiation Technique: Alternating Pattern  with Bilateral Rails Number of Stairs: 6  Height of Stairs: 4"  Comments: difficulty with placement of feet on steps  GAIT: Gait pattern: decreased arm swing- Right, decreased arm swing- Left, decreased step length- Right, decreased step length- Left, shuffling, poor foot clearance- Right, and poor foot clearance- Left Distance walked: 50 ft Assistive device utilized: None Level of assistance: CGA Comments: unable to step over obstacle in  pathway  FUNCTIONAL TESTS:  5 times sit to stand: 50.27 sec  PATIENT SURVEYS:  FOTO 54  DGI 1. Gait level surface (1) Moderate Impairment: Walks 20', slow speed, abnormal gait pattern, evidence for imbalance. 2. Change in gait speed (1) Moderate Impairment: Makes only minor adjustments to walking speed, or accomplishes a change in speed with significant gait deviations, or changes speed but has significant gait deviations, or changes speed but loses balance but is able to recover and continue walking. 3. Gait with horizontal head turns (1) Moderate Impairment: Performs head turns with moderate change in gait velocity, slows down, staggers but recovers, can continue to walk. 4. Gait with vertical head turns 1) Moderate Impairment: Performs head turns with moderate change in gait velocity, slows down, staggers but recovers, can continue to walk. 5. Gait and pivot turn (1) Moderate Impairment: Turns slowly, requires verbal cueing, requires several small steps to catch balance following turn and stop. 6. Step over obstacle (0) Severe Impairment: Cannot perform without assistance. 7. Step around obstacles (1) Moderate Impairment: Is able to clear cones but must significantly slow, speed to accomplish task, or requires verbal cueing. 8. Stairs (1) Moderate Impairment: Two feet to a stair, must use rail.  TOTAL SCORE: 7 / 24   TODAY'S TREATMENT:  DATE: 05/04/2022 physical therapy evaluation and HEP instruction   PATIENT EDUCATION: Education details: Patient educated on exam findings, POC, scope of PT, HEP, and what to expect next treatment. Person educated: Patient Education method: Explanation, Demonstration, and Handouts Education comprehension: verbalized understanding, returned demonstration, verbal cues required, and tactile cues required  HOME EXERCISE  PROGRAM: Access Code: [email or web address] URL: [email or web address] Date: 05/04/2022 Prepared by: AP - Rehab  Exercises - Heel Toe Raises with Counter Support  - 2 x daily - 7 x weekly - 1 sets - 10 reps - Mini Squat with Counter Support  - 2 x daily - 7 x weekly - 1 sets - 10 reps  GOALS: Goals reviewed with patient? No  SHORT TERM GOALS: Target date: 05/18/2022  patient will be independent with initial HEP   Baseline: Goal status: INITIAL  2.  Patient will self report 30% improvement to improve tolerance for functional activity Baseline:  Goal status: INITIAL   LONG TERM GOALS: Target date: 06/01/2022  Patient will be independent in self management strategies to improve quality of life and functional outcomes.   Baseline:  Goal status: INITIAL  2.  Patient will self report 50% improvement to improve tolerance for functional activity Baseline:  Goal status: INITIAL  3.  Patient will improve FOTO score to predicted value    Baseline: 54 Goal status: INITIAL  4.  Patient will improve 5 times sit to stand score from 50.27 sec to 30 sec to demonstrate improved functional mobility and increased lower extremity strength.  Baseline: 50.27 sec Goal status: INITIAL  5.  Patient will improve DGI by 8 points (15) to demonstrate improved functional gait and balance with community ambulation Baseline: 7/24 Goal status: INITIAL   ASSESSMENT:  CLINICAL IMPRESSION: Patient is a 32 y.o. male who was seen today for physical therapy evaluation and treatment for cerebellar ataxia. Patient presents to physical therapy with complaint of leg weakness and imbalance. Patient demonstrates decreased strength, balance deficits and gait abnormalities which are negatively impacting patient ability to perform ADLs and functional mobility tasks. Patient will benefit from skilled physical therapy services to address these deficits to improve level of function with ADLs, functional  mobility tasks, and reduce risk for falls.    OBJECTIVE IMPAIRMENTS: Abnormal gait, decreased activity tolerance, decreased balance, decreased coordination, decreased endurance, decreased knowledge of use of DME, decreased mobility, difficulty walking, decreased ROM, decreased strength, decreased safety awareness, hypomobility, increased fascial restrictions, impaired perceived functional ability, and impaired flexibility.   ACTIVITY LIMITATIONS: carrying, lifting, bending, sitting, standing, squatting, stairs, transfers, bed mobility, bathing, toileting, dressing, hygiene/grooming, locomotion level, and caring for others  PARTICIPATION LIMITATIONS: meal prep, cleaning, laundry, driving, shopping, community activity, occupation, and yard work  [employer] POTENTIAL: Good  CLINICAL DECISION MAKING: Evolving/moderate complexity  EVALUATION COMPLEXITY: Low  PLAN:  PT FREQUENCY: 2x/week  PT DURATION: 4 weeks  PLANNED INTERVENTIONS: Therapeutic exercises, Therapeutic activity, Neuromuscular re-education, Balance training, Gait training, Patient/Family education, Joint manipulation, Joint mobilization, Stair training, Orthotic/Fit training, DME instructions, Aquatic Therapy, Dry Needling, Electrical stimulation, Spinal manipulation, Spinal mobilization, Cryotherapy, Moist heat, Compression bandaging, scar mobilization, Splintting, Taping, Traction, Ultrasound, Ionotophoresis 47m/ml Dexamethasone, and Manual therapy   PLAN FOR NEXT SESSION: Review HEP and goals; gait, balance; coordination and lower extremity strength   1:05 PM, 05/04/22 Meghana Tullo Small Keiera Strathman MPT [facility]  #[phone]:[phone]

## 2022-05-09 ENCOUNTER — Ambulatory Visit (HOSPITAL_COMMUNITY): Payer: Commercial Managed Care - PPO

## 2022-05-09 DIAGNOSIS — R262 Difficulty in walking, not elsewhere classified: Secondary | ICD-10-CM | POA: Diagnosis not present

## 2022-05-09 DIAGNOSIS — R2689 Other abnormalities of gait and mobility: Secondary | ICD-10-CM

## 2022-05-09 DIAGNOSIS — R270 Ataxia, unspecified: Secondary | ICD-10-CM | POA: Diagnosis not present

## 2022-05-09 DIAGNOSIS — R278 Other lack of coordination: Secondary | ICD-10-CM

## 2022-05-09 DIAGNOSIS — R2681 Unsteadiness on feet: Secondary | ICD-10-CM | POA: Diagnosis not present

## 2022-05-09 NOTE — Therapy (Signed)
OUTPATIENT PHYSICAL THERAPY NEURO EVALUATION   Patient Name: Samuel Stanley MRN: [id number] DOB:11-Dec-1990, 32 y.o., male Today's Date: 05/09/2022   PCP: Dr. Gar Ponto, MD REFERRING PROVIDER: Luella Cook, MD  END OF SESSION:  PT End of Session - 05/09/22 0908     Visit Number 2    Number of Visits 8    Date for PT Re-Evaluation 06/01/22    Authorization Type [employer]    PT Start Time [phone]    PT Stop Time 0945    PT Time Calculation (min) 40 min    Equipment Utilized During Treatment Gait belt    Activity Tolerance Patient tolerated treatment well    Behavior During Therapy [facility] for tasks assessed/performed             Past Medical History:  Diagnosis Date   Hypertension    Past Surgical History:  Procedure Laterality Date   HERNIA REPAIR     testicular torsion     Patient Active Problem List   Diagnosis Date Noted   Anterior dislocation of right shoulder 03/01/2016    ONSET DATE: about 5 years   REFERRING DIAG: G11.9 (ICD-10-CM) - Hereditary ataxia, unspecified  THERAPY DIAG:  Unsteadiness on feet  Ataxia  Other lack of coordination  Difficulty in walking, not elsewhere classified  Other abnormalities of gait and mobility  Rationale for Evaluation and Treatment: Rehabilitation  SUBJECTIVE:                                                                                                                                                                                             SUBJECTIVE STATEMENT: Patient arrives with rollator today.  Patient states compliance with HEP   Eval:Issues with balance and leg weakness; cerebellar ataxia.  Has a 33 yo son ; married; symptoms started 5 years ago; gradually worsening; wife is a Marine scientist; has had some PT in [place] but is too far too go.  Grandmother drove him here today. Pt accompanied by: self  PERTINENT HISTORY:   PAIN:  Are you having pain? No  PRECAUTIONS: Fall  WEIGHT BEARING  RESTRICTIONS: No  FALLS: Has patient fallen in last 6 months? No denies falls but wife states he has had several near misses  LIVING ENVIRONMENT: Lives with: lives with their family and lives with their spouse Lives in: House/apartment Stairs: Yes: External: 2 steps; none Has following equipment at home: Environmental consultant - 4 wheeled  PLOF: Independent  PATIENT GOALS: get my legs stronger  OBJECTIVE:   DIAGNOSTIC FINDINGS: no recent scans  COGNITION: Overall cognitive status: Within functional limits for tasks assessed  however speech is slow; distorted   SENSATION: WFL  COORDINATION: Impaired   EDEMA:  None noted  MUSCLE TONE:    POSTURE: rounded shoulders and forward head  LOWER EXTREMITY ROM:     Active  Right Eval Left Eval  Hip flexion    Hip extension    Hip abduction    Hip adduction    Hip internal rotation    Hip external rotation    Knee flexion    Knee extension    Ankle dorsiflexion    Ankle plantarflexion    Ankle inversion    Ankle eversion     (Blank rows = not tested)  LOWER EXTREMITY MMT:    MMT Right Eval Left Eval  Hip flexion 4+ 5  Hip extension    Hip abduction    Hip adduction    Hip internal rotation    Hip external rotation    Knee flexion    Knee extension 4+ 4+  Ankle dorsiflexion 5 5  Ankle plantarflexion    Ankle inversion    Ankle eversion    (Blank rows = not tested)  TRANSFERS: Assistive device utilized: None  Sit to stand: SBA Stand to sit: SBA Chair to chair: SBA  STAIRS: Level of Assistance: CGA Stair Negotiation Technique: Alternating Pattern  with Bilateral Rails Number of Stairs: 6  Height of Stairs: 4"  Comments: difficulty with placement of feet on steps  GAIT: Gait pattern: decreased arm swing- Right, decreased arm swing- Left, decreased step length- Right, decreased step length- Left, shuffling, poor foot clearance- Right, and poor foot clearance- Left Distance walked: 50 ft Assistive device  utilized: None Level of assistance: CGA Comments: unable to step over obstacle in pathway  FUNCTIONAL TESTS:  5 times sit to stand: 50.27 sec  PATIENT SURVEYS:  FOTO 54  DGI 1. Gait level surface (1) Moderate Impairment: Walks 20', slow speed, abnormal gait pattern, evidence for imbalance. 2. Change in gait speed (1) Moderate Impairment: Makes only minor adjustments to walking speed, or accomplishes a change in speed with significant gait deviations, or changes speed but has significant gait deviations, or changes speed but loses balance but is able to recover and continue walking. 3. Gait with horizontal head turns (1) Moderate Impairment: Performs head turns with moderate change in gait velocity, slows down, staggers but recovers, can continue to walk. 4. Gait with vertical head turns 1) Moderate Impairment: Performs head turns with moderate change in gait velocity, slows down, staggers but recovers, can continue to walk. 5. Gait and pivot turn (1) Moderate Impairment: Turns slowly, requires verbal cueing, requires several small steps to catch balance following turn and stop. 6. Step over obstacle (0) Severe Impairment: Cannot perform without assistance. 7. Step around obstacles (1) Moderate Impairment: Is able to clear cones but must significantly slow, speed to accomplish task, or requires verbal cueing. 8. Stairs (1) Moderate Impairment: Two feet to a stair, must use rail.  TOTAL SCORE: 7 / 24   TODAY'S TREATMENT:  DATE:  05/09/22 Review of HEP and goals Standing: //bars Heel toe raises with 1 UE assist 2 x 10 Mini squats x 10 with 1 UE assist Long steps in // bars x 4 reps with 1 UE assist Sidestepping in // bars x 6 reps with 1 UE assist 1 UE assist marching x 10 2 UE hip abduction x 10 4" step ups x 10 each Toe tap on cones x 2; 5 times  each          05/04/2022 physical therapy evaluation and HEP instruction   PATIENT EDUCATION: Education details: Patient educated on exam findings, POC, scope of PT, HEP, and what to expect next treatment. Person educated: Patient Education method: Explanation, Demonstration, and Handouts Education comprehension: verbalized understanding, returned demonstration, verbal cues required, and tactile cues required  HOME EXERCISE PROGRAM: 05/09/22 hip abduction, marching in standing  Access Code: [email or web address] URL: [email or web address] Date: 05/04/2022 Prepared by: AP - Rehab  Exercises - Heel Toe Raises with Counter Support  - 2 x daily - 7 x weekly - 1 sets - 10 reps - Mini Squat with Counter Support  - 2 x daily - 7 x weekly - 1 sets - 10 reps  GOALS: Goals reviewed with patient? No  SHORT TERM GOALS: Target date: 05/18/2022  patient will be independent with initial HEP   Baseline: Goal status: IN PROGRESS  2.  Patient will self report 30% improvement to improve tolerance for functional activity Baseline:  Goal status: IN PROGRESS   LONG TERM GOALS: Target date: 06/01/2022  Patient will be independent in self management strategies to improve quality of life and functional outcomes.   Baseline:  Goal status: IN PROGRESS  2.  Patient will self report 50% improvement to improve tolerance for functional activity Baseline:  Goal status: IN PROGRESS  3.  Patient will improve FOTO score to predicted value    Baseline: 54 Goal status: IN PROGRESS  4.  Patient will improve 5 times sit to stand score from 50.27 sec to 30 sec to demonstrate improved functional mobility and increased lower extremity strength.  Baseline: 50.27 sec Goal status: IN PROGRESS  5.  Patient will improve DGI by 8 points (15) to demonstrate improved functional gait and balance with community ambulation Baseline: 7/24 Goal status: IN PROGRESS   ASSESSMENT:  CLINICAL  IMPRESSION: Today's session started with a review of HEP and goals.  Patient verbalizes understanding of set rehab goals.  Patient arrives with rollator today. Needs max cues for longer steps as he shuffles; needs cues for upright posturing and staying close to the walker frame for safety.  Good challenge with step ups; needs cues for sequencing.  Updated HEP today.  Patient has no compliant of pain during treatment.  Sometimes he states " I'm fine" with transfers but needs stand by assist for safety.  Patient will benefit from continued skilled therapy servicesto address deficits and promote return to optimal function.      Eval:Patient is a 32 y.o. male who was seen today for physical therapy evaluation and treatment for cerebellar ataxia. Patient presents to physical therapy with complaint of leg weakness and imbalance. Patient demonstrates decreased strength, balance deficits and gait abnormalities which are negatively impacting patient ability to perform ADLs and functional mobility tasks. Patient will benefit from skilled physical therapy services to address these deficits to improve level of function with ADLs, functional mobility tasks, and reduce risk for falls.    OBJECTIVE IMPAIRMENTS: Abnormal gait, decreased activity tolerance, decreased  balance, decreased coordination, decreased endurance, decreased knowledge of use of DME, decreased mobility, difficulty walking, decreased ROM, decreased strength, decreased safety awareness, hypomobility, increased fascial restrictions, impaired perceived functional ability, and impaired flexibility.   ACTIVITY LIMITATIONS: carrying, lifting, bending, sitting, standing, squatting, stairs, transfers, bed mobility, bathing, toileting, dressing, hygiene/grooming, locomotion level, and caring for others  PARTICIPATION LIMITATIONS: meal prep, cleaning, laundry, driving, shopping, community activity, occupation, and yard work  [employer] POTENTIAL: Good  CLINICAL  DECISION MAKING: Evolving/moderate complexity  EVALUATION COMPLEXITY: Low  PLAN:  PT FREQUENCY: 2x/week  PT DURATION: 4 weeks  PLANNED INTERVENTIONS: Therapeutic exercises, Therapeutic activity, Neuromuscular re-education, Balance training, Gait training, Patient/Family education, Joint manipulation, Joint mobilization, Stair training, Orthotic/Fit training, DME instructions, Aquatic Therapy, Dry Needling, Electrical stimulation, Spinal manipulation, Spinal mobilization, Cryotherapy, Moist heat, Compression bandaging, scar mobilization, Splintting, Taping, Traction, Ultrasound, Ionotophoresis '4mg'$ /ml Dexamethasone, and Manual therapy   PLAN FOR NEXT SESSION:  gait, balance; coordination and lower extremity strength   9:49 AM, 05/09/22 Britanee Vanblarcom Small Corynne Scibilia MPT [facility] [place] [phone] Ph:[phone]

## 2022-05-13 ENCOUNTER — Other Ambulatory Visit (HOSPITAL_COMMUNITY): Payer: Self-pay

## 2022-05-13 MED ORDER — DULOXETINE HCL 60 MG PO CPEP
60.0000 mg | ORAL_CAPSULE | Freq: Every day | ORAL | 1 refills | Status: DC
Start: 2022-05-13 — End: 2022-06-26
  Filled 2022-05-13: qty 90, 90d supply, fill #0

## 2022-05-15 ENCOUNTER — Other Ambulatory Visit (HOSPITAL_COMMUNITY): Payer: Self-pay

## 2022-05-19 ENCOUNTER — Ambulatory Visit (HOSPITAL_COMMUNITY): Payer: Commercial Managed Care - PPO

## 2022-05-22 DIAGNOSIS — E538 Deficiency of other specified B group vitamins: Secondary | ICD-10-CM | POA: Diagnosis not present

## 2022-05-22 DIAGNOSIS — R030 Elevated blood-pressure reading, without diagnosis of hypertension: Secondary | ICD-10-CM | POA: Diagnosis not present

## 2022-05-22 DIAGNOSIS — E7849 Other hyperlipidemia: Secondary | ICD-10-CM | POA: Diagnosis not present

## 2022-05-22 DIAGNOSIS — Z6832 Body mass index (BMI) 32.0-32.9, adult: Secondary | ICD-10-CM | POA: Diagnosis not present

## 2022-05-22 DIAGNOSIS — G319 Degenerative disease of nervous system, unspecified: Secondary | ICD-10-CM | POA: Diagnosis not present

## 2022-05-22 DIAGNOSIS — F331 Major depressive disorder, recurrent, moderate: Secondary | ICD-10-CM | POA: Diagnosis not present

## 2022-05-23 ENCOUNTER — Ambulatory Visit (HOSPITAL_COMMUNITY): Payer: Commercial Managed Care - PPO | Attending: Family Medicine

## 2022-05-23 DIAGNOSIS — R262 Difficulty in walking, not elsewhere classified: Secondary | ICD-10-CM

## 2022-05-23 DIAGNOSIS — R270 Ataxia, unspecified: Secondary | ICD-10-CM | POA: Diagnosis not present

## 2022-05-23 DIAGNOSIS — R2689 Other abnormalities of gait and mobility: Secondary | ICD-10-CM | POA: Insufficient documentation

## 2022-05-23 DIAGNOSIS — M6281 Muscle weakness (generalized): Secondary | ICD-10-CM | POA: Insufficient documentation

## 2022-05-23 DIAGNOSIS — R2681 Unsteadiness on feet: Secondary | ICD-10-CM

## 2022-05-23 DIAGNOSIS — R278 Other lack of coordination: Secondary | ICD-10-CM | POA: Diagnosis not present

## 2022-05-23 NOTE — Therapy (Signed)
OUTPATIENT PHYSICAL THERAPY NEURO EVALUATION   Patient Name: Samuel Stanley MRN: [id number] DOB:[birth year], 32 y.o., male Today's Date: 05/23/2022   PCP: Dr. Gar Ponto, MD REFERRING PROVIDER: Luella Cook, MD  END OF SESSION:  PT End of Session - 05/23/22 0823     Visit Number 3    Number of Visits 8    Date for PT Re-Evaluation 06/01/22    Authorization Type [employer]    PT Start Time [phone]    PT Stop Time 0905    PT Time Calculation (min) 42 min    Equipment Utilized During Treatment Gait belt    Activity Tolerance Patient tolerated treatment well    Behavior During Therapy [facility] for tasks assessed/performed             Past Medical History:  Diagnosis Date   Hypertension    Past Surgical History:  Procedure Laterality Date   HERNIA REPAIR     testicular torsion     Patient Active Problem List   Diagnosis Date Noted   Anterior dislocation of right shoulder 03/01/2016    ONSET DATE: about 5 years   REFERRING DIAG: G11.9 (ICD-10-CM) - Hereditary ataxia, unspecified  THERAPY DIAG:  Unsteadiness on feet  Ataxia  Other lack of coordination  Difficulty in walking, not elsewhere classified  Rationale for Evaluation and Treatment: Rehabilitation  SUBJECTIVE:                                                                                                                                                                                             SUBJECTIVE STATEMENT: Patient arrives with rollator today.  Reports medication added but unsure of the name.  Reports no pain and no new falls   Eval:Issues with balance and leg weakness; cerebellar ataxia.  Has a 16 yo son ; married; symptoms started 5 years ago; gradually worsening; wife is a [employer] scientist; has had some PT in [place] but is too far too go.  Grandmother drove him here today. Pt accompanied by: self  PERTINENT HISTORY:   PAIN:  Are you having pain? No  PRECAUTIONS: Fall  WEIGHT  BEARING RESTRICTIONS: No  FALLS: Has patient fallen in last 6 months? No denies falls but wife states he has had several near misses  LIVING ENVIRONMENT: Lives with: lives with their family and lives with their spouse Lives in: House/apartment Stairs: Yes: External: 2 steps; none Has following equipment at home: [place] - 4 wheeled  PLOF: Independent  PATIENT GOALS: get my legs stronger  OBJECTIVE:   DIAGNOSTIC FINDINGS: no recent scans  COGNITION: Overall cognitive status: Within functional  limits for tasks assessed however speech is slow; distorted   SENSATION: WFL  COORDINATION: Impaired   EDEMA:  None noted  MUSCLE TONE:    POSTURE: rounded shoulders and forward head  LOWER EXTREMITY ROM:     Active  Right Eval Left Eval  Hip flexion    Hip extension    Hip abduction    Hip adduction    Hip internal rotation    Hip external rotation    Knee flexion    Knee extension    Ankle dorsiflexion    Ankle plantarflexion    Ankle inversion    Ankle eversion     (Blank rows = not tested)  LOWER EXTREMITY MMT:    MMT Right Eval Left Eval  Hip flexion 4+ 5  Hip extension    Hip abduction    Hip adduction    Hip internal rotation    Hip external rotation    Knee flexion    Knee extension 4+ 4+  Ankle dorsiflexion 5 5  Ankle plantarflexion    Ankle inversion    Ankle eversion    (Blank rows = not tested)  TRANSFERS: Assistive device utilized: None  Sit to stand: SBA Stand to sit: SBA Chair to chair: SBA  STAIRS: Level of Assistance: CGA Stair Negotiation Technique: Alternating Pattern  with Bilateral Rails Number of Stairs: 6  Height of Stairs: 4"  Comments: difficulty with placement of feet on steps  GAIT: Gait pattern: decreased arm swing- Right, decreased arm swing- Left, decreased step length- Right, decreased step length- Left, shuffling, poor foot clearance- Right, and poor foot clearance- Left Distance walked: 50 ft Assistive device  utilized: None Level of assistance: CGA Comments: unable to step over obstacle in pathway  FUNCTIONAL TESTS:  5 times sit to stand: 50.27 sec  PATIENT SURVEYS:  FOTO 54  DGI 1. Gait level surface (1) Moderate Impairment: Walks 20', slow speed, abnormal gait pattern, evidence for imbalance. 2. Change in gait speed (1) Moderate Impairment: Makes only minor adjustments to walking speed, or accomplishes a change in speed with significant gait deviations, or changes speed but has significant gait deviations, or changes speed but loses balance but is able to recover and continue walking. 3. Gait with horizontal head turns (1) Moderate Impairment: Performs head turns with moderate change in gait velocity, slows down, staggers but recovers, can continue to walk. 4. Gait with vertical head turns 1) Moderate Impairment: Performs head turns with moderate change in gait velocity, slows down, staggers but recovers, can continue to walk. 5. Gait and pivot turn (1) Moderate Impairment: Turns slowly, requires verbal cueing, requires several small steps to catch balance following turn and stop. 6. Step over obstacle (0) Severe Impairment: Cannot perform without assistance. 7. Step around obstacles (1) Moderate Impairment: Is able to clear cones but must significantly slow, speed to accomplish task, or requires verbal cueing. 8. Stairs (1) Moderate Impairment: Two feet to a stair, must use rail.  TOTAL SCORE: 7 / 24   TODAY'S TREATMENT:  DATE:  05/23/22 Step navigation with 2 UE assist, CGA; occassional 1 UE assist x 2  Standing: Heel/ toe raises x 20 with 1 UE assist 2# Marching 2" hold x 10 with 2 UE assist Figure 8 walking x 4 with CGA 2# hip abduction 2# hip extension 2# side stepping in // bars down and back x 3 2" step up and over with 2# weights on legs and 1 UE  assist x 5 Toe taps with 2# weights on legs; 2 cones x 5 each 2 UE assist    05/09/22 Review of HEP and goals Standing: //bars Heel toe raises with 1 UE assist 2 x 10 Mini squats x 10 with 1 UE assist Long steps in // bars x 4 reps with 1 UE assist Sidestepping in // bars x 6 reps with 1 UE assist 1 UE assist marching x 10 2 UE hip abduction x 10 4" step ups x 10 each Toe tap on cones x 2; 5 times each    05/04/2022 physical therapy evaluation and HEP instruction   PATIENT EDUCATION: Education details: Patient educated on exam findings, POC, scope of PT, HEP, and what to expect next treatment. Person educated: Patient Education method: Explanation, Demonstration, and Handouts Education comprehension: verbalized understanding, returned demonstration, verbal cues required, and tactile cues required  HOME EXERCISE PROGRAM: 05/09/22 hip abduction, marching in standing  Access Code: [email or web address] URL: [email or web address] Date: 05/04/2022 Prepared by: AP - Rehab  Exercises - Heel Toe Raises with Counter Support  - 2 x daily - 7 x weekly - 1 sets - 10 reps - Mini Squat with Counter Support  - 2 x daily - 7 x weekly - 1 sets - 10 reps  GOALS: Goals reviewed with patient? No  SHORT TERM GOALS: Target date: 05/18/2022  patient will be independent with initial HEP   Baseline: Goal status: IN PROGRESS  2.  Patient will self report 30% improvement to improve tolerance for functional activity Baseline:  Goal status: IN PROGRESS   LONG TERM GOALS: Target date: 06/01/2022  Patient will be independent in self management strategies to improve quality of life and functional outcomes.   Baseline:  Goal status: IN PROGRESS  2.  Patient will self report 50% improvement to improve tolerance for functional activity Baseline:  Goal status: IN PROGRESS  3.  Patient will improve FOTO score to predicted value    Baseline: 54 Goal status: IN PROGRESS  4.  Patient  will improve 5 times sit to stand score from 50.27 sec to 30 sec to demonstrate improved functional mobility and increased lower extremity strength.  Baseline: 50.27 sec Goal status: IN PROGRESS  5.  Patient will improve DGI by 8 points (15) to demonstrate improved functional gait and balance with community ambulation Baseline: 7/24 Goal status: IN PROGRESS   ASSESSMENT:  CLINICAL IMPRESSION: Today's session focused on patient balance and lower extremity strength.  Patient with trouble lifting foot to take a step; gauging distance.  Added weights to legs to strengthen and practice hip flexion; strengthen hips as patient with shuffling gait pattern that tends to speed up and he has a hard time with walking with good balance; difficulty changing directions and navigating surface height changes so practiced figure eight walking and stepping up and over a step. Patient remains at risk for falls secondary to above.  Patient will benefit from continued skilled therapy servicesto address deficits and promote return to optimal function.      Eval:Patient is a 32  y.o. male who was seen today for physical therapy evaluation and treatment for cerebellar ataxia. Patient presents to physical therapy with complaint of leg weakness and imbalance. Patient demonstrates decreased strength, balance deficits and gait abnormalities which are negatively impacting patient ability to perform ADLs and functional mobility tasks. Patient will benefit from skilled physical therapy services to address these deficits to improve level of function with ADLs, functional mobility tasks, and reduce risk for falls.    OBJECTIVE IMPAIRMENTS: Abnormal gait, decreased activity tolerance, decreased balance, decreased coordination, decreased endurance, decreased knowledge of use of DME, decreased mobility, difficulty walking, decreased ROM, decreased strength, decreased safety awareness, hypomobility, increased fascial restrictions,  impaired perceived functional ability, and impaired flexibility.   ACTIVITY LIMITATIONS: carrying, lifting, bending, sitting, standing, squatting, stairs, transfers, bed mobility, bathing, toileting, dressing, hygiene/grooming, locomotion level, and caring for others  PARTICIPATION LIMITATIONS: meal prep, cleaning, laundry, driving, shopping, community activity, occupation, and yard work  [employer] POTENTIAL: Good  CLINICAL DECISION MAKING: Evolving/moderate complexity  EVALUATION COMPLEXITY: Low  PLAN:  PT FREQUENCY: 2x/week  PT DURATION: 4 weeks  PLANNED INTERVENTIONS: Therapeutic exercises, Therapeutic activity, Neuromuscular re-education, Balance training, Gait training, Patient/Family education, Joint manipulation, Joint mobilization, Stair training, Orthotic/Fit training, DME instructions, Aquatic Therapy, Dry Needling, Electrical stimulation, Spinal manipulation, Spinal mobilization, Cryotherapy, Moist heat, Compression bandaging, scar mobilization, Splintting, Taping, Traction, Ultrasound, Ionotophoresis '4mg'$ /ml Dexamethasone, and Manual therapy   PLAN FOR NEXT SESSION:  gait, balance; coordination and lower extremity strength   9:12 AM, 05/23/22 Amram Maya Small Laparis Durrett MPT [facility] [place] [phone] [id number]

## 2022-05-25 ENCOUNTER — Ambulatory Visit (HOSPITAL_COMMUNITY): Payer: Commercial Managed Care - PPO

## 2022-05-25 DIAGNOSIS — R262 Difficulty in walking, not elsewhere classified: Secondary | ICD-10-CM

## 2022-05-25 DIAGNOSIS — R270 Ataxia, unspecified: Secondary | ICD-10-CM

## 2022-05-25 DIAGNOSIS — R2689 Other abnormalities of gait and mobility: Secondary | ICD-10-CM | POA: Diagnosis not present

## 2022-05-25 DIAGNOSIS — R2681 Unsteadiness on feet: Secondary | ICD-10-CM | POA: Diagnosis not present

## 2022-05-25 DIAGNOSIS — M6281 Muscle weakness (generalized): Secondary | ICD-10-CM | POA: Diagnosis not present

## 2022-05-25 DIAGNOSIS — R278 Other lack of coordination: Secondary | ICD-10-CM | POA: Diagnosis not present

## 2022-05-25 NOTE — Therapy (Signed)
OUTPATIENT PHYSICAL THERAPY NEURO EVALUATION   Patient Name: Samuel Stanley MRN: [id number] DOB:17-Feb-1991, 32 y.o., male Today's Date: 05/25/2022   PCP: Dr. Gar Ponto, MD REFERRING PROVIDER: Luella Cook, MD  END OF SESSION:  PT End of Session - 05/25/22 0902     Visit Number 4    Number of Visits 8    Date for PT Re-Evaluation 06/01/22    Authorization Type [employer]    PT Start Time 0901    PT Stop Time [phone]    PT Time Calculation (min) 43 min    Equipment Utilized During Treatment Gait belt    Activity Tolerance Patient tolerated treatment well    Behavior During Therapy [facility] for tasks assessed/performed             Past Medical History:  Diagnosis Date   Hypertension    Past Surgical History:  Procedure Laterality Date   HERNIA REPAIR     testicular torsion     Patient Active Problem List   Diagnosis Date Noted   Anterior dislocation of right shoulder 03/01/2016    ONSET DATE: about 5 years   REFERRING DIAG: G11.9 (ICD-10-CM) - Hereditary ataxia, unspecified  THERAPY DIAG:  Unsteadiness on feet  Ataxia  Other lack of coordination  Difficulty in walking, not elsewhere classified  Rationale for Evaluation and Treatment: Rehabilitation  SUBJECTIVE:                                                                                                                                                                                             SUBJECTIVE STATEMENT: Patient arrives with rollator today.  Reports medication added but unsure of the name.  Reports no pain and no new falls   Eval:Issues with balance and leg weakness; cerebellar ataxia.  Has a 72 yo son ; married; symptoms started 5 years ago; gradually worsening; wife is a Marine scientist; has had some PT in [place] but is too far too go.  Grandmother drove him here today. Pt accompanied by: self  PERTINENT HISTORY:   PAIN:  Are you having pain? No  PRECAUTIONS: Fall  WEIGHT  BEARING RESTRICTIONS: No  FALLS: Has patient fallen in last 6 months? No denies falls but wife states he has had several near misses  LIVING ENVIRONMENT: Lives with: lives with their family and lives with their spouse Lives in: House/apartment Stairs: Yes: External: 2 steps; none Has following equipment at home: [place] - 4 wheeled  PLOF: Independent  PATIENT GOALS: get my legs stronger  OBJECTIVE:   DIAGNOSTIC FINDINGS: no recent scans  COGNITION: Overall cognitive status: Within functional  limits for tasks assessed however speech is slow; distorted   SENSATION: WFL  COORDINATION: Impaired   EDEMA:  None noted  MUSCLE TONE:    POSTURE: rounded shoulders and forward head  LOWER EXTREMITY ROM:     Active  Right Eval Left Eval  Hip flexion    Hip extension    Hip abduction    Hip adduction    Hip internal rotation    Hip external rotation    Knee flexion    Knee extension    Ankle dorsiflexion    Ankle plantarflexion    Ankle inversion    Ankle eversion     (Blank rows = not tested)  LOWER EXTREMITY MMT:    MMT Right Eval Left Eval  Hip flexion 4+ 5  Hip extension    Hip abduction    Hip adduction    Hip internal rotation    Hip external rotation    Knee flexion    Knee extension 4+ 4+  Ankle dorsiflexion 5 5  Ankle plantarflexion    Ankle inversion    Ankle eversion    (Blank rows = not tested)  TRANSFERS: Assistive device utilized: None  Sit to stand: SBA Stand to sit: SBA Chair to chair: SBA  STAIRS: Level of Assistance: CGA Stair Negotiation Technique: Alternating Pattern  with Bilateral Rails Number of Stairs: 6  Height of Stairs: 4"  Comments: difficulty with placement of feet on steps  GAIT: Gait pattern: decreased arm swing- Right, decreased arm swing- Left, decreased step length- Right, decreased step length- Left, shuffling, poor foot clearance- Right, and poor foot clearance- Left Distance walked: 50 ft Assistive device  utilized: None Level of assistance: CGA Comments: unable to step over obstacle in pathway  FUNCTIONAL TESTS:  5 times sit to stand: 50.27 sec  PATIENT SURVEYS:  FOTO 54  DGI 1. Gait level surface (1) Moderate Impairment: Walks 20', slow speed, abnormal gait pattern, evidence for imbalance. 2. Change in gait speed (1) Moderate Impairment: Makes only minor adjustments to walking speed, or accomplishes a change in speed with significant gait deviations, or changes speed but has significant gait deviations, or changes speed but loses balance but is able to recover and continue walking. 3. Gait with horizontal head turns (1) Moderate Impairment: Performs head turns with moderate change in gait velocity, slows down, staggers but recovers, can continue to walk. 4. Gait with vertical head turns 1) Moderate Impairment: Performs head turns with moderate change in gait velocity, slows down, staggers but recovers, can continue to walk. 5. Gait and pivot turn (1) Moderate Impairment: Turns slowly, requires verbal cueing, requires several small steps to catch balance following turn and stop. 6. Step over obstacle (0) Severe Impairment: Cannot perform without assistance. 7. Step around obstacles (1) Moderate Impairment: Is able to clear cones but must significantly slow, speed to accomplish task, or requires verbal cueing. 8. Stairs (1) Moderate Impairment: Two feet to a stair, must use rail.  TOTAL SCORE: 7 / 24   TODAY'S TREATMENT:  DATE:  05/25/22  Kept 3# weights on legs throughout to improve proprioception Heel raises x 10 with 1 UE assist Hip abduction with shoulder abduction combined x 10 each with 1 UE assist 3# marching with 2" hold x 10; 2 hand UE assist; trial of 1 UE assist with max difficulty Figure 8 walking with cones x 5 x 2 with CGA 2" step ups x 5 up and  over Toe taps with 3# weights on legs; 3 cones x 5 each 2 UE assist  05/23/22 Step navigation with 2 UE assist, CGA; occassional 1 UE assist x 2  Standing: Heel/ toe raises x 20 with 1 UE assist 2# Marching 2" hold x 10 with 2 UE assist Figure 8 walking x 4 with CGA 2# hip abduction 2# hip extension 2# side stepping in // bars down and back x 3 2" step up and over with 2# weights on legs and 1 UE assist x 5 Toe taps with 2# weights on legs; 2 cones x 5 each 2 UE assist    05/09/22 Review of HEP and goals Standing: //bars Heel toe raises with 1 UE assist 2 x 10 Mini squats x 10 with 1 UE assist Long steps in // bars x 4 reps with 1 UE assist Sidestepping in // bars x 6 reps with 1 UE assist 1 UE assist marching x 10 2 UE hip abduction x 10 4" step ups x 10 each Toe tap on cones x 2; 5 times each    05/04/2022 physical therapy evaluation and HEP instruction   PATIENT EDUCATION: Education details: Patient educated on exam findings, POC, scope of PT, HEP, and what to expect next treatment. Person educated: Patient Education method: Explanation, Demonstration, and Handouts Education comprehension: verbalized understanding, returned demonstration, verbal cues required, and tactile cues required  HOME EXERCISE PROGRAM: 05/09/22 hip abduction, marching in standing  Access Code: [email or web address] URL: [email or web address] Date: 05/04/2022 Prepared by: AP - Rehab  Exercises - Heel Toe Raises with Counter Support  - 2 x daily - 7 x weekly - 1 sets - 10 reps - Mini Squat with Counter Support  - 2 x daily - 7 x weekly - 1 sets - 10 reps  GOALS: Goals reviewed with patient? No  SHORT TERM GOALS: Target date: 05/18/2022  patient will be independent with initial HEP   Baseline: Goal status: IN PROGRESS  2.  Patient will self report 30% improvement to improve tolerance for functional activity Baseline:  Goal status: IN PROGRESS   LONG TERM GOALS: Target date:  06/01/2022  Patient will be independent in self management strategies to improve quality of life and functional outcomes.   Baseline:  Goal status: IN PROGRESS  2.  Patient will self report 50% improvement to improve tolerance for functional activity Baseline:  Goal status: IN PROGRESS  3.  Patient will improve FOTO score to predicted value    Baseline: 54 Goal status: IN PROGRESS  4.  Patient will improve 5 times sit to stand score from 50.27 sec to 30 sec to demonstrate improved functional mobility and increased lower extremity strength.  Baseline: 50.27 sec Goal status: IN PROGRESS  5.  Patient will improve DGI by 8 points (15) to demonstrate improved functional gait and balance with community ambulation Baseline: 7/24 Goal status: IN PROGRESS   ASSESSMENT:  CLINICAL IMPRESSION: Today's session focused on patient balance and lower extremity strength.  Increased weight with marching today; unable to perform marching with 1 hand only; max challenge.  Continues with small shuffling steps with ambulation; only slightly better with rollator. Kept weights on legs throughout treatment today to promote improved proprioception.   Patient remains at risk for falls secondary to above.  Patient will benefit from continued skilled therapy servicesto address deficits and promote return to optimal function.      Eval:Patient is a 32 y.o. male who was seen today for physical therapy evaluation and treatment for cerebellar ataxia. Patient presents to physical therapy with complaint of leg weakness and imbalance. Patient demonstrates decreased strength, balance deficits and gait abnormalities which are negatively impacting patient ability to perform ADLs and functional mobility tasks. Patient will benefit from skilled physical therapy services to address these deficits to improve level of function with ADLs, functional mobility tasks, and reduce risk for falls.    OBJECTIVE IMPAIRMENTS: Abnormal  gait, decreased activity tolerance, decreased balance, decreased coordination, decreased endurance, decreased knowledge of use of DME, decreased mobility, difficulty walking, decreased ROM, decreased strength, decreased safety awareness, hypomobility, increased fascial restrictions, impaired perceived functional ability, and impaired flexibility.   ACTIVITY LIMITATIONS: carrying, lifting, bending, sitting, standing, squatting, stairs, transfers, bed mobility, bathing, toileting, dressing, hygiene/grooming, locomotion level, and caring for others  PARTICIPATION LIMITATIONS: meal prep, cleaning, laundry, driving, shopping, community activity, occupation, and yard work  [employer] POTENTIAL: Good  CLINICAL DECISION MAKING: Evolving/moderate complexity  EVALUATION COMPLEXITY: Low  PLAN:  PT FREQUENCY: 2x/week  PT DURATION: 4 weeks  PLANNED INTERVENTIONS: Therapeutic exercises, Therapeutic activity, Neuromuscular re-education, Balance training, Gait training, Patient/Family education, Joint manipulation, Joint mobilization, Stair training, Orthotic/Fit training, DME instructions, Aquatic Therapy, Dry Needling, Electrical stimulation, Spinal manipulation, Spinal mobilization, Cryotherapy, Moist heat, Compression bandaging, scar mobilization, Splintting, Taping, Traction, Ultrasound, Ionotophoresis '4mg'$ /ml Dexamethasone, and Manual therapy   PLAN FOR NEXT SESSION:  gait, balance; coordination and lower extremity strength   9:02 AM, 05/25/22 Elmyra Banwart Small Dulcey Riederer MPT  physical therapy [place] [phone] Ph:[phone]

## 2022-05-30 ENCOUNTER — Ambulatory Visit (HOSPITAL_COMMUNITY): Payer: Commercial Managed Care - PPO

## 2022-05-30 DIAGNOSIS — R2681 Unsteadiness on feet: Secondary | ICD-10-CM | POA: Diagnosis not present

## 2022-05-30 DIAGNOSIS — R270 Ataxia, unspecified: Secondary | ICD-10-CM

## 2022-05-30 DIAGNOSIS — M6281 Muscle weakness (generalized): Secondary | ICD-10-CM

## 2022-05-30 DIAGNOSIS — R2689 Other abnormalities of gait and mobility: Secondary | ICD-10-CM | POA: Diagnosis not present

## 2022-05-30 DIAGNOSIS — R262 Difficulty in walking, not elsewhere classified: Secondary | ICD-10-CM

## 2022-05-30 DIAGNOSIS — R278 Other lack of coordination: Secondary | ICD-10-CM | POA: Diagnosis not present

## 2022-05-30 NOTE — Therapy (Signed)
OUTPATIENT PHYSICAL THERAPY NEURO EVALUATION   Patient Name: Samuel Stanley MRN: [id number] DOB:13-Jul-1990, 32 y.o., male Today's Date: 05/30/2022   PCP: Dr. Gar Ponto, MD REFERRING PROVIDER: Luella Cook, MD  END OF SESSION:  PT End of Session - 05/30/22 0952     Visit Number 5    Number of Visits 8    Date for PT Re-Evaluation 06/01/22    Authorization Type [employer]    PT Start Time [phone]    PT Stop Time 1030    PT Time Calculation (min) 39 min    Equipment Utilized During Treatment Gait belt    Activity Tolerance Patient tolerated treatment well    Behavior During Therapy [facility] for tasks assessed/performed              Past Medical History:  Diagnosis Date   Hypertension    Past Surgical History:  Procedure Laterality Date   HERNIA REPAIR     testicular torsion     Patient Active Problem List   Diagnosis Date Noted   Anterior dislocation of right shoulder 03/01/2016    ONSET DATE: about 5 years   REFERRING DIAG: G11.9 (ICD-10-CM) - Hereditary ataxia, unspecified  THERAPY DIAG:  Unsteadiness on feet  Ataxia  Other lack of coordination  Difficulty in walking, not elsewhere classified  Other abnormalities of gait and mobility  Muscle weakness (generalized)  Rationale for Evaluation and Treatment: Rehabilitation  SUBJECTIVE:                                                                                                                                                                                             SUBJECTIVE STATEMENT: Patient arrives with rollator today.  States that his rollator was tightened up by his dad. Reports no pain; no new falls   Eval:Issues with balance and leg weakness; cerebellar ataxia.  Has a 81 yo son ; married; symptoms started 5 years ago; gradually worsening; wife is a Marine scientist; has had some PT in [place] but is too far too go.  Grandmother drove him here today. Pt accompanied by: self  PERTINENT  HISTORY:   PAIN:  Are you having pain? No  PRECAUTIONS: Fall  WEIGHT BEARING RESTRICTIONS: No  FALLS: Has patient fallen in last 6 months? No denies falls but wife states he has had several near misses  LIVING ENVIRONMENT: Lives with: lives with their family and lives with their spouse Lives in: House/apartment Stairs: Yes: External: 2 steps; none Has following equipment at home: [place] - 4 wheeled  PLOF: Independent  PATIENT GOALS: get my legs stronger  OBJECTIVE:  DIAGNOSTIC FINDINGS: no recent scans  COGNITION: Overall cognitive status: Within functional limits for tasks assessed however speech is slow; distorted   SENSATION: WFL  COORDINATION: Impaired   EDEMA:  None noted  MUSCLE TONE:    POSTURE: rounded shoulders and forward head  LOWER EXTREMITY ROM:     Active  Right Eval Left Eval  Hip flexion    Hip extension    Hip abduction    Hip adduction    Hip internal rotation    Hip external rotation    Knee flexion    Knee extension    Ankle dorsiflexion    Ankle plantarflexion    Ankle inversion    Ankle eversion     (Blank rows = not tested)  LOWER EXTREMITY MMT:    MMT Right Eval Left Eval  Hip flexion 4+ 5  Hip extension    Hip abduction    Hip adduction    Hip internal rotation    Hip external rotation    Knee flexion    Knee extension 4+ 4+  Ankle dorsiflexion 5 5  Ankle plantarflexion    Ankle inversion    Ankle eversion    (Blank rows = not tested)  TRANSFERS: Assistive device utilized: None  Sit to stand: SBA Stand to sit: SBA Chair to chair: SBA  STAIRS: Level of Assistance: CGA Stair Negotiation Technique: Alternating Pattern  with Bilateral Rails Number of Stairs: 6  Height of Stairs: 4"  Comments: difficulty with placement of feet on steps  GAIT: Gait pattern: decreased arm swing- Right, decreased arm swing- Left, decreased step length- Right, decreased step length- Left, shuffling, poor foot clearance-  Right, and poor foot clearance- Left Distance walked: 50 ft Assistive device utilized: None Level of assistance: CGA Comments: unable to step over obstacle in pathway  FUNCTIONAL TESTS:  5 times sit to stand: 50.27 sec  PATIENT SURVEYS:  FOTO 54  DGI 1. Gait level surface (1) Moderate Impairment: Walks 20', slow speed, abnormal gait pattern, evidence for imbalance. 2. Change in gait speed (1) Moderate Impairment: Makes only minor adjustments to walking speed, or accomplishes a change in speed with significant gait deviations, or changes speed but has significant gait deviations, or changes speed but loses balance but is able to recover and continue walking. 3. Gait with horizontal head turns (1) Moderate Impairment: Performs head turns with moderate change in gait velocity, slows down, staggers but recovers, can continue to walk. 4. Gait with vertical head turns 1) Moderate Impairment: Performs head turns with moderate change in gait velocity, slows down, staggers but recovers, can continue to walk. 5. Gait and pivot turn (1) Moderate Impairment: Turns slowly, requires verbal cueing, requires several small steps to catch balance following turn and stop. 6. Step over obstacle (0) Severe Impairment: Cannot perform without assistance. 7. Step around obstacles (1) Moderate Impairment: Is able to clear cones but must significantly slow, speed to accomplish task, or requires verbal cueing. 8. Stairs (1) Moderate Impairment: Two feet to a stair, must use rail.  TOTAL SCORE: 7 / 24   TODAY'S TREATMENT:  DATE:  05/30/22 3# weights on legs throughout treatment to improve proprioception Sitting: Heel/toe raises LAQ's x 10 each  Standing: Marching x 10 each Heel/toe raises x 10 Hip abduction with shoulder abduction big steps x 10 each holding 1 UE assist 2" step  up and over x 5 using bilateral UE assist Standing no UE support holding yellow med ball trunk rotation x 10 each side SLS 3 x 10" each with 1 UE support Cone taps x 5 each foot with 1 UE support   05/25/22  Kept 3# weights on legs throughout to improve proprioception Heel raises x 10 with 1 UE assist Hip abduction with shoulder abduction combined x 10 each with 1 UE assist 3# marching with 2" hold x 10; 2 hand UE assist; trial of 1 UE assist with max difficulty Figure 8 walking with cones x 5 x 2 with CGA 2" step ups x 5 up and over Toe taps with 3# weights on legs; 3 cones x 5 each 2 UE assist  05/23/22 Step navigation with 2 UE assist, CGA; occassional 1 UE assist x 2  Standing: Heel/ toe raises x 20 with 1 UE assist 2# Marching 2" hold x 10 with 2 UE assist Figure 8 walking x 4 with CGA 2# hip abduction 2# hip extension 2# side stepping in // bars down and back x 3 2" step up and over with 2# weights on legs and 1 UE assist x 5 Toe taps with 2# weights on legs; 2 cones x 5 each 2 UE assist    05/09/22 Review of HEP and goals Standing: //bars Heel toe raises with 1 UE assist 2 x 10 Mini squats x 10 with 1 UE assist Long steps in // bars x 4 reps with 1 UE assist Sidestepping in // bars x 6 reps with 1 UE assist 1 UE assist marching x 10 2 UE hip abduction x 10 4" step ups x 10 each Toe tap on cones x 2; 5 times each    05/04/2022 physical therapy evaluation and HEP instruction   PATIENT EDUCATION: Education details: Patient educated on exam findings, POC, scope of PT, HEP, and what to expect next treatment. Person educated: Patient Education method: Explanation, Demonstration, and Handouts Education comprehension: verbalized understanding, returned demonstration, verbal cues required, and tactile cues required  HOME EXERCISE PROGRAM: 05/09/22 hip abduction, marching in standing  Access Code: [email or web address] URL: [email or web address] Date:  05/04/2022 Prepared by: [facility] - Rehab  Exercises - Heel Toe Raises with Counter Support  - 2 x daily - 7 x weekly - 1 sets - 10 reps - Mini Squat with Counter Support  - 2 x daily - 7 x weekly - 1 sets - 10 reps  GOALS: Goals reviewed with patient? No  SHORT TERM GOALS: Target date: 05/18/2022  patient will be independent with initial HEP   Baseline: Goal status: IN PROGRESS  2.  Patient will self report 30% improvement to improve tolerance for functional activity Baseline:  Goal status: IN PROGRESS   LONG TERM GOALS: Target date: 06/01/2022  Patient will be independent in self management strategies to improve quality of life and functional outcomes.   Baseline:  Goal status: IN PROGRESS  2.  Patient will self report 50% improvement to improve tolerance for functional activity Baseline:  Goal status: IN PROGRESS  3.  Patient will improve FOTO score to predicted value    Baseline: 54 Goal status: IN PROGRESS  4.  Patient will improve 5 times  sit to stand score from 50.27 sec to 30 sec to demonstrate improved functional mobility and increased lower extremity strength.  Baseline: 50.27 sec Goal status: IN PROGRESS  5.  Patient will improve DGI by 8 points (15) to demonstrate improved functional gait and balance with community ambulation Baseline: 7/24 Goal status: IN PROGRESS   ASSESSMENT:  CLINICAL IMPRESSION: Today's session focused on patient balance and lower extremity strength.  Added standing trunk rotation with no UE support with good challenge.  Patient continues with challenge with limb and trunk control.   Max challenge with SLS on left with 1 UE assist.  Tends to want to look down at his feet with single leg balance activity; needs cues to look up.  Patient remains at risk for falls secondary to above.  Patient will benefit from continued skilled therapy servicesto address deficits and promote return to optimal function.      Eval:Patient is a 32 y.o. male  who was seen today for physical therapy evaluation and treatment for cerebellar ataxia. Patient presents to physical therapy with complaint of leg weakness and imbalance. Patient demonstrates decreased strength, balance deficits and gait abnormalities which are negatively impacting patient ability to perform ADLs and functional mobility tasks. Patient will benefit from skilled physical therapy services to address these deficits to improve level of function with ADLs, functional mobility tasks, and reduce risk for falls.    OBJECTIVE IMPAIRMENTS: Abnormal gait, decreased activity tolerance, decreased balance, decreased coordination, decreased endurance, decreased knowledge of use of DME, decreased mobility, difficulty walking, decreased ROM, decreased strength, decreased safety awareness, hypomobility, increased fascial restrictions, impaired perceived functional ability, and impaired flexibility.   ACTIVITY LIMITATIONS: carrying, lifting, bending, sitting, standing, squatting, stairs, transfers, bed mobility, bathing, toileting, dressing, hygiene/grooming, locomotion level, and caring for others  PARTICIPATION LIMITATIONS: meal prep, cleaning, laundry, driving, shopping, community activity, occupation, and yard work  [employer] POTENTIAL: Good  CLINICAL DECISION MAKING: Evolving/moderate complexity  EVALUATION COMPLEXITY: Low  PLAN:  PT FREQUENCY: 2x/week  PT DURATION: 4 weeks  PLANNED INTERVENTIONS: Therapeutic exercises, Therapeutic activity, Neuromuscular re-education, Balance training, Gait training, Patient/Family education, Joint manipulation, Joint mobilization, Stair training, Orthotic/Fit training, DME instructions, Aquatic Therapy, Dry Needling, Electrical stimulation, Spinal manipulation, Spinal mobilization, Cryotherapy, Moist heat, Compression bandaging, scar mobilization, Splintting, Taping, Traction, Ultrasound, Ionotophoresis 4mg /ml Dexamethasone, and Manual therapy   PLAN FOR NEXT  SESSION:  gait, balance; coordination and lower extremity strength   10:32 AM, 05/30/22 Manmeet Arzola Small Zurisadai Helminiak MPT [facility] [place] [phone] Ph:[phone]

## 2022-06-01 ENCOUNTER — Ambulatory Visit (HOSPITAL_COMMUNITY): Payer: Commercial Managed Care - PPO

## 2022-06-01 DIAGNOSIS — M6281 Muscle weakness (generalized): Secondary | ICD-10-CM | POA: Diagnosis not present

## 2022-06-01 DIAGNOSIS — R2689 Other abnormalities of gait and mobility: Secondary | ICD-10-CM

## 2022-06-01 DIAGNOSIS — R270 Ataxia, unspecified: Secondary | ICD-10-CM | POA: Diagnosis not present

## 2022-06-01 DIAGNOSIS — R262 Difficulty in walking, not elsewhere classified: Secondary | ICD-10-CM | POA: Diagnosis not present

## 2022-06-01 DIAGNOSIS — R278 Other lack of coordination: Secondary | ICD-10-CM | POA: Diagnosis not present

## 2022-06-01 DIAGNOSIS — R2681 Unsteadiness on feet: Secondary | ICD-10-CM | POA: Diagnosis not present

## 2022-06-01 NOTE — Therapy (Signed)
OUTPATIENT PHYSICAL THERAPY NEURO EVALUATION   Patient Name: Samuel Stanley MRN: [id number] DOB:1990-07-08, 32 y.o., male Today's Date: 06/01/2022   PCP: Dr. Gar Ponto, MD REFERRING PROVIDER: Luella Cook, MD  END OF SESSION:  PT End of Session - 06/01/22 1001     Visit Number 6    Number of Visits 8    Date for PT Re-Evaluation 06/01/22    Authorization Type [employer]    PT Start Time 1000   late check in   PT Stop Time 1030    PT Time Calculation (min) 30 min    Equipment Utilized During Treatment Gait belt    Activity Tolerance Patient tolerated treatment well    Behavior During Therapy [facility] for tasks assessed/performed               Past Medical History:  Diagnosis Date   Hypertension    Past Surgical History:  Procedure Laterality Date   HERNIA REPAIR     testicular torsion     Patient Active Problem List   Diagnosis Date Noted   Anterior dislocation of right shoulder 03/01/2016    ONSET DATE: about 5 years   REFERRING DIAG: G11.9 (ICD-10-CM) - Hereditary ataxia, unspecified  THERAPY DIAG:  Unsteadiness on feet  Other lack of coordination  Ataxia  Difficulty in walking, not elsewhere classified  Other abnormalities of gait and mobility  Muscle weakness (generalized)  Rationale for Evaluation and Treatment: Rehabilitation  SUBJECTIVE:                                                                                                                                                                                             SUBJECTIVE STATEMENT: Patient arrives with rollator today.  States that his rollator was tightened up by his dad. Reports no pain; no new falls   Eval:Issues with balance and leg weakness; cerebellar ataxia.  Has a 24 yo son ; married; symptoms started 5 years ago; gradually worsening; wife is a Marine scientist; has had some PT in [place] but is too far too go.  Grandmother drove him here today. Pt accompanied by:  self  PERTINENT HISTORY:   PAIN:  Are you having pain? No  PRECAUTIONS: Fall  WEIGHT BEARING RESTRICTIONS: No  FALLS: Has patient fallen in last 6 months? No denies falls but wife states he has had several near misses  LIVING ENVIRONMENT: Lives with: lives with their family and lives with their spouse Lives in: House/apartment Stairs: Yes: External: 2 steps; none Has following equipment at home: Walker - 4 wheeled  PLOF: Independent  PATIENT GOALS: get my legs  stronger  OBJECTIVE:   DIAGNOSTIC FINDINGS: no recent scans  COGNITION: Overall cognitive status: Within functional limits for tasks assessed however speech is slow; distorted   SENSATION: WFL  COORDINATION: Impaired   EDEMA:  None noted  MUSCLE TONE:    POSTURE: rounded shoulders and forward head  LOWER EXTREMITY ROM:     Active  Right Eval Left Eval  Hip flexion    Hip extension    Hip abduction    Hip adduction    Hip internal rotation    Hip external rotation    Knee flexion    Knee extension    Ankle dorsiflexion    Ankle plantarflexion    Ankle inversion    Ankle eversion     (Blank rows = not tested)  LOWER EXTREMITY MMT:    MMT Right Eval Left Eval  Hip flexion 4+ 5  Hip extension    Hip abduction    Hip adduction    Hip internal rotation    Hip external rotation    Knee flexion    Knee extension 4+ 4+  Ankle dorsiflexion 5 5  Ankle plantarflexion    Ankle inversion    Ankle eversion    (Blank rows = not tested)  TRANSFERS: Assistive device utilized: None  Sit to stand: SBA Stand to sit: SBA Chair to chair: SBA  STAIRS: Level of Assistance: CGA Stair Negotiation Technique: Alternating Pattern  with Bilateral Rails Number of Stairs: 6  Height of Stairs: 4"  Comments: difficulty with placement of feet on steps  GAIT: Gait pattern: decreased arm swing- Right, decreased arm swing- Left, decreased step length- Right, decreased step length- Left, shuffling, poor  foot clearance- Right, and poor foot clearance- Left Distance walked: 50 ft Assistive device utilized: None Level of assistance: CGA Comments: unable to step over obstacle in pathway  FUNCTIONAL TESTS:  5 times sit to stand: 50.27 sec  PATIENT SURVEYS:  FOTO 54  DGI 1. Gait level surface (1) Moderate Impairment: Walks 20', slow speed, abnormal gait pattern, evidence for imbalance. 2. Change in gait speed (1) Moderate Impairment: Makes only minor adjustments to walking speed, or accomplishes a change in speed with significant gait deviations, or changes speed but has significant gait deviations, or changes speed but loses balance but is able to recover and continue walking. 3. Gait with horizontal head turns (1) Moderate Impairment: Performs head turns with moderate change in gait velocity, slows down, staggers but recovers, can continue to walk. 4. Gait with vertical head turns 1) Moderate Impairment: Performs head turns with moderate change in gait velocity, slows down, staggers but recovers, can continue to walk. 5. Gait and pivot turn (1) Moderate Impairment: Turns slowly, requires verbal cueing, requires several small steps to catch balance following turn and stop. 6. Step over obstacle (0) Severe Impairment: Cannot perform without assistance. 7. Step around obstacles (1) Moderate Impairment: Is able to clear cones but must significantly slow, speed to accomplish task, or requires verbal cueing. 8. Stairs (1) Moderate Impairment: Two feet to a stair, must use rail.  TOTAL SCORE: 7 / 24   TODAY'S TREATMENT:  DATE:  06/01/22 Progress note FOTO 80 5 x sit to stand  1 min and 16 sec using Ue's to assist 1st trial; 2nd trial 1 min and 2 sec 2nd trial  DGI 1. Gait level surface (2) Mild Impairment: Walks 20', uses assistive devices, slower speed, mild gait  deviations. 2. Change in gait speed (1) Moderate Impairment: Makes only minor adjustments to walking speed, or accomplishes a change in speed with significant gait deviations, or changes speed but has significant gait deviations, or changes speed but loses balance but is able to recover and continue walking. 3. Gait with horizontal head turns (2) Mild Impairment: Performs head turns smoothly with slight change in gait velocity, i.e., minor disruption to smooth gait path or uses walking aid. 4. Gait with vertical head turns (2) Mild Impairment: Performs head turns smoothly with slight change in gait velocity, i.e., minor disruption to smooth gait path or uses walking aid. 5. Gait and pivot turn (2) Mild Impairment: Pivot turns safely in > 3 seconds and stops with no loss of balance. 6. Step over obstacle (1) Moderate Impairment: Is able to step over box but must stop, then step over. May require verbal cueing. 7. Step around obstacles (2) Mild Impairment: Is able to step around both cones, but must slow down and adjust steps to clear cones. 8. Stairs (1) Moderate Impairment: Two feet to a stair, must use rail.  TOTAL SCORE: 13 / 24   05/30/22 3# weights on legs throughout treatment to improve proprioception Sitting: Heel/toe raises LAQ's x 10 each  Standing: Marching x 10 each Heel/toe raises x 10 Hip abduction with shoulder abduction big steps x 10 each holding 1 UE assist 2" step up and over x 5 using bilateral UE assist Standing no UE support holding yellow med ball trunk rotation x 10 each side SLS 3 x 10" each with 1 UE support Cone taps x 5 each foot with 1 UE support   05/25/22  Kept 3# weights on legs throughout to improve proprioception Heel raises x 10 with 1 UE assist Hip abduction with shoulder abduction combined x 10 each with 1 UE assist 3# marching with 2" hold x 10; 2 hand UE assist; trial of 1 UE assist with max difficulty Figure 8 walking with cones x 5 x 2 with  CGA 2" step ups x 5 up and over Toe taps with 3# weights on legs; 3 cones x 5 each 2 UE assist  05/23/22 Step navigation with 2 UE assist, CGA; occassional 1 UE assist x 2  Standing: Heel/ toe raises x 20 with 1 UE assist 2# Marching 2" hold x 10 with 2 UE assist Figure 8 walking x 4 with CGA 2# hip abduction 2# hip extension 2# side stepping in // bars down and back x 3 2" step up and over with 2# weights on legs and 1 UE assist x 5 Toe taps with 2# weights on legs; 2 cones x 5 each 2 UE assist    05/09/22 Review of HEP and goals Standing: //bars Heel toe raises with 1 UE assist 2 x 10 Mini squats x 10 with 1 UE assist Long steps in // bars x 4 reps with 1 UE assist Sidestepping in // bars x 6 reps with 1 UE assist 1 UE assist marching x 10 2 UE hip abduction x 10 4" step ups x 10 each Toe tap on cones x 2; 5 times each    05/04/2022 physical therapy evaluation  and HEP instruction   PATIENT EDUCATION: Education details: Patient educated on exam findings, POC, scope of PT, HEP, and what to expect next treatment. Person educated: Patient Education method: Explanation, Demonstration, and Handouts Education comprehension: verbalized understanding, returned demonstration, verbal cues required, and tactile cues required  HOME EXERCISE PROGRAM: 05/09/22 hip abduction, marching in standing  Access Code: [email or web address] URL: [email or web address] Date: 05/04/2022 Prepared by: [facility] - Rehab  Exercises - Heel Toe Raises with Counter Support  - 2 x daily - 7 x weekly - 1 sets - 10 reps - Mini Squat with Counter Support  - 2 x daily - 7 x weekly - 1 sets - 10 reps  GOALS: Goals reviewed with patient? No  SHORT TERM GOALS: Target date: 05/18/2022  patient will be independent with initial HEP   Baseline: Goal status: IN PROGRESS  2.  Patient will self report 30% improvement to improve tolerance for functional activity Baseline:  Goal status: IN PROGRESS   LONG  TERM GOALS: Target date: 06/01/2022  Patient will be independent in self management strategies to improve quality of life and functional outcomes.   Baseline:  Goal status: IN PROGRESS  2.  Patient will self report 50% improvement to improve tolerance for functional activity Baseline:  Goal status: IN PROGRESS  3.  Patient will improve FOTO score to predicted value  (61)   Baseline: 54; 80 06/01/22 Goal status: MET  4.  Patient will improve 5 times sit to stand score from 50.27 sec to 30 sec to demonstrate improved functional mobility and increased lower extremity strength.  Baseline: 50.27 sec; 3/21 1 min and 2 sec Goal status: IN PROGRESS  5.  Patient will improve DGI by 8 points (15) to demonstrate improved functional gait and balance with community ambulation Baseline: 7/24; 3/21 13 Goal status: IN PROGRESS   ASSESSMENT:  CLINICAL IMPRESSION: Late check in today.  Progress note today.  Met goal for FOTO but feel patient's answers do not match his realistic functional status.  Decreased score on sit to stand test today.  Patient will benefit from continued therapy services to address remaining unmet and partially met goals.   Patient will benefit from continued skilled therapy services to address deficits and promote return to optimal function.      Eval:Patient is a 32 y.o. male who was seen today for physical therapy evaluation and treatment for cerebellar ataxia. Patient presents to physical therapy with complaint of leg weakness and imbalance. Patient demonstrates decreased strength, balance deficits and gait abnormalities which are negatively impacting patient ability to perform ADLs and functional mobility tasks. Patient will benefit from skilled physical therapy services to address these deficits to improve level of function with ADLs, functional mobility tasks, and reduce risk for falls.    OBJECTIVE IMPAIRMENTS: Abnormal gait, decreased activity tolerance, decreased  balance, decreased coordination, decreased endurance, decreased knowledge of use of DME, decreased mobility, difficulty walking, decreased ROM, decreased strength, decreased safety awareness, hypomobility, increased fascial restrictions, impaired perceived functional ability, and impaired flexibility.   ACTIVITY LIMITATIONS: carrying, lifting, bending, sitting, standing, squatting, stairs, transfers, bed mobility, bathing, toileting, dressing, hygiene/grooming, locomotion level, and caring for others  PARTICIPATION LIMITATIONS: meal prep, cleaning, laundry, driving, shopping, community activity, occupation, and yard work  [employer] POTENTIAL: Good  CLINICAL DECISION MAKING: Evolving/moderate complexity  EVALUATION COMPLEXITY: Low  PLAN:  PT FREQUENCY: 2x/week  PT DURATION: 4 weeks  PLANNED INTERVENTIONS: Therapeutic exercises, Therapeutic activity, Neuromuscular re-education, Balance training, Gait training, Patient/Family education, Joint manipulation, Joint mobilization,  Stair training, Orthotic/Fit training, DME instructions, Aquatic Therapy, Dry Needling, Electrical stimulation, Spinal manipulation, Spinal mobilization, Cryotherapy, Moist heat, Compression bandaging, scar mobilization, Splintting, Taping, Traction, Ultrasound, Ionotophoresis 4mg /ml Dexamethasone, and Manual therapy   PLAN FOR NEXT SESSION:  gait, balance; coordination and lower extremity strength; extend 2 week until 06/29/22   10:38 AM, 06/01/22 Guido Comp Small Mats Jeanlouis MPT [facility] [place] [phone] [id number]

## 2022-06-06 ENCOUNTER — Ambulatory Visit (HOSPITAL_COMMUNITY): Payer: Commercial Managed Care - PPO

## 2022-06-06 DIAGNOSIS — R2689 Other abnormalities of gait and mobility: Secondary | ICD-10-CM | POA: Diagnosis not present

## 2022-06-06 DIAGNOSIS — R2681 Unsteadiness on feet: Secondary | ICD-10-CM

## 2022-06-06 DIAGNOSIS — R270 Ataxia, unspecified: Secondary | ICD-10-CM

## 2022-06-06 DIAGNOSIS — M6281 Muscle weakness (generalized): Secondary | ICD-10-CM | POA: Diagnosis not present

## 2022-06-06 DIAGNOSIS — R262 Difficulty in walking, not elsewhere classified: Secondary | ICD-10-CM | POA: Diagnosis not present

## 2022-06-06 DIAGNOSIS — R278 Other lack of coordination: Secondary | ICD-10-CM | POA: Diagnosis not present

## 2022-06-06 NOTE — Therapy (Addendum)
OUTPATIENT PHYSICAL THERAPY NEURO TREATMENT   Patient Name: Samuel Stanley MRN: [id number] DOB:[birth year], 32 y.o., male Today's Date: 06/06/2022   PCP: [name], MD REFERRING PROVIDER: [name], MD  END OF SESSION:  PT End of Session - 06/06/22 0907     Visit Number 7    Number of Visits 12    Date for PT Re-Evaluation 06/29/22    Authorization Type [employer]    PT Start Time 7829    PT Stop Time 0944    PT Time Calculation (min) 39 min    Equipment Utilized During Treatment Gait belt    Activity Tolerance Patient tolerated treatment well    Behavior During Therapy [facility] for tasks assessed/performed               Past Medical History:  Diagnosis Date   Hypertension    Past Surgical History:  Procedure Laterality Date   HERNIA REPAIR     testicular torsion     Patient Active Problem List   Diagnosis Date Noted   Anterior dislocation of right shoulder 03/01/2016    ONSET DATE: about 5 years   REFERRING DIAG: G11.9 (ICD-10-CM) - Hereditary ataxia, unspecified  THERAPY DIAG:  Unsteadiness on feet  Other lack of coordination  Ataxia  Difficulty in walking, not elsewhere classified  Other abnormalities of gait and mobility  Rationale for Evaluation and Treatment: Rehabilitation  SUBJECTIVE:                                                                                                                                                                                             SUBJECTIVE STATEMENT: States that he is practicing on steps using 1 hand to assist with supervision  Eval:Issues with balance and leg weakness; cerebellar ataxia.  Has a 20 yo son ; married; symptoms started 5 years ago; gradually worsening; wife is a Engineer, civil (consulting); has had some PT in [place] but is too far too go.  Grandmother drove him here today. Pt accompanied by: self  PERTINENT HISTORY:   PAIN:  Are you having pain? No  PRECAUTIONS: Fall  WEIGHT  BEARING RESTRICTIONS: No  FALLS: Has patient fallen in last 6 months? No denies falls but wife states he has had several near misses  LIVING ENVIRONMENT: Lives with: lives with their family and lives with their spouse Lives in: House/apartment Stairs: Yes: External: 2 steps; none Has following equipment at home: Walker - 4 wheeled  PLOF: Independent  PATIENT GOALS: get my legs stronger  OBJECTIVE:   DIAGNOSTIC FINDINGS: no recent scans  COGNITION: Overall cognitive status: Within functional  limits for tasks assessed however speech is slow; distorted   SENSATION: WFL  COORDINATION: Impaired   EDEMA:  None noted  MUSCLE TONE:    POSTURE: rounded shoulders and forward head  LOWER EXTREMITY ROM:     Active  Right Eval Left Eval  Hip flexion    Hip extension    Hip abduction    Hip adduction    Hip internal rotation    Hip external rotation    Knee flexion    Knee extension    Ankle dorsiflexion    Ankle plantarflexion    Ankle inversion    Ankle eversion     (Blank rows = not tested)  LOWER EXTREMITY MMT:    MMT Right Eval Left Eval  Hip flexion 4+ 5  Hip extension    Hip abduction    Hip adduction    Hip internal rotation    Hip external rotation    Knee flexion    Knee extension 4+ 4+  Ankle dorsiflexion 5 5  Ankle plantarflexion    Ankle inversion    Ankle eversion    (Blank rows = not tested)  TRANSFERS: Assistive device utilized: None  Sit to stand: SBA Stand to sit: SBA Chair to chair: SBA  STAIRS: Level of Assistance: CGA Stair Negotiation Technique: Alternating Pattern  with Bilateral Rails Number of Stairs: 6  Height of Stairs: 4"  Comments: difficulty with placement of feet on steps  GAIT: Gait pattern: decreased arm swing- Right, decreased arm swing- Left, decreased step length- Right, decreased step length- Left, shuffling, poor foot clearance- Right, and poor foot clearance- Left Distance walked: 50 ft Assistive device  utilized: None Level of assistance: CGA Comments: unable to step over obstacle in pathway  FUNCTIONAL TESTS:  5 times sit to stand: 50.27 sec  PATIENT SURVEYS:  FOTO 54  DGI 1. Gait level surface (1) Moderate Impairment: Walks 20', slow speed, abnormal gait pattern, evidence for imbalance. 2. Change in gait speed (1) Moderate Impairment: Makes only minor adjustments to walking speed, or accomplishes a change in speed with significant gait deviations, or changes speed but has significant gait deviations, or changes speed but loses balance but is able to recover and continue walking. 3. Gait with horizontal head turns (1) Moderate Impairment: Performs head turns with moderate change in gait velocity, slows down, staggers but recovers, can continue to walk. 4. Gait with vertical head turns 1) Moderate Impairment: Performs head turns with moderate change in gait velocity, slows down, staggers but recovers, can continue to walk. 5. Gait and pivot turn (1) Moderate Impairment: Turns slowly, requires verbal cueing, requires several small steps to catch balance following turn and stop. 6. Step over obstacle (0) Severe Impairment: Cannot perform without assistance. 7. Step around obstacles (1) Moderate Impairment: Is able to clear cones but must significantly slow, speed to accomplish task, or requires verbal cueing. 8. Stairs (1) Moderate Impairment: Two feet to a stair, must use rail.  TOTAL SCORE: 7 / 24   TODAY'S TREATMENT:  DATE:  06/06/2022 Step navigation with 1 UE assist on railing 4" and 8" steps  Standing: Heel/toe raises x 20 with 1 UE assist Side step with UE abduction same side x 12 each with 1 UE assist Hip extension 3# x 10 each with 1 UE assist Tandem stance 2 x 30" with CGA for safety; trial of no UE assist Marching 3# x 10 each Squats x  10  Sitting: 3# LAQs x 10 each   06/01/22 Progress note FOTO 80 5 x sit to stand  1 min and 16 sec using Ue's to assist 1st trial; 2nd trial 1 min and 2 sec 2nd trial  DGI 1. Gait level surface (2) Mild Impairment: Walks 20', uses assistive devices, slower speed, mild gait deviations. 2. Change in gait speed (1) Moderate Impairment: Makes only minor adjustments to walking speed, or accomplishes a change in speed with significant gait deviations, or changes speed but has significant gait deviations, or changes speed but loses balance but is able to recover and continue walking. 3. Gait with horizontal head turns (2) Mild Impairment: Performs head turns smoothly with slight change in gait velocity, i.e., minor disruption to smooth gait path or uses walking aid. 4. Gait with vertical head turns (2) Mild Impairment: Performs head turns smoothly with slight change in gait velocity, i.e., minor disruption to smooth gait path or uses walking aid. 5. Gait and pivot turn (2) Mild Impairment: Pivot turns safely in > 3 seconds and stops with no loss of balance. 6. Step over obstacle (1) Moderate Impairment: Is able to step over box but must stop, then step over. May require verbal cueing. 7. Step around obstacles (2) Mild Impairment: Is able to step around both cones, but must slow down and adjust steps to clear cones. 8. Stairs (1) Moderate Impairment: Two feet to a stair, must use rail.  TOTAL SCORE: 13 / 24   05/30/22 3# weights on legs throughout treatment to improve proprioception Sitting: Heel/toe raises LAQ's x 10 each  Standing: Marching x 10 each Heel/toe raises x 10 Hip abduction with shoulder abduction big steps x 10 each holding 1 UE assist 2" step up and over x 5 using bilateral UE assist Standing no UE support holding yellow med ball trunk rotation x 10 each side SLS 3 x 10" each with 1 UE support Cone taps x 5 each foot with 1 UE support   05/25/22  Kept 3# weights  on legs throughout to improve proprioception Heel raises x 10 with 1 UE assist Hip abduction with shoulder abduction combined x 10 each with 1 UE assist 3# marching with 2" hold x 10; 2 hand UE assist; trial of 1 UE assist with max difficulty Figure 8 walking with cones x 5 x 2 with CGA 2" step ups x 5 up and over Toe taps with 3# weights on legs; 3 cones x 5 each 2 UE assist  05/23/22 Step navigation with 2 UE assist, CGA; occassional 1 UE assist x 2  Standing: Heel/ toe raises x 20 with 1 UE assist 2# Marching 2" hold x 10 with 2 UE assist Figure 8 walking x 4 with CGA 2# hip abduction 2# hip extension 2# side stepping in // bars down and back x 3 2" step up and over with 2# weights on legs and 1 UE assist x 5 Toe taps with 2# weights on legs; 2 cones x 5 each 2 UE assist    05/09/22 Review of HEP and  goals Standing: //bars Heel toe raises with 1 UE assist 2 x 10 Mini squats x 10 with 1 UE assist Long steps in // bars x 4 reps with 1 UE assist Sidestepping in // bars x 6 reps with 1 UE assist 1 UE assist marching x 10 2 UE hip abduction x 10 4" step ups x 10 each Toe tap on cones x 2; 5 times each    05/04/2022 physical therapy evaluation and HEP instruction   PATIENT EDUCATION: Education details: Patient educated on exam findings, POC, scope of PT, HEP, and what to expect next treatment. Person educated: Patient Education method: Explanation, Demonstration, and Handouts Education comprehension: verbalized understanding, returned demonstration, verbal cues required, and tactile cues required  HOME EXERCISE PROGRAM: 06/06/22 hip extension and tandem stance 05/09/22 hip abduction, marching in standing  Access Code: [email or web address] URL: [email or web address] Date: 05/04/2022 Prepared by: [facility] - Rehab  Exercises - Heel Toe Raises with Counter Support  - 2 x daily - 7 x weekly - 1 sets - 10 reps - Mini Squat with Counter Support  - 2 x daily - 7 x weekly - 1  sets - 10 reps  GOALS: Goals reviewed with patient? No  SHORT TERM GOALS: Target date: 05/18/2022  patient will be independent with initial HEP   Baseline: Goal status: IN PROGRESS  2.  Patient will self report 30% improvement to improve tolerance for functional activity Baseline:  Goal status: IN PROGRESS   LONG TERM GOALS: Target date: 06/01/2022  Patient will be independent in self management strategies to improve quality of life and functional outcomes.   Baseline:  Goal status: IN PROGRESS  2.  Patient will self report 50% improvement to improve tolerance for functional activity Baseline:  Goal status: IN PROGRESS  3.  Patient will improve FOTO score to predicted value  (61)   Baseline: 54; 80 06/01/22 Goal status: MET  4.  Patient will improve 5 times sit to stand score from 50.27 sec to 30 sec to demonstrate improved functional mobility and increased lower extremity strength.  Baseline: 50.27 sec; 3/21 1 min and 2 sec Goal status: IN PROGRESS  5.  Patient will improve DGI by 8 points (15) to demonstrate improved functional gait and balance with community ambulation Baseline: 7/24; 3/21 13 Goal status: IN PROGRESS   ASSESSMENT:  CLINICAL IMPRESSION: Today's session continued to focus on lower extremity strengthening, gait and balance.  Max challenge with tandem stance today; has one loss of balance with tandem stance where therapist has to assist to regain his balance. Added hip extension; very difficult for patient to perform without significant trunk substitution and rotation.  Updated HEP; encouraged patient to only perform balance activities with someone present for safety.   Patient will benefit from continued skilled therapy services to address deficits and promote return to optimal function.      Eval:Patient is a 32 y.o. male who was seen today for physical therapy evaluation and treatment for cerebellar ataxia. Patient presents to physical therapy with  complaint of leg weakness and imbalance. Patient demonstrates decreased strength, balance deficits and gait abnormalities which are negatively impacting patient ability to perform ADLs and functional mobility tasks. Patient will benefit from skilled physical therapy services to address these deficits to improve level of function with ADLs, functional mobility tasks, and reduce risk for falls.    OBJECTIVE IMPAIRMENTS: Abnormal gait, decreased activity tolerance, decreased balance, decreased coordination, decreased endurance, decreased knowledge of use of DME, decreased mobility, difficulty walking,  decreased ROM, decreased strength, decreased safety awareness, hypomobility, increased fascial restrictions, impaired perceived functional ability, and impaired flexibility.   ACTIVITY LIMITATIONS: carrying, lifting, bending, sitting, standing, squatting, stairs, transfers, bed mobility, bathing, toileting, dressing, hygiene/grooming, locomotion level, and caring for others  PARTICIPATION LIMITATIONS: meal prep, cleaning, laundry, driving, shopping, community activity, occupation, and yard work  [facility] POTENTIAL: Good  CLINICAL DECISION MAKING: Evolving/moderate complexity  EVALUATION COMPLEXITY: Low  PLAN:  PT FREQUENCY: 2x/week  PT DURATION: 4 weeks  PLANNED INTERVENTIONS: Therapeutic exercises, Therapeutic activity, Neuromuscular re-education, Balance training, Gait training, Patient/Family education, Joint manipulation, Joint mobilization, Stair training, Orthotic/Fit training, DME instructions, Aquatic Therapy, Dry Needling, Electrical stimulation, Spinal manipulation, Spinal mobilization, Cryotherapy, Moist heat, Compression bandaging, scar mobilization, Splintting, Taping, Traction, Ultrasound, Ionotophoresis 4mg /ml Dexamethasone, and Manual therapy   PLAN FOR NEXT SESSION:  gait, balance; coordination and lower extremity strength; extend 2 week until 06/29/22   9:44 AM, 06/06/22 Harinder Romas Small  Arek Spadafore MPT [facility] [place] [phone]

## 2022-06-08 ENCOUNTER — Ambulatory Visit (HOSPITAL_COMMUNITY): Payer: Commercial Managed Care - PPO | Admitting: Physical Therapy

## 2022-06-08 ENCOUNTER — Encounter (HOSPITAL_COMMUNITY): Payer: Self-pay | Admitting: Physical Therapy

## 2022-06-08 DIAGNOSIS — R278 Other lack of coordination: Secondary | ICD-10-CM | POA: Diagnosis not present

## 2022-06-08 DIAGNOSIS — R2681 Unsteadiness on feet: Secondary | ICD-10-CM | POA: Diagnosis not present

## 2022-06-08 DIAGNOSIS — R2689 Other abnormalities of gait and mobility: Secondary | ICD-10-CM | POA: Diagnosis not present

## 2022-06-08 DIAGNOSIS — R270 Ataxia, unspecified: Secondary | ICD-10-CM

## 2022-06-08 DIAGNOSIS — R262 Difficulty in walking, not elsewhere classified: Secondary | ICD-10-CM | POA: Diagnosis not present

## 2022-06-08 DIAGNOSIS — M6281 Muscle weakness (generalized): Secondary | ICD-10-CM | POA: Diagnosis not present

## 2022-06-08 NOTE — Therapy (Signed)
OUTPATIENT PHYSICAL THERAPY NEURO TREATMENT   Patient Name: Samuel Stanley MRN: [id number] DOB:March 22, 1990, 32 y.o., male Today's Date: 06/08/2022   PCP: Dr. Gar Ponto, MD REFERRING PROVIDER: Luella Cook, MD  END OF SESSION:  PT End of Session - 06/08/22 0907     Visit Number 8    Number of Visits 12    Date for PT Re-Evaluation 06/29/22    Authorization Type [employer]    PT Start Time [id number]    PT Stop Time 0945    PT Time Calculation (min) 39 min    Equipment Utilized During Treatment Gait belt    Activity Tolerance Patient tolerated treatment well    Behavior During Therapy [facility] for tasks assessed/performed               Past Medical History:  Diagnosis Date   Hypertension    Past Surgical History:  Procedure Laterality Date   HERNIA REPAIR     testicular torsion     Patient Active Problem List   Diagnosis Date Noted   Anterior dislocation of right shoulder 03/01/2016    ONSET DATE: about 5 years   REFERRING DIAG: G11.9 (ICD-10-CM) - Hereditary ataxia, unspecified  THERAPY DIAG:  Unsteadiness on feet  Other lack of coordination  Ataxia  Difficulty in walking, not elsewhere classified  Other abnormalities of gait and mobility  Rationale for Evaluation and Treatment: Rehabilitation  SUBJECTIVE:                                                                                                                                                                                             SUBJECTIVE STATEMENT: States nothing new. Legs are feeling alright. Doesn't have to use the walker at home.   Eval:Issues with balance and leg weakness; cerebellar ataxia.  Has a 38 yo son ; married; symptoms started 5 years ago; gradually worsening; wife is a Marine scientist; has had some PT in [place] but is too far too go.  Grandmother drove him here today. Pt accompanied by: self  PERTINENT HISTORY:   PAIN:  Are you having pain? No  PRECAUTIONS:  Fall  WEIGHT BEARING RESTRICTIONS: No  FALLS: Has patient fallen in last 6 months? No denies falls but wife states he has had several near misses  LIVING ENVIRONMENT: Lives with: lives with their family and lives with their spouse Lives in: House/apartment Stairs: Yes: External: 2 steps; none Has following equipment at home: Vina - 4 wheeled  PLOF: Independent  PATIENT GOALS: get my legs stronger  OBJECTIVE:   DIAGNOSTIC FINDINGS: no recent scans  COGNITION: Overall cognitive status:  Within functional limits for tasks assessed however speech is slow; distorted   SENSATION: WFL  COORDINATION: Impaired   EDEMA:  None noted  MUSCLE TONE:    POSTURE: rounded shoulders and forward head  LOWER EXTREMITY ROM:     Active  Right Eval Left Eval  Hip flexion    Hip extension    Hip abduction    Hip adduction    Hip internal rotation    Hip external rotation    Knee flexion    Knee extension    Ankle dorsiflexion    Ankle plantarflexion    Ankle inversion    Ankle eversion     (Blank rows = not tested)  LOWER EXTREMITY MMT:    MMT Right Eval Left Eval  Hip flexion 4+ 5  Hip extension    Hip abduction    Hip adduction    Hip internal rotation    Hip external rotation    Knee flexion    Knee extension 4+ 4+  Ankle dorsiflexion 5 5  Ankle plantarflexion    Ankle inversion    Ankle eversion    (Blank rows = not tested)  TRANSFERS: Assistive device utilized: None  Sit to stand: SBA Stand to sit: SBA Chair to chair: SBA  STAIRS: Level of Assistance: CGA Stair Negotiation Technique: Alternating Pattern  with Bilateral Rails Number of Stairs: 6  Height of Stairs: 4"  Comments: difficulty with placement of feet on steps  GAIT: Gait pattern: decreased arm swing- Right, decreased arm swing- Left, decreased step length- Right, decreased step length- Left, shuffling, poor foot clearance- Right, and poor foot clearance- Left Distance walked: 50  ft Assistive device utilized: None Level of assistance: CGA Comments: unable to step over obstacle in pathway  FUNCTIONAL TESTS:  5 times sit to stand: 50.27 sec  PATIENT SURVEYS:  FOTO 54  DGI 1. Gait level surface (1) Moderate Impairment: Walks 20', slow speed, abnormal gait pattern, evidence for imbalance. 2. Change in gait speed (1) Moderate Impairment: Makes only minor adjustments to walking speed, or accomplishes a change in speed with significant gait deviations, or changes speed but has significant gait deviations, or changes speed but loses balance but is able to recover and continue walking. 3. Gait with horizontal head turns (1) Moderate Impairment: Performs head turns with moderate change in gait velocity, slows down, staggers but recovers, can continue to walk. 4. Gait with vertical head turns 1) Moderate Impairment: Performs head turns with moderate change in gait velocity, slows down, staggers but recovers, can continue to walk. 5. Gait and pivot turn (1) Moderate Impairment: Turns slowly, requires verbal cueing, requires several small steps to catch balance following turn and stop. 6. Step over obstacle (0) Severe Impairment: Cannot perform without assistance. 7. Step around obstacles (1) Moderate Impairment: Is able to clear cones but must significantly slow, speed to accomplish task, or requires verbal cueing. 8. Stairs (1) Moderate Impairment: Two feet to a stair, must use rail.  TOTAL SCORE: 7 / 24   TODAY'S TREATMENT:  DATE:  06/08/22 Heel/toe raises x 20 with 1 UE assist Side step with UE abduction same side 2x10 each with 1 UE assist LAQ 3# 1 x 10 with 10 second holds Hip extension 3# 2 x 10 each with 1 UE assist Tandem stance 3 x 30" with CGA/min A for safety STS with UE support and overhead reach 1X 10  06/06/2022 Step navigation  with 1 UE assist on railing 4" and 8" steps  Standing: Heel/toe raises x 20 with 1 UE assist Side step with UE abduction same side x 12 each with 1 UE assist Hip extension 3# x 10 each with 1 UE assist Tandem stance 2 x 30" with CGA for safety; trial of no UE assist Marching 3# x 10 each Squats x 10  Sitting: 3# LAQs x 10 each   06/01/22 Progress note FOTO 80 5 x sit to stand  1 min and 16 sec using Ue's to assist 1st trial; 2nd trial 1 min and 2 sec 2nd trial  DGI 1. Gait level surface (2) Mild Impairment: Walks 20', uses assistive devices, slower speed, mild gait deviations. 2. Change in gait speed (1) Moderate Impairment: Makes only minor adjustments to walking speed, or accomplishes a change in speed with significant gait deviations, or changes speed but has significant gait deviations, or changes speed but loses balance but is able to recover and continue walking. 3. Gait with horizontal head turns (2) Mild Impairment: Performs head turns smoothly with slight change in gait velocity, i.e., minor disruption to smooth gait path or uses walking aid. 4. Gait with vertical head turns (2) Mild Impairment: Performs head turns smoothly with slight change in gait velocity, i.e., minor disruption to smooth gait path or uses walking aid. 5. Gait and pivot turn (2) Mild Impairment: Pivot turns safely in > 3 seconds and stops with no loss of balance. 6. Step over obstacle (1) Moderate Impairment: Is able to step over box but must stop, then step over. May require verbal cueing. 7. Step around obstacles (2) Mild Impairment: Is able to step around both cones, but must slow down and adjust steps to clear cones. 8. Stairs (1) Moderate Impairment: Two feet to a stair, must use rail.  TOTAL SCORE: 13 / 24   05/30/22 3# weights on legs throughout treatment to improve proprioception Sitting: Heel/toe raises LAQ's x 10 each  Standing: Marching x 10 each Heel/toe raises x 10 Hip  abduction with shoulder abduction big steps x 10 each holding 1 UE assist 2" step up and over x 5 using bilateral UE assist Standing no UE support holding yellow med ball trunk rotation x 10 each side SLS 3 x 10" each with 1 UE support Cone taps x 5 each foot with 1 UE support   05/25/22  Kept 3# weights on legs throughout to improve proprioception Heel raises x 10 with 1 UE assist Hip abduction with shoulder abduction combined x 10 each with 1 UE assist 3# marching with 2" hold x 10; 2 hand UE assist; trial of 1 UE assist with max difficulty Figure 8 walking with cones x 5 x 2 with CGA 2" step ups x 5 up and over Toe taps with 3# weights on legs; 3 cones x 5 each 2 UE assist  05/23/22 Step navigation with 2 UE assist, CGA; occassional 1 UE assist x 2  Standing: Heel/ toe raises x 20 with 1 UE assist 2# Marching 2" hold x 10 with 2 UE assist Figure  8 walking x 4 with CGA 2# hip abduction 2# hip extension 2# side stepping in // bars down and back x 3 2" step up and over with 2# weights on legs and 1 UE assist x 5 Toe taps with 2# weights on legs; 2 cones x 5 each 2 UE assist    05/09/22 Review of HEP and goals Standing: //bars Heel toe raises with 1 UE assist 2 x 10 Mini squats x 10 with 1 UE assist Long steps in // bars x 4 reps with 1 UE assist Sidestepping in // bars x 6 reps with 1 UE assist 1 UE assist marching x 10 2 UE hip abduction x 10 4" step ups x 10 each Toe tap on cones x 2; 5 times each    05/04/2022 physical therapy evaluation and HEP instruction   PATIENT EDUCATION: Education details: Patient educated on exam findings, POC, scope of PT, HEP, and what to expect next treatment. Person educated: Patient Education method: Explanation, Demonstration, and Handouts Education comprehension: verbalized understanding, returned demonstration, verbal cues required, and tactile cues required  HOME EXERCISE PROGRAM: 06/06/22 hip extension and tandem stance 05/09/22  hip abduction, marching in standing  Access Code: [email or web address] URL: [email or web address] Date: 05/04/2022 Prepared by: [facility] - Rehab  Exercises - Heel Toe Raises with Counter Support  - 2 x daily - 7 x weekly - 1 sets - 10 reps - Mini Squat with Counter Support  - 2 x daily - 7 x weekly - 1 sets - 10 reps  GOALS: Goals reviewed with patient? No  SHORT TERM GOALS: Target date: 05/18/2022  patient will be independent with initial HEP   Baseline: Goal status: IN PROGRESS  2.  Patient will self report 30% improvement to improve tolerance for functional activity Baseline:  Goal status: IN PROGRESS   LONG TERM GOALS: Target date: 06/01/2022  Patient will be independent in self management strategies to improve quality of life and functional outcomes.   Baseline:  Goal status: IN PROGRESS  2.  Patient will self report 50% improvement to improve tolerance for functional activity Baseline:  Goal status: IN PROGRESS  3.  Patient will improve FOTO score to predicted value  (61)   Baseline: 54; 80 06/01/22 Goal status: MET  4.  Patient will improve 5 times sit to stand score from 50.27 sec to 30 sec to demonstrate improved functional mobility and increased lower extremity strength.  Baseline: 50.27 sec; 3/21 1 min and 2 sec Goal status: IN PROGRESS  5.  Patient will improve DGI by 8 points (15) to demonstrate improved functional gait and balance with community ambulation Baseline: 7/24; 3/21 13 Goal status: IN PROGRESS   ASSESSMENT:  CLINICAL IMPRESSION: Continued with LE strengthening and balance training which are tolerated well. Emphasis on unilateral UE support for balance with exercises. Continued unsteadiness with tandem stance requiring CGA/min A for balance along with intermittent UE support.  Patient will continue to benefit from physical therapy in order to improve function and reduce impairment.    Eval:Patient is a 32 y.o. male who was seen today for  physical therapy evaluation and treatment for cerebellar ataxia. Patient presents to physical therapy with complaint of leg weakness and imbalance. Patient demonstrates decreased strength, balance deficits and gait abnormalities which are negatively impacting patient ability to perform ADLs and functional mobility tasks. Patient will benefit from skilled physical therapy services to address these deficits to improve level of function with ADLs, functional mobility tasks, and reduce risk for  falls.    OBJECTIVE IMPAIRMENTS: Abnormal gait, decreased activity tolerance, decreased balance, decreased coordination, decreased endurance, decreased knowledge of use of DME, decreased mobility, difficulty walking, decreased ROM, decreased strength, decreased safety awareness, hypomobility, increased fascial restrictions, impaired perceived functional ability, and impaired flexibility.   ACTIVITY LIMITATIONS: carrying, lifting, bending, sitting, standing, squatting, stairs, transfers, bed mobility, bathing, toileting, dressing, hygiene/grooming, locomotion level, and caring for others  PARTICIPATION LIMITATIONS: meal prep, cleaning, laundry, driving, shopping, community activity, occupation, and yard work  [employer] POTENTIAL: Good  CLINICAL DECISION MAKING: Evolving/moderate complexity  EVALUATION COMPLEXITY: Low  PLAN:  PT FREQUENCY: 2x/week  PT DURATION: 4 weeks  PLANNED INTERVENTIONS: Therapeutic exercises, Therapeutic activity, Neuromuscular re-education, Balance training, Gait training, Patient/Family education, Joint manipulation, Joint mobilization, Stair training, Orthotic/Fit training, DME instructions, Aquatic Therapy, Dry Needling, Electrical stimulation, Spinal manipulation, Spinal mobilization, Cryotherapy, Moist heat, Compression bandaging, scar mobilization, Splintting, Taping, Traction, Ultrasound, Ionotophoresis 4mg /ml Dexamethasone, and Manual therapy   PLAN FOR NEXT SESSION:  gait,  balance; coordination and lower extremity strength; extend 2 week until 06/29/22   9:08 AM, 06/08/22 Mearl Latin PT, DPT Physical Therapist at [facility]

## 2022-06-12 ENCOUNTER — Ambulatory Visit (HOSPITAL_COMMUNITY): Payer: Commercial Managed Care - PPO | Attending: Family Medicine

## 2022-06-12 DIAGNOSIS — R262 Difficulty in walking, not elsewhere classified: Secondary | ICD-10-CM | POA: Insufficient documentation

## 2022-06-12 DIAGNOSIS — R2681 Unsteadiness on feet: Secondary | ICD-10-CM | POA: Diagnosis not present

## 2022-06-12 DIAGNOSIS — R2689 Other abnormalities of gait and mobility: Secondary | ICD-10-CM | POA: Diagnosis not present

## 2022-06-12 DIAGNOSIS — R270 Ataxia, unspecified: Secondary | ICD-10-CM | POA: Insufficient documentation

## 2022-06-12 DIAGNOSIS — R278 Other lack of coordination: Secondary | ICD-10-CM | POA: Insufficient documentation

## 2022-06-12 DIAGNOSIS — M6281 Muscle weakness (generalized): Secondary | ICD-10-CM | POA: Insufficient documentation

## 2022-06-12 NOTE — Therapy (Signed)
OUTPATIENT PHYSICAL THERAPY NEURO TREATMENT   Patient Name: Samuel Stanley MRN: [id number] DOB:16-Mar-1990, 32 y.o., male Today's Date: 06/12/2022   PCP: Dr. Gar Ponto, MD REFERRING PROVIDER: Luella Cook, MD  END OF SESSION:  PT End of Session - 06/12/22 1441     Visit Number 9    Number of Visits 12    Date for PT Re-Evaluation 06/29/22    Authorization Type [employer]    PT Start Time 0239    PT Stop Time 0317    PT Time Calculation (min) 38 min    Equipment Utilized During Treatment Gait belt    Activity Tolerance Patient tolerated treatment well    Behavior During Therapy [facility] for tasks assessed/performed                Past Medical History:  Diagnosis Date   Hypertension    Past Surgical History:  Procedure Laterality Date   HERNIA REPAIR     testicular torsion     Patient Active Problem List   Diagnosis Date Noted   Anterior dislocation of right shoulder 03/01/2016    ONSET DATE: about 5 years   REFERRING DIAG: G11.9 (ICD-10-CM) - Hereditary ataxia, unspecified  THERAPY DIAG:  Unsteadiness on feet  Other lack of coordination  Ataxia  Difficulty in walking, not elsewhere classified  Other abnormalities of gait and mobility  Muscle weakness (generalized)  Rationale for Evaluation and Treatment: Rehabilitation  SUBJECTIVE:                                                                                                                                                                                             SUBJECTIVE STATEMENT: Patient arrives without his walker today; states he " is fine" walking without it coming into the PT gym.  Eval:Issues with balance and leg weakness; cerebellar ataxia.  Has a 40 yo son ; married; symptoms started 5 years ago; gradually worsening; wife is a Marine scientist; has had some PT in [place] but is too far too go.  Grandmother drove him here today. Pt accompanied by: self  PERTINENT HISTORY:    PAIN:  Are you having pain? No  PRECAUTIONS: Fall  WEIGHT BEARING RESTRICTIONS: No  FALLS: Has patient fallen in last 6 months? No denies falls but wife states he has had several near misses  LIVING ENVIRONMENT: Lives with: lives with their family and lives with their spouse Lives in: House/apartment Stairs: Yes: External: 2 steps; none Has following equipment at home: [place] - 4 wheeled  PLOF: Independent  PATIENT GOALS: get my legs stronger  OBJECTIVE:   DIAGNOSTIC FINDINGS:  no recent scans  COGNITION: Overall cognitive status: Within functional limits for tasks assessed however speech is slow; distorted   SENSATION: WFL  COORDINATION: Impaired   EDEMA:  None noted  MUSCLE TONE:    POSTURE: rounded shoulders and forward head  LOWER EXTREMITY ROM:     Active  Right Eval Left Eval  Hip flexion    Hip extension    Hip abduction    Hip adduction    Hip internal rotation    Hip external rotation    Knee flexion    Knee extension    Ankle dorsiflexion    Ankle plantarflexion    Ankle inversion    Ankle eversion     (Blank rows = not tested)  LOWER EXTREMITY MMT:    MMT Right Eval Left Eval  Hip flexion 4+ 5  Hip extension    Hip abduction    Hip adduction    Hip internal rotation    Hip external rotation    Knee flexion    Knee extension 4+ 4+  Ankle dorsiflexion 5 5  Ankle plantarflexion    Ankle inversion    Ankle eversion    (Blank rows = not tested)  TRANSFERS: Assistive device utilized: None  Sit to stand: SBA Stand to sit: SBA Chair to chair: SBA  STAIRS: Level of Assistance: CGA Stair Negotiation Technique: Alternating Pattern  with Bilateral Rails Number of Stairs: 6  Height of Stairs: 4"  Comments: difficulty with placement of feet on steps  GAIT: Gait pattern: decreased arm swing- Right, decreased arm swing- Left, decreased step length- Right, decreased step length- Left, shuffling, poor foot clearance- Right, and  poor foot clearance- Left Distance walked: 50 ft Assistive device utilized: None Level of assistance: CGA Comments: unable to step over obstacle in pathway  FUNCTIONAL TESTS:  5 times sit to stand: 50.27 sec  PATIENT SURVEYS:  FOTO 54  DGI 1. Gait level surface (1) Moderate Impairment: Walks 20', slow speed, abnormal gait pattern, evidence for imbalance. 2. Change in gait speed (1) Moderate Impairment: Makes only minor adjustments to walking speed, or accomplishes a change in speed with significant gait deviations, or changes speed but has significant gait deviations, or changes speed but loses balance but is able to recover and continue walking. 3. Gait with horizontal head turns (1) Moderate Impairment: Performs head turns with moderate change in gait velocity, slows down, staggers but recovers, can continue to walk. 4. Gait with vertical head turns 1) Moderate Impairment: Performs head turns with moderate change in gait velocity, slows down, staggers but recovers, can continue to walk. 5. Gait and pivot turn (1) Moderate Impairment: Turns slowly, requires verbal cueing, requires several small steps to catch balance following turn and stop. 6. Step over obstacle (0) Severe Impairment: Cannot perform without assistance. 7. Step around obstacles (1) Moderate Impairment: Is able to clear cones but must significantly slow, speed to accomplish task, or requires verbal cueing. 8. Stairs (1) Moderate Impairment: Two feet to a stair, must use rail.  TOTAL SCORE: 7 / 24   TODAY'S TREATMENT:  DATE:  06/12/22 Heel/toe raises x 20 with 1 UE assist Side stepping with UE abduction x 10 each side with 3# weight Marching with 3# weights x 10 each with 1 UE assist; occasionally needs 2 Hip extension 3# x 10 each Cone taps x 2 x 5 each leg Squats x 10 with 1 UE assist 2"  step up and overs x 5 over and back; CGA Tandem stance   Sitting: LAQ's 3# 1 x 10 with 2" hold   06/08/22 Heel/toe raises x 20 with 1 UE assist Side step with UE abduction same side 2x10 each with 1 UE assist LAQ 3# 1 x 10 with 10 second holds Hip extension 3# 2 x 10 each with 1 UE assist Tandem stance 3 x 30" with CGA/min A for safety STS with UE support and overhead reach 1X 10  06/06/2022 Step navigation with 1 UE assist on railing 4" and 8" steps  Standing: Heel/toe raises x 20 with 1 UE assist Side step with UE abduction same side x 12 each with 1 UE assist Hip extension 3# x 10 each with 1 UE assist Tandem stance 2 x 30" with CGA for safety; trial of no UE assist Marching 3# x 10 each Squats x 10  Sitting: 3# LAQs x 10 each   06/01/22 Progress note FOTO 80 5 x sit to stand  1 min and 16 sec using Ue's to assist 1st trial; 2nd trial 1 min and 2 sec 2nd trial  DGI 1. Gait level surface (2) Mild Impairment: Walks 20', uses assistive devices, slower speed, mild gait deviations. 2. Change in gait speed (1) Moderate Impairment: Makes only minor adjustments to walking speed, or accomplishes a change in speed with significant gait deviations, or changes speed but has significant gait deviations, or changes speed but loses balance but is able to recover and continue walking. 3. Gait with horizontal head turns (2) Mild Impairment: Performs head turns smoothly with slight change in gait velocity, i.e., minor disruption to smooth gait path or uses walking aid. 4. Gait with vertical head turns (2) Mild Impairment: Performs head turns smoothly with slight change in gait velocity, i.e., minor disruption to smooth gait path or uses walking aid. 5. Gait and pivot turn (2) Mild Impairment: Pivot turns safely in > 3 seconds and stops with no loss of balance. 6. Step over obstacle (1) Moderate Impairment: Is able to step over box but must stop, then step over. May require verbal  cueing. 7. Step around obstacles (2) Mild Impairment: Is able to step around both cones, but must slow down and adjust steps to clear cones. 8. Stairs (1) Moderate Impairment: Two feet to a stair, must use rail.  TOTAL SCORE: 13 / 24   05/30/22 3# weights on legs throughout treatment to improve proprioception Sitting: Heel/toe raises LAQ's x 10 each  Standing: Marching x 10 each Heel/toe raises x 10 Hip abduction with shoulder abduction big steps x 10 each holding 1 UE assist 2" step up and over x 5 using bilateral UE assist Standing no UE support holding yellow med ball trunk rotation x 10 each side SLS 3 x 10" each with 1 UE support Cone taps x 5 each foot with 1 UE support   05/25/22  Kept 3# weights on legs throughout to improve proprioception Heel raises x 10 with 1 UE assist Hip abduction with shoulder abduction combined x 10 each with 1 UE assist 3# marching with 2" hold x 10; 2  hand UE assist; trial of 1 UE assist with max difficulty Figure 8 walking with cones x 5 x 2 with CGA 2" step ups x 5 up and over Toe taps with 3# weights on legs; 3 cones x 5 each 2 UE assist  05/23/22 Step navigation with 2 UE assist, CGA; occassional 1 UE assist x 2  Standing: Heel/ toe raises x 20 with 1 UE assist 2# Marching 2" hold x 10 with 2 UE assist Figure 8 walking x 4 with CGA 2# hip abduction 2# hip extension 2# side stepping in // bars down and back x 3 2" step up and over with 2# weights on legs and 1 UE assist x 5 Toe taps with 2# weights on legs; 2 cones x 5 each 2 UE assist    05/09/22 Review of HEP and goals Standing: //bars Heel toe raises with 1 UE assist 2 x 10 Mini squats x 10 with 1 UE assist Long steps in // bars x 4 reps with 1 UE assist Sidestepping in // bars x 6 reps with 1 UE assist 1 UE assist marching x 10 2 UE hip abduction x 10 4" step ups x 10 each Toe tap on cones x 2; 5 times each    05/04/2022 physical therapy evaluation and HEP  instruction   PATIENT EDUCATION: Education details: Patient educated on exam findings, POC, scope of PT, HEP, and what to expect next treatment. Person educated: Patient Education method: Explanation, Demonstration, and Handouts Education comprehension: verbalized understanding, returned demonstration, verbal cues required, and tactile cues required  HOME EXERCISE PROGRAM: 06/06/22 hip extension and tandem stance 05/09/22 hip abduction, marching in standing  Access Code: [email or web address] URL: [email or web address] Date: 05/04/2022 Prepared by: [facility] - Rehab  Exercises - Heel Toe Raises with Counter Support  - 2 x daily - 7 x weekly - 1 sets - 10 reps - Mini Squat with Counter Support  - 2 x daily - 7 x weekly - 1 sets - 10 reps  GOALS: Goals reviewed with patient? No  SHORT TERM GOALS: Target date: 05/18/2022  patient will be independent with initial HEP   Baseline: Goal status: IN PROGRESS  2.  Patient will self report 30% improvement to improve tolerance for functional activity Baseline:  Goal status: IN PROGRESS   LONG TERM GOALS: Target date: 06/01/2022  Patient will be independent in self management strategies to improve quality of life and functional outcomes.   Baseline:  Goal status: IN PROGRESS  2.  Patient will self report 50% improvement to improve tolerance for functional activity Baseline:  Goal status: IN PROGRESS  3.  Patient will improve FOTO score to predicted value  (61)   Baseline: 54; 80 06/01/22 Goal status: MET  4.  Patient will improve 5 times sit to stand score from 50.27 sec to 30 sec to demonstrate improved functional mobility and increased lower extremity strength.  Baseline: 50.27 sec; 3/21 1 min and 2 sec Goal status: IN PROGRESS  5.  Patient will improve DGI by 8 points (15) to demonstrate improved functional gait and balance with community ambulation Baseline: 7/24; 3/21 13 Goal status: IN  PROGRESS   ASSESSMENT:  CLINICAL IMPRESSION: Continued with LE strengthening and balance training; continued with max challenge with tandem stance; encouraged patient to use his rolling walker for safety.  He verbalizes agreement.  Continues with difficulty with controlled movements.    Patient will continue to benefit from physical therapy in order to improve function and reduce  impairment.    Eval:Patient is a 32 y.o. male who was seen today for physical therapy evaluation and treatment for cerebellar ataxia. Patient presents to physical therapy with complaint of leg weakness and imbalance. Patient demonstrates decreased strength, balance deficits and gait abnormalities which are negatively impacting patient ability to perform ADLs and functional mobility tasks. Patient will benefit from skilled physical therapy services to address these deficits to improve level of function with ADLs, functional mobility tasks, and reduce risk for falls.    OBJECTIVE IMPAIRMENTS: Abnormal gait, decreased activity tolerance, decreased balance, decreased coordination, decreased endurance, decreased knowledge of use of DME, decreased mobility, difficulty walking, decreased ROM, decreased strength, decreased safety awareness, hypomobility, increased fascial restrictions, impaired perceived functional ability, and impaired flexibility.   ACTIVITY LIMITATIONS: carrying, lifting, bending, sitting, standing, squatting, stairs, transfers, bed mobility, bathing, toileting, dressing, hygiene/grooming, locomotion level, and caring for others  PARTICIPATION LIMITATIONS: meal prep, cleaning, laundry, driving, shopping, community activity, occupation, and yard work  [employer] POTENTIAL: Good  CLINICAL DECISION MAKING: Evolving/moderate complexity  EVALUATION COMPLEXITY: Low  PLAN:  PT FREQUENCY: 2x/week  PT DURATION: 4 weeks  PLANNED INTERVENTIONS: Therapeutic exercises, Therapeutic activity, Neuromuscular  re-education, Balance training, Gait training, Patient/Family education, Joint manipulation, Joint mobilization, Stair training, Orthotic/Fit training, DME instructions, Aquatic Therapy, Dry Needling, Electrical stimulation, Spinal manipulation, Spinal mobilization, Cryotherapy, Moist heat, Compression bandaging, scar mobilization, Splintting, Taping, Traction, Ultrasound, Ionotophoresis 4mg /ml Dexamethasone, and Manual therapy   PLAN FOR NEXT SESSION:  gait, balance; coordination and lower extremity strength; extend 2 week until 06/29/22  3:23 PM, 06/12/22 Aleric Froelich [place] Kimberlin MPT [facility] [place] [phone] Ph:[phone]

## 2022-06-19 ENCOUNTER — Ambulatory Visit (HOSPITAL_COMMUNITY): Payer: Commercial Managed Care - PPO

## 2022-06-19 DIAGNOSIS — R262 Difficulty in walking, not elsewhere classified: Secondary | ICD-10-CM

## 2022-06-19 DIAGNOSIS — R2681 Unsteadiness on feet: Secondary | ICD-10-CM | POA: Diagnosis not present

## 2022-06-19 DIAGNOSIS — M6281 Muscle weakness (generalized): Secondary | ICD-10-CM | POA: Diagnosis not present

## 2022-06-19 DIAGNOSIS — R278 Other lack of coordination: Secondary | ICD-10-CM | POA: Diagnosis not present

## 2022-06-19 DIAGNOSIS — R2689 Other abnormalities of gait and mobility: Secondary | ICD-10-CM | POA: Diagnosis not present

## 2022-06-19 DIAGNOSIS — R270 Ataxia, unspecified: Secondary | ICD-10-CM | POA: Diagnosis not present

## 2022-06-19 NOTE — Therapy (Signed)
OUTPATIENT PHYSICAL THERAPY NEURO TREATMENT   Patient Name: Samuel Stanley MRN: [id number] DOB:08-07-1990, 32 y.o., male Today's Date: 06/19/2022   PCP: [name], MD REFERRING PROVIDER: [name], MD  END OF SESSION:  PT End of Session - 06/19/22 1440     Visit Number 10    Number of Visits 12    Date for PT Re-Evaluation 06/29/22    Authorization Type [facility] [employer]    PT Start Time 1440   late check in   PT Stop Time 1515    PT Time Calculation (min) 35 min    Equipment Utilized During Treatment Gait belt    Activity Tolerance Patient tolerated treatment well    Behavior During Therapy [facility] for tasks assessed/performed                Past Medical History:  Diagnosis Date   Hypertension    Past Surgical History:  Procedure Laterality Date   HERNIA REPAIR     testicular torsion     Patient Active Problem List   Diagnosis Date Noted   Anterior dislocation of right shoulder 03/01/2016    ONSET DATE: about 5 years   REFERRING DIAG: G11.9 (ICD-10-CM) - Hereditary ataxia, unspecified  THERAPY DIAG:  Unsteadiness on feet  Other lack of coordination  Ataxia  Difficulty in walking, not elsewhere classified  Rationale for Evaluation and Treatment: Rehabilitation  SUBJECTIVE:                                                                                                                                                                                             SUBJECTIVE STATEMENT: Late arrival; reports no new falls; no pain  Eval:Issues with balance and leg weakness; cerebellar ataxia.  Has a 96 yo son ; married; symptoms started 5 years ago; gradually worsening; wife is a Engineer, civil (consulting); has had some PT in [place] but is too far too go.  Grandmother drove him here today. Pt accompanied by: self  PERTINENT HISTORY:   PAIN:  Are you having pain? No  PRECAUTIONS: Fall  WEIGHT BEARING RESTRICTIONS: No  FALLS: Has patient fallen in last 6  months? No denies falls but wife states he has had several near misses  LIVING ENVIRONMENT: Lives with: lives with their family and lives with their spouse Lives in: House/apartment Stairs: Yes: External: 2 steps; none Has following equipment at home: Environmental consultant - 4 wheeled  PLOF: Independent  PATIENT GOALS: get my legs stronger  OBJECTIVE:   DIAGNOSTIC FINDINGS: no recent scans  COGNITION: Overall cognitive status: Within functional limits for tasks assessed however speech is slow;  distorted   SENSATION: WFL  COORDINATION: Impaired   EDEMA:  None noted  MUSCLE TONE:    POSTURE: rounded shoulders and forward head  LOWER EXTREMITY ROM:     Active  Right Eval Left Eval  Hip flexion    Hip extension    Hip abduction    Hip adduction    Hip internal rotation    Hip external rotation    Knee flexion    Knee extension    Ankle dorsiflexion    Ankle plantarflexion    Ankle inversion    Ankle eversion     (Blank rows = not tested)  LOWER EXTREMITY MMT:    MMT Right Eval Left Eval  Hip flexion 4+ 5  Hip extension    Hip abduction    Hip adduction    Hip internal rotation    Hip external rotation    Knee flexion    Knee extension 4+ 4+  Ankle dorsiflexion 5 5  Ankle plantarflexion    Ankle inversion    Ankle eversion    (Blank rows = not tested)  TRANSFERS: Assistive device utilized: None  Sit to stand: SBA Stand to sit: SBA Chair to chair: SBA  STAIRS: Level of Assistance: CGA Stair Negotiation Technique: Alternating Pattern  with Bilateral Rails Number of Stairs: 6  Height of Stairs: 4"  Comments: difficulty with placement of feet on steps  GAIT: Gait pattern: decreased arm swing- Right, decreased arm swing- Left, decreased step length- Right, decreased step length- Left, shuffling, poor foot clearance- Right, and poor foot clearance- Left Distance walked: 50 ft Assistive device utilized: None Level of assistance: CGA Comments: unable to  step over obstacle in pathway  FUNCTIONAL TESTS:  5 times sit to stand: 50.27 sec  PATIENT SURVEYS:  FOTO 54  DGI 1. Gait level surface (1) Moderate Impairment: Walks 20', slow speed, abnormal gait pattern, evidence for imbalance. 2. Change in gait speed (1) Moderate Impairment: Makes only minor adjustments to walking speed, or accomplishes a change in speed with significant gait deviations, or changes speed but has significant gait deviations, or changes speed but loses balance but is able to recover and continue walking. 3. Gait with horizontal head turns (1) Moderate Impairment: Performs head turns with moderate change in gait velocity, slows down, staggers but recovers, can continue to walk. 4. Gait with vertical head turns 1) Moderate Impairment: Performs head turns with moderate change in gait velocity, slows down, staggers but recovers, can continue to walk. 5. Gait and pivot turn (1) Moderate Impairment: Turns slowly, requires verbal cueing, requires several small steps to catch balance following turn and stop. 6. Step over obstacle (0) Severe Impairment: Cannot perform without assistance. 7. Step around obstacles (1) Moderate Impairment: Is able to clear cones but must significantly slow, speed to accomplish task, or requires verbal cueing. 8. Stairs (1) Moderate Impairment: Two feet to a stair, must use rail.  TOTAL SCORE: 7 / 24   TODAY'S TREATMENT:  DATE:  06/19/22 5 x sit to stand 59.47 sec with bilateral UE assist  Seated: LAQ's 5# x 10  Standing: 5# on legs sidestepping with same side arm abduction x 10 each 5# marching x 5 each leg Squats x 10 5# hip extension x 10 4" box step up and overs x 5 with 1 UE assist Sidestepping 1 HHA x 4 // bars Heel toe walking with 2 UE assist down and back // bars  06/12/22 Heel/toe raises x 20 with 1 UE  assist Side stepping with UE abduction x 10 each side with 3# weight Marching with 3# weights x 10 each with 1 UE assist; occasionally needs 2 Hip extension 3# x 10 each Cone taps x 2 x 5 each leg Squats x 10 with 1 UE assist 2" step up and overs x 5 over and back; CGA Tandem stance   Sitting: LAQ's 3# 1 x 10 with 2" hold   06/08/22 Heel/toe raises x 20 with 1 UE assist Side step with UE abduction same side 2x10 each with 1 UE assist LAQ 3# 1 x 10 with 10 second holds Hip extension 3# 2 x 10 each with 1 UE assist Tandem stance 3 x 30" with CGA/min A for safety STS with UE support and overhead reach 1X 10  06/06/2022 Step navigation with 1 UE assist on railing 4" and 8" steps  Standing: Heel/toe raises x 20 with 1 UE assist Side step with UE abduction same side x 12 each with 1 UE assist Hip extension 3# x 10 each with 1 UE assist Tandem stance 2 x 30" with CGA for safety; trial of no UE assist Marching 3# x 10 each Squats x 10  Sitting: 3# LAQs x 10 each   06/01/22 Progress note FOTO 80 5 x sit to stand  1 min and 16 sec using Ue's to assist 1st trial; 2nd trial 1 min and 2 sec 2nd trial  DGI 1. Gait level surface (2) Mild Impairment: Walks 20', uses assistive devices, slower speed, mild gait deviations. 2. Change in gait speed (1) Moderate Impairment: Makes only minor adjustments to walking speed, or accomplishes a change in speed with significant gait deviations, or changes speed but has significant gait deviations, or changes speed but loses balance but is able to recover and continue walking. 3. Gait with horizontal head turns (2) Mild Impairment: Performs head turns smoothly with slight change in gait velocity, i.e., minor disruption to smooth gait path or uses walking aid. 4. Gait with vertical head turns (2) Mild Impairment: Performs head turns smoothly with slight change in gait velocity, i.e., minor disruption to smooth gait path or uses walking aid. 5. Gait  and pivot turn (2) Mild Impairment: Pivot turns safely in > 3 seconds and stops with no loss of balance. 6. Step over obstacle (1) Moderate Impairment: Is able to step over box but must stop, then step over. May require verbal cueing. 7. Step around obstacles (2) Mild Impairment: Is able to step around both cones, but must slow down and adjust steps to clear cones. 8. Stairs (1) Moderate Impairment: Two feet to a stair, must use rail.  TOTAL SCORE: 13 / 24   05/30/22 3# weights on legs throughout treatment to improve proprioception Sitting: Heel/toe raises LAQ's x 10 each  Standing: Marching x 10 each Heel/toe raises x 10 Hip abduction with shoulder abduction big steps x 10 each holding 1 UE assist 2" step up and over x 5 using  bilateral UE assist Standing no UE support holding yellow med ball trunk rotation x 10 each side SLS 3 x 10" each with 1 UE support Cone taps x 5 each foot with 1 UE support   05/25/22  Kept 3# weights on legs throughout to improve proprioception Heel raises x 10 with 1 UE assist Hip abduction with shoulder abduction combined x 10 each with 1 UE assist 3# marching with 2" hold x 10; 2 hand UE assist; trial of 1 UE assist with max difficulty Figure 8 walking with cones x 5 x 2 with CGA 2" step ups x 5 up and over Toe taps with 3# weights on legs; 3 cones x 5 each 2 UE assist  05/23/22 Step navigation with 2 UE assist, CGA; occassional 1 UE assist x 2  Standing: Heel/ toe raises x 20 with 1 UE assist 2# Marching 2" hold x 10 with 2 UE assist Figure 8 walking x 4 with CGA 2# hip abduction 2# hip extension 2# side stepping in // bars down and back x 3 2" step up and over with 2# weights on legs and 1 UE assist x 5 Toe taps with 2# weights on legs; 2 cones x 5 each 2 UE assist    05/09/22 Review of HEP and goals Standing: //bars Heel toe raises with 1 UE assist 2 x 10 Mini squats x 10 with 1 UE assist Long steps in // bars x 4 reps with 1 UE  assist Sidestepping in // bars x 6 reps with 1 UE assist 1 UE assist marching x 10 2 UE hip abduction x 10 4" step ups x 10 each Toe tap on cones x 2; 5 times each    05/04/2022 physical therapy evaluation and HEP instruction   PATIENT EDUCATION: Education details: Patient educated on exam findings, POC, scope of PT, HEP, and what to expect next treatment. Person educated: Patient Education method: Explanation, Demonstration, and Handouts Education comprehension: verbalized understanding, returned demonstration, verbal cues required, and tactile cues required  HOME EXERCISE PROGRAM: 06/06/22 hip extension and tandem stance 05/09/22 hip abduction, marching in standing  Access Code: [email or web address] URL: [email or web address] Date: 05/04/2022 Prepared by: [facility] - Rehab  Exercises - Heel Toe Raises with Counter Support  - 2 x daily - 7 x weekly - 1 sets - 10 reps - Mini Squat with Counter Support  - 2 x daily - 7 x weekly - 1 sets - 10 reps  GOALS: Goals reviewed with patient? No  SHORT TERM GOALS: Target date: 05/18/2022  patient will be independent with initial HEP   Baseline: Goal status: IN PROGRESS  2.  Patient will self report 30% improvement to improve tolerance for functional activity Baseline:  Goal status: IN PROGRESS   LONG TERM GOALS: Target date: 06/01/2022  Patient will be independent in self management strategies to improve quality of life and functional outcomes.   Baseline:  Goal status: IN PROGRESS  2.  Patient will self report 50% improvement to improve tolerance for functional activity Baseline:  Goal status: IN PROGRESS  3.  Patient will improve FOTO score to predicted value  (61)   Baseline: 54; 80 06/01/22 Goal status: MET  4.  Patient will improve 5 times sit to stand score from 50.27 sec to 30 sec to demonstrate improved functional mobility and increased lower extremity strength.  Baseline: 50.27 sec; 3/21 1 min and 2 sec Goal  status: IN PROGRESS  5.  Patient will improve DGI by 8  points (15) to demonstrate improved functional gait and balance with community ambulation Baseline: 7/24; 3/21 13 Goal status: IN PROGRESS   ASSESSMENT:  CLINICAL IMPRESSION: Late arrival today.   Today's session continued to focus on lower extremity strengthening and balance.  Increased LAQ's to 5#; patient with difficulty isolating movements; tends to have accessory movements with all exercises.  Needs cues for avoiding substition with trunk flexion with hip extension exercise.  Increased box step up and overs to 4" today with good challenge.   Patient will continue to benefit from physical therapy in order to improve function and reduce impairment.    Eval:Patient is a 32 y.o. male who was seen today for physical therapy evaluation and treatment for cerebellar ataxia. Patient presents to physical therapy with complaint of leg weakness and imbalance. Patient demonstrates decreased strength, balance deficits and gait abnormalities which are negatively impacting patient ability to perform ADLs and functional mobility tasks. Patient will benefit from skilled physical therapy services to address these deficits to improve level of function with ADLs, functional mobility tasks, and reduce risk for falls.    OBJECTIVE IMPAIRMENTS: Abnormal gait, decreased activity tolerance, decreased balance, decreased coordination, decreased endurance, decreased knowledge of use of DME, decreased mobility, difficulty walking, decreased ROM, decreased strength, decreased safety awareness, hypomobility, increased fascial restrictions, impaired perceived functional ability, and impaired flexibility.   ACTIVITY LIMITATIONS: carrying, lifting, bending, sitting, standing, squatting, stairs, transfers, bed mobility, bathing, toileting, dressing, hygiene/grooming, locomotion level, and caring for others  PARTICIPATION LIMITATIONS: meal prep, cleaning, laundry, driving,  shopping, community activity, occupation, and yard work  [facility] POTENTIAL: Good  CLINICAL DECISION MAKING: Evolving/moderate complexity  EVALUATION COMPLEXITY: Low  PLAN:  PT FREQUENCY: 2x/week  PT DURATION: 4 weeks  PLANNED INTERVENTIONS: Therapeutic exercises, Therapeutic activity, Neuromuscular re-education, Balance training, Gait training, Patient/Family education, Joint manipulation, Joint mobilization, Stair training, Orthotic/Fit training, DME instructions, Aquatic Therapy, Dry Needling, Electrical stimulation, Spinal manipulation, Spinal mobilization, Cryotherapy, Moist heat, Compression bandaging, scar mobilization, Splintting, Taping, Traction, Ultrasound, Ionotophoresis 4mg /ml Dexamethasone, and Manual therapy   PLAN FOR NEXT SESSION:  gait, balance; coordination and lower extremity strength; extend 2 week until 06/29/22  3:17 PM, 06/19/22 Eveleen Mcnear Small Freemon Binford MPT [place] [facility] [place] [phone] Ph:[phone]

## 2022-06-22 ENCOUNTER — Ambulatory Visit (HOSPITAL_COMMUNITY): Payer: Commercial Managed Care - PPO

## 2022-06-22 DIAGNOSIS — R2681 Unsteadiness on feet: Secondary | ICD-10-CM

## 2022-06-22 DIAGNOSIS — M6281 Muscle weakness (generalized): Secondary | ICD-10-CM | POA: Diagnosis not present

## 2022-06-22 DIAGNOSIS — R270 Ataxia, unspecified: Secondary | ICD-10-CM

## 2022-06-22 DIAGNOSIS — R278 Other lack of coordination: Secondary | ICD-10-CM

## 2022-06-22 DIAGNOSIS — R2689 Other abnormalities of gait and mobility: Secondary | ICD-10-CM | POA: Diagnosis not present

## 2022-06-22 DIAGNOSIS — R262 Difficulty in walking, not elsewhere classified: Secondary | ICD-10-CM | POA: Diagnosis not present

## 2022-06-22 NOTE — Therapy (Signed)
OUTPATIENT PHYSICAL THERAPY NEURO TREATMENT   Patient Name: Samuel Stanley MRN: [id number] DOB:Jul 13, 1990, 32 y.o., male Today's Date: 06/22/2022   PCP: [name], MD REFERRING PROVIDER: [name], MD  END OF SESSION:  PT End of Session - 06/22/22 1040     Visit Number 11    Number of Visits 12    Date for PT Re-Evaluation 06/29/22    Authorization Type [facility] [employer]    PT Start Time 1037    PT Stop Time 1115    PT Time Calculation (min) 38 min    Equipment Utilized During Treatment Gait belt    Activity Tolerance Patient tolerated treatment well    Behavior During Therapy [facility] for tasks assessed/performed                Past Medical History:  Diagnosis Date   Hypertension    Past Surgical History:  Procedure Laterality Date   HERNIA REPAIR     testicular torsion     Patient Active Problem List   Diagnosis Date Noted   Anterior dislocation of right shoulder 03/01/2016    ONSET DATE: about 5 years   REFERRING DIAG: G11.9 (ICD-10-CM) - Hereditary ataxia, unspecified  THERAPY DIAG:  Unsteadiness on feet  Other lack of coordination  Ataxia  Difficulty in walking, not elsewhere classified  Rationale for Evaluation and Treatment: Rehabilitation  SUBJECTIVE:                                                                                                                                                                                             SUBJECTIVE STATEMENT: Late arrival; reports no new falls; no pain  Eval:Issues with balance and leg weakness; cerebellar ataxia.  Has a 10 yo son ; married; symptoms started 5 years ago; gradually worsening; wife is a Engineer, civil (consulting); has had some PT in [place] but is too far too go.  Grandmother drove him here today. Pt accompanied by: self  PERTINENT HISTORY:   PAIN:  Are you having pain? No  PRECAUTIONS: Fall  WEIGHT BEARING RESTRICTIONS: No  FALLS: Has patient fallen in last 6 months? No  denies falls but wife states he has had several near misses  LIVING ENVIRONMENT: Lives with: lives with their family and lives with their spouse Lives in: House/apartment Stairs: Yes: External: 2 steps; none Has following equipment at home: Environmental consultant - 4 wheeled  PLOF: Independent  PATIENT GOALS: get my legs stronger  OBJECTIVE:   DIAGNOSTIC FINDINGS: no recent scans  COGNITION: Overall cognitive status: Within functional limits for tasks assessed however speech is slow; distorted   SENSATION:  WFL  COORDINATION: Impaired   EDEMA:  None noted  MUSCLE TONE:    POSTURE: rounded shoulders and forward head  LOWER EXTREMITY ROM:     Active  Right Eval Left Eval  Hip flexion    Hip extension    Hip abduction    Hip adduction    Hip internal rotation    Hip external rotation    Knee flexion    Knee extension    Ankle dorsiflexion    Ankle plantarflexion    Ankle inversion    Ankle eversion     (Blank rows = not tested)  LOWER EXTREMITY MMT:    MMT Right Eval Left Eval  Hip flexion 4+ 5  Hip extension    Hip abduction    Hip adduction    Hip internal rotation    Hip external rotation    Knee flexion    Knee extension 4+ 4+  Ankle dorsiflexion 5 5  Ankle plantarflexion    Ankle inversion    Ankle eversion    (Blank rows = not tested)  TRANSFERS: Assistive device utilized: None  Sit to stand: SBA Stand to sit: SBA Chair to chair: SBA  STAIRS: Level of Assistance: CGA Stair Negotiation Technique: Alternating Pattern  with Bilateral Rails Number of Stairs: 6  Height of Stairs: 4"  Comments: difficulty with placement of feet on steps  GAIT: Gait pattern: decreased arm swing- Right, decreased arm swing- Left, decreased step length- Right, decreased step length- Left, shuffling, poor foot clearance- Right, and poor foot clearance- Left Distance walked: 50 ft Assistive device utilized: None Level of assistance: CGA Comments: unable to step over  obstacle in pathway  FUNCTIONAL TESTS:  5 times sit to stand: 50.27 sec  PATIENT SURVEYS:  FOTO 54  DGI 1. Gait level surface (1) Moderate Impairment: Walks 20', slow speed, abnormal gait pattern, evidence for imbalance. 2. Change in gait speed (1) Moderate Impairment: Makes only minor adjustments to walking speed, or accomplishes a change in speed with significant gait deviations, or changes speed but has significant gait deviations, or changes speed but loses balance but is able to recover and continue walking. 3. Gait with horizontal head turns (1) Moderate Impairment: Performs head turns with moderate change in gait velocity, slows down, staggers but recovers, can continue to walk. 4. Gait with vertical head turns 1) Moderate Impairment: Performs head turns with moderate change in gait velocity, slows down, staggers but recovers, can continue to walk. 5. Gait and pivot turn (1) Moderate Impairment: Turns slowly, requires verbal cueing, requires several small steps to catch balance following turn and stop. 6. Step over obstacle (0) Severe Impairment: Cannot perform without assistance. 7. Step around obstacles (1) Moderate Impairment: Is able to clear cones but must significantly slow, speed to accomplish task, or requires verbal cueing. 8. Stairs (1) Moderate Impairment: Two feet to a stair, must use rail.  TOTAL SCORE: 7 / 24   TODAY'S TREATMENT:  DATE:  06/22/22 Standing: Heel raises x 10 4" step up and over x 5 5# sidestepping hip abduction with UE abduction x 10 each Toe taps cones x 3 cones x 5 reps each leg with 5# on legs 5# hip extensions/ step backs x 10 each Seated: 5# LAQ's x 10  06/19/22 5 x sit to stand 59.47 sec with bilateral UE assist  Seated: LAQ's 5# x 10  Standing: 5# on legs sidestepping with same side arm abduction x 10  each 5# marching x 5 each leg Squats x 10 5# hip extension x 10 4" box step up and overs x 5 with 1 UE assist Sidestepping 1 HHA x 4 // bars Heel toe walking with 2 UE assist down and back // bars  06/12/22 Heel/toe raises x 20 with 1 UE assist Side stepping with UE abduction x 10 each side with 3# weight Marching with 3# weights x 10 each with 1 UE assist; occasionally needs 2 Hip extension 3# x 10 each Cone taps x 2 x 5 each leg Squats x 10 with 1 UE assist 2" step up and overs x 5 over and back; CGA Tandem stance   Sitting: LAQ's 3# 1 x 10 with 2" hold   06/08/22 Heel/toe raises x 20 with 1 UE assist Side step with UE abduction same side 2x10 each with 1 UE assist LAQ 3# 1 x 10 with 10 second holds Hip extension 3# 2 x 10 each with 1 UE assist Tandem stance 3 x 30" with CGA/min A for safety STS with UE support and overhead reach 1X 10  06/06/2022 Step navigation with 1 UE assist on railing 4" and 8" steps  Standing: Heel/toe raises x 20 with 1 UE assist Side step with UE abduction same side x 12 each with 1 UE assist Hip extension 3# x 10 each with 1 UE assist Tandem stance 2 x 30" with CGA for safety; trial of no UE assist Marching 3# x 10 each Squats x 10  Sitting: 3# LAQs x 10 each   06/01/22 Progress note FOTO 80 5 x sit to stand  1 min and 16 sec using Ue's to assist 1st trial; 2nd trial 1 min and 2 sec 2nd trial  DGI 1. Gait level surface (2) Mild Impairment: Walks 20', uses assistive devices, slower speed, mild gait deviations. 2. Change in gait speed (1) Moderate Impairment: Makes only minor adjustments to walking speed, or accomplishes a change in speed with significant gait deviations, or changes speed but has significant gait deviations, or changes speed but loses balance but is able to recover and continue walking. 3. Gait with horizontal head turns (2) Mild Impairment: Performs head turns smoothly with slight change in gait velocity, i.e., minor  disruption to smooth gait path or uses walking aid. 4. Gait with vertical head turns (2) Mild Impairment: Performs head turns smoothly with slight change in gait velocity, i.e., minor disruption to smooth gait path or uses walking aid. 5. Gait and pivot turn (2) Mild Impairment: Pivot turns safely in > 3 seconds and stops with no loss of balance. 6. Step over obstacle (1) Moderate Impairment: Is able to step over box but must stop, then step over. May require verbal cueing. 7. Step around obstacles (2) Mild Impairment: Is able to step around both cones, but must slow down and adjust steps to clear cones. 8. Stairs (1) Moderate Impairment: Two feet to a stair, must use rail.  TOTAL SCORE: 13 /  24   05/30/22 3# weights on legs throughout treatment to improve proprioception Sitting: Heel/toe raises LAQ's x 10 each  Standing: Marching x 10 each Heel/toe raises x 10 Hip abduction with shoulder abduction big steps x 10 each holding 1 UE assist 2" step up and over x 5 using bilateral UE assist Standing no UE support holding yellow med ball trunk rotation x 10 each side SLS 3 x 10" each with 1 UE support Cone taps x 5 each foot with 1 UE support   05/25/22  Kept 3# weights on legs throughout to improve proprioception Heel raises x 10 with 1 UE assist Hip abduction with shoulder abduction combined x 10 each with 1 UE assist 3# marching with 2" hold x 10; 2 hand UE assist; trial of 1 UE assist with max difficulty Figure 8 walking with cones x 5 x 2 with CGA 2" step ups x 5 up and over Toe taps with 3# weights on legs; 3 cones x 5 each 2 UE assist  05/23/22 Step navigation with 2 UE assist, CGA; occassional 1 UE assist x 2  Standing: Heel/ toe raises x 20 with 1 UE assist 2# Marching 2" hold x 10 with 2 UE assist Figure 8 walking x 4 with CGA 2# hip abduction 2# hip extension 2# side stepping in // bars down and back x 3 2" step up and over with 2# weights on legs and 1 UE assist  x 5 Toe taps with 2# weights on legs; 2 cones x 5 each 2 UE assist    05/09/22 Review of HEP and goals Standing: //bars Heel toe raises with 1 UE assist 2 x 10 Mini squats x 10 with 1 UE assist Long steps in // bars x 4 reps with 1 UE assist Sidestepping in // bars x 6 reps with 1 UE assist 1 UE assist marching x 10 2 UE hip abduction x 10 4" step ups x 10 each Toe tap on cones x 2; 5 times each    05/04/2022 physical therapy evaluation and HEP instruction   PATIENT EDUCATION: Education details: Patient educated on exam findings, POC, scope of PT, HEP, and what to expect next treatment. Person educated: Patient Education method: Explanation, Demonstration, and Handouts Education comprehension: verbalized understanding, returned demonstration, verbal cues required, and tactile cues required  HOME EXERCISE PROGRAM: 06/06/22 hip extension and tandem stance 05/09/22 hip abduction, marching in standing  Access Code: [email or web address] URL: [email or web address] Date: 05/04/2022 Prepared by: [facility] - Rehab  Exercises - Heel Toe Raises with Counter Support  - 2 x daily - 7 x weekly - 1 sets - 10 reps - Mini Squat with Counter Support  - 2 x daily - 7 x weekly - 1 sets - 10 reps  GOALS: Goals reviewed with patient? No  SHORT TERM GOALS: Target date: 05/18/2022  patient will be independent with initial HEP   Baseline: Goal status: IN PROGRESS  2.  Patient will self report 30% improvement to improve tolerance for functional activity Baseline:  Goal status: IN PROGRESS   LONG TERM GOALS: Target date: 06/01/2022  Patient will be independent in self management strategies to improve quality of life and functional outcomes.   Baseline:  Goal status: IN PROGRESS  2.  Patient will self report 50% improvement to improve tolerance for functional activity Baseline:  Goal status: IN PROGRESS  3.  Patient will improve FOTO score to predicted value  (61)   Baseline: 54;  80 06/01/22 Goal status:  MET  4.  Patient will improve 5 times sit to stand score from 50.27 sec to 30 sec to demonstrate improved functional mobility and increased lower extremity strength.  Baseline: 50.27 sec; 3/21 1 min and 2 sec Goal status: IN PROGRESS  5.  Patient will improve DGI by 8 points (15) to demonstrate improved functional gait and balance with community ambulation Baseline: 7/24; 3/21 13 Goal status: IN PROGRESS   ASSESSMENT:  CLINICAL IMPRESSION: Today's session continued to focus on lower extremity strengthening and balance. Patient continues with difficulty coordinating movements and differentiating segmental movements.  Works hard in therapy; uses rollator with ambulation in PT gym today.   Patient will continue to benefit from physical therapy in order to improve function and reduce impairment.    Eval:Patient is a 32 y.o. male who was seen today for physical therapy evaluation and treatment for cerebellar ataxia. Patient presents to physical therapy with complaint of leg weakness and imbalance. Patient demonstrates decreased strength, balance deficits and gait abnormalities which are negatively impacting patient ability to perform ADLs and functional mobility tasks. Patient will benefit from skilled physical therapy services to address these deficits to improve level of function with ADLs, functional mobility tasks, and reduce risk for falls.    OBJECTIVE IMPAIRMENTS: Abnormal gait, decreased activity tolerance, decreased balance, decreased coordination, decreased endurance, decreased knowledge of use of DME, decreased mobility, difficulty walking, decreased ROM, decreased strength, decreased safety awareness, hypomobility, increased fascial restrictions, impaired perceived functional ability, and impaired flexibility.   ACTIVITY LIMITATIONS: carrying, lifting, bending, sitting, standing, squatting, stairs, transfers, bed mobility, bathing, toileting, dressing,  hygiene/grooming, locomotion level, and caring for others  PARTICIPATION LIMITATIONS: meal prep, cleaning, laundry, driving, shopping, community activity, occupation, and yard work  [facility] POTENTIAL: Good  CLINICAL DECISION MAKING: Evolving/moderate complexity  EVALUATION COMPLEXITY: Low  PLAN:  PT FREQUENCY: 2x/week  PT DURATION: 4 weeks  PLANNED INTERVENTIONS: Therapeutic exercises, Therapeutic activity, Neuromuscular re-education, Balance training, Gait training, Patient/Family education, Joint manipulation, Joint mobilization, Stair training, Orthotic/Fit training, DME instructions, Aquatic Therapy, Dry Needling, Electrical stimulation, Spinal manipulation, Spinal mobilization, Cryotherapy, Moist heat, Compression bandaging, scar mobilization, Splintting, Taping, Traction, Ultrasound, Ionotophoresis 4mg /ml Dexamethasone, and Manual therapy   PLAN FOR NEXT SESSION:  gait, balance; coordination and lower extremity strength; reassess next visit  11:14 AM, 06/22/22 Roberto Romanoski Small Leslie Jester MPT [place] [facility] [place] [phone] [phone]

## 2022-06-25 ENCOUNTER — Emergency Department (HOSPITAL_COMMUNITY)
Admission: EM | Admit: 2022-06-25 | Discharge: 2022-06-25 | Disposition: A | Discharge status: Home / Self Care | Payer: Commercial Managed Care - PPO | Attending: Emergency Medicine | Admitting: Emergency Medicine

## 2022-06-25 ENCOUNTER — Encounter (HOSPITAL_COMMUNITY): Payer: Self-pay | Admitting: *Deleted

## 2022-06-25 ENCOUNTER — Other Ambulatory Visit: Payer: Self-pay

## 2022-06-25 DIAGNOSIS — S0081XA Abrasion of other part of head, initial encounter: Secondary | ICD-10-CM | POA: Diagnosis not present

## 2022-06-25 DIAGNOSIS — E669 Obesity, unspecified: Secondary | ICD-10-CM | POA: Diagnosis not present

## 2022-06-25 DIAGNOSIS — S0990XA Unspecified injury of head, initial encounter: Secondary | ICD-10-CM | POA: Diagnosis not present

## 2022-06-25 DIAGNOSIS — S90812A Abrasion, left foot, initial encounter: Secondary | ICD-10-CM | POA: Diagnosis not present

## 2022-06-25 DIAGNOSIS — Y92410 Unspecified street and highway as the place of occurrence of the external cause: Secondary | ICD-10-CM | POA: Diagnosis not present

## 2022-06-25 DIAGNOSIS — Z6832 Body mass index (BMI) 32.0-32.9, adult: Secondary | ICD-10-CM | POA: Diagnosis not present

## 2022-06-25 DIAGNOSIS — R030 Elevated blood-pressure reading, without diagnosis of hypertension: Secondary | ICD-10-CM | POA: Diagnosis not present

## 2022-06-25 DIAGNOSIS — S0993XA Unspecified injury of face, initial encounter: Secondary | ICD-10-CM | POA: Diagnosis present

## 2022-06-25 HISTORY — DX: Depression, unspecified: F32.A

## 2022-06-25 HISTORY — DX: Other hereditary ataxias: G11.8

## 2022-06-25 NOTE — ED Notes (Signed)
Pt up amb D/C'd.
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
## 2022-06-26 DIAGNOSIS — E7849 Other hyperlipidemia: Secondary | ICD-10-CM | POA: Diagnosis not present

## 2022-06-26 DIAGNOSIS — G319 Degenerative disease of nervous system, unspecified: Secondary | ICD-10-CM | POA: Diagnosis not present

## 2022-06-26 DIAGNOSIS — Z6833 Body mass index (BMI) 33.0-33.9, adult: Secondary | ICD-10-CM | POA: Diagnosis not present

## 2022-06-26 DIAGNOSIS — E538 Deficiency of other specified B group vitamins: Secondary | ICD-10-CM | POA: Diagnosis not present

## 2022-06-26 DIAGNOSIS — F331 Major depressive disorder, recurrent, moderate: Secondary | ICD-10-CM | POA: Diagnosis not present

## 2022-08-08 ENCOUNTER — Other Ambulatory Visit (HOSPITAL_COMMUNITY): Payer: Self-pay

## 2022-08-08 MED ORDER — RISPERIDONE 2 MG PO TABS
2.0000 mg | ORAL_TABLET | Freq: Two times a day (BID) | ORAL | 1 refills | Status: AC
  Filled 2022-08-08: qty 180, 90d supply, fill #0

## 2022-08-09 ENCOUNTER — Other Ambulatory Visit (HOSPITAL_COMMUNITY): Payer: Self-pay

## 2022-08-10 ENCOUNTER — Other Ambulatory Visit (HOSPITAL_COMMUNITY): Payer: Self-pay

## 2022-08-28 DIAGNOSIS — R030 Elevated blood-pressure reading, without diagnosis of hypertension: Secondary | ICD-10-CM | POA: Diagnosis not present

## 2022-08-28 DIAGNOSIS — F331 Major depressive disorder, recurrent, moderate: Secondary | ICD-10-CM | POA: Diagnosis not present

## 2022-08-28 DIAGNOSIS — E7849 Other hyperlipidemia: Secondary | ICD-10-CM | POA: Diagnosis not present

## 2022-08-28 DIAGNOSIS — E538 Deficiency of other specified B group vitamins: Secondary | ICD-10-CM | POA: Diagnosis not present

## 2022-08-28 DIAGNOSIS — G319 Degenerative disease of nervous system, unspecified: Secondary | ICD-10-CM | POA: Diagnosis not present

## 2022-08-28 DIAGNOSIS — Z6833 Body mass index (BMI) 33.0-33.9, adult: Secondary | ICD-10-CM | POA: Diagnosis not present

## 2023-01-15 ENCOUNTER — Telehealth (HOSPITAL_COMMUNITY): Payer: Self-pay

## 2023-01-15 NOTE — Telephone Encounter (Signed)
Called to confirm 01/17/23 appt no answer left vm to confirm by 12:00 pm 01/16/23

## 2023-01-16 NOTE — Telephone Encounter (Signed)
Called to confirm tomorrow's appt no answer left vm pt confirmed appt via text 01/10/23

## 2023-01-17 ENCOUNTER — Encounter (HOSPITAL_COMMUNITY): Payer: Self-pay

## 2023-01-17 ENCOUNTER — Ambulatory Visit (INDEPENDENT_AMBULATORY_CARE_PROVIDER_SITE_OTHER): Payer: Commercial Managed Care - PPO | Admitting: Clinical

## 2023-01-17 DIAGNOSIS — F419 Anxiety disorder, unspecified: Secondary | ICD-10-CM | POA: Diagnosis not present

## 2023-01-17 DIAGNOSIS — F331 Major depressive disorder, recurrent, moderate: Secondary | ICD-10-CM
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
## 2023-01-29 DIAGNOSIS — G119 Hereditary ataxia, unspecified: Secondary | ICD-10-CM | POA: Diagnosis not present

## 2023-01-29 DIAGNOSIS — F39 Unspecified mood [affective] disorder: Secondary | ICD-10-CM | POA: Diagnosis not present

## 2023-01-31 ENCOUNTER — Other Ambulatory Visit (HOSPITAL_COMMUNITY): Payer: Self-pay

## 2023-01-31 DIAGNOSIS — Z6833 Body mass index (BMI) 33.0-33.9, adult: Secondary | ICD-10-CM | POA: Diagnosis not present

## 2023-01-31 DIAGNOSIS — Z23 Encounter for immunization: Secondary | ICD-10-CM | POA: Diagnosis not present

## 2023-01-31 DIAGNOSIS — G319 Degenerative disease of nervous system, unspecified: Secondary | ICD-10-CM | POA: Diagnosis not present

## 2023-01-31 DIAGNOSIS — E782 Mixed hyperlipidemia: Secondary | ICD-10-CM | POA: Diagnosis not present

## 2023-01-31 DIAGNOSIS — R030 Elevated blood-pressure reading, without diagnosis of hypertension: Secondary | ICD-10-CM | POA: Diagnosis not present

## 2023-01-31 DIAGNOSIS — F331 Major depressive disorder, recurrent, moderate: Secondary | ICD-10-CM | POA: Diagnosis not present

## 2023-01-31 DIAGNOSIS — E538 Deficiency of other specified B group vitamins: Secondary | ICD-10-CM | POA: Diagnosis not present

## 2023-01-31 MED ORDER — BUPROPION HCL ER (XL) 300 MG PO TB24
300.0000 mg | ORAL_TABLET | Freq: Every day | ORAL | 1 refills | Status: DC
  Filled 2023-01-31: qty 90, 90d supply, fill #0
  Filled 2023-04-29: qty 90, 90d supply, fill #1

## 2023-03-01 ENCOUNTER — Ambulatory Visit (HOSPITAL_COMMUNITY): Payer: Commercial Managed Care - PPO | Admitting: Clinical

## 2023-03-01 DIAGNOSIS — F419 Anxiety disorder, unspecified: Secondary | ICD-10-CM | POA: Diagnosis not present

## 2023-03-01 DIAGNOSIS — F331 Major depressive disorder, recurrent, moderate: Secondary | ICD-10-CM

## 2023-03-01 NOTE — Progress Notes (Signed)
IN PERSON   I connected with Samuel Stanley on 03/01/23 at  8:00 AM EST in person and verified that I am speaking with the correct person using two identifiers.  Location: Patient: office Provider: office   I discussed the limitations of evaluation and management by telemedicine and the availability of in person appointments. The patient expressed understanding and agreed to proceed. ( IN PERSON)   THERAPIST PROGRESS NOTE   Session Time: 8:00 AM-8:30 AM   Participation Level: Active   Behavioral Response: CasualAlertAnxious   Type of Therapy: Individual Therapy   Treatment Goals addressed: Coping Anger Management   Interventions: CBT, Motivational Interviewing, Solution Focused and Supportive   Summary: Samuel Stanley is a 32 y.o. male who presents with  Depression and anxiety. The OPT therapist worked with the patient for his scheduled session. The OPT therapist utilized Motivational Interviewing to assist in creating therapeutic repore. The patient spoke about doing well over the past few weeks and enjoying his Thanksgiving with his family and his wifes family. The patient spoke about his community interactions and family interactions over the course of the past few weeks. The patient noted things lately have been on the calmer side but he struggles when he is at home alone and his wife is at work with loneliness. 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 The patient continues to utlize his existing support network with individual effort to maintain as much independence as he can.The patient spoke about upcoming plans for Christmas. The OPT therapist over-viewed with the patient upcoming appointments as listed in his MyChart.   Suicidal/Homicidal: Nowithout intent/plan    Therapist Response: The OPT therapist worked with the patient for the patients scheduled session. The patient was engaged in his session and gave feedback in relation to triggers, symptoms, and behavior responses over the past few weeks. The OPT therapist worked with the patient utilizing an in session Cognitive Behavioral Therapy exercise. The patient was responsive in the session and verbalized," I enjoyed Thanksgiving  we spend time with my family and my wife [name]'s family". The OPT therapist worked with the patient overviewing his support symptoms and coping strategies. The patient spoke about his work to stay active in the home while his wife is at work helping with what he can in relation to in home cleaning, chores, and pet caregiving.  The patient spoke plan for the upcoming Christmas holiday and spending time with family. The OPT therapist worked with the patient overviewing appointments listed in the patients MyChart. The OPT therapist will continue treatment work with the patient in his next scheduled session.     Plan: Return again in 2/3 weeks.   Diagnosis:      Axis I: Recurrent moderate major depressive disorder with anxiety                            Axis II: No diagnosis     Collaboration of Care: No additional collaboration of care. .   Patient/Guardian was advised Release of Information must be obtained prior to any record release in order to collaborate their care with an outside provider. Patient/Guardian was advised if they have not already done so to contact the registration department to sign all necessary forms in order for [place] to release information regarding  their care.    Consent: Patient/Guardian gives verbal consent for treatment and assignment of benefits for services provided during this visit. Patient/Guardian expressed understanding and agreed to proceed.    I discussed the assessment and treatment plan with the patient. The patient was provided an opportunity to ask  questions and all were answered. The patient agreed with the plan and demonstrated an understanding of the instructions.   The patient was advised to call back or seek an in-person evaluation if the symptoms worsen or if the condition fails to improve as anticipated.   I provided 30 minutes of face-to-face time during this encounter.   Winfred Burn, LCSW    03/01/2023

## 2023-03-13 ENCOUNTER — Ambulatory Visit (HOSPITAL_COMMUNITY): Payer: Commercial Managed Care - PPO | Admitting: Clinical

## 2023-03-13 DIAGNOSIS — F419 Anxiety disorder, unspecified: Secondary | ICD-10-CM | POA: Diagnosis not present

## 2023-03-13 DIAGNOSIS — F331 Major depressive disorder, recurrent, moderate: Secondary | ICD-10-CM | POA: Diagnosis not present

## 2023-03-13 NOTE — Progress Notes (Signed)
 Virtual Visit via Video Note  I connected with Samuel Stanley on 03/13/23 at  1:00 PM EST by a video enabled telemedicine application and verified that I am speaking with the correct person using two identifiers.  Location: Patient: home Provider: office   I discussed the limitations of evaluation and management by telemedicine and the availability of in person appointments. The patient expressed understanding and agreed to proceed.    THERAPIST PROGRESS NOTE   Session Time: 1:00 PM-1:30 PM   Participation Level: Active   Behavioral Response: CasualAlertAnxious   Type of Therapy: Individual Therapy   Treatment Goals addressed: Coping Anger Management   Interventions: CBT, Motivational Interviewing, Solution Focused and Supportive   Summary: Samuel Stanley is a 32 y.o. male who presents with  Depression and anxiety. The OPT therapist worked with the patient for his scheduled session. The OPT therapist utilized Motivational Interviewing to assist in creating therapeutic repore. The patient spoke about doing well over the past few weeks and enjoying his Christmas with his family and his wifes family. The patient spoke about his community interactions and family interactions over the course of the past few weeks. The patient noted he feels he has been managing his emotions better primarily in the home with his wife and child.The OPT therapist utilized Cognitive Behavioral Therapy through cognitive restructuring as well as worked with the patient on coping strategies to assist in management of GAD. The patient continues to utlize his existing support network with individual effort to maintain as much independence as he can.The patient spoke about upcoming plans for New Years and 2025. The OPT therapist over-viewed with the patient upcoming appointments as listed in his MyChart.   Suicidal/Homicidal: Nowithout intent/plan   Therapist Response: The OPT therapist worked with the patient for the  patients scheduled session. The patient was engaged in his session and gave feedback in relation to triggers, symptoms, and behavior responses over the past few weeks. The OPT therapist worked with the patient utilizing an in session Cognitive Behavioral Therapy exercise. The patient was responsive in the session and verbalized, I enjoyed Christmas spend time with my family and my wife [name]'s family and we are just going to be staying at home and watching the ball drop on tv to bring in the New Year. The OPT therapist worked with the patient overviewing his support symptoms and coping strategies. The patient spoke about his work to stay active in the home while his wife is at work helping with what he can in relation to in home cleaning, chores, and pet caregiving.  The patient spoke plan for the upcoming new year. The OPT therapist worked with the patient overviewing appointments listed in the patients MyChart. The OPT therapist will continue treatment work with the patient in his next scheduled session.     Plan: Return again in 2/3 weeks.   Diagnosis:      Axis I: Recurrent moderate major depressive disorder with anxiety                            Axis II: No diagnosis     Collaboration of Care: No additional collaboration of care. .   Patient/Guardian was advised Release of Information must be obtained prior to any record release in order to collaborate their care with an outside provider. Patient/Guardian was advised if they have not already done so to contact the registration department to sign all necessary forms in order for us   to release information regarding their care.    Consent: Patient/Guardian gives verbal consent for treatment and assignment of benefits for services provided during this visit. Patient/Guardian expressed understanding and agreed to proceed.    I discussed the assessment and treatment plan with the patient. The patient was provided an opportunity to ask questions and  all were answered. The patient agreed with the plan and demonstrated an understanding of the instructions.   The patient was advised to call back or seek an in-person evaluation if the symptoms worsen or if the condition fails to improve as anticipated.   I provided 30 minutes of non-face-to-face time during this encounter.   Jerel ONEIDA Pepper, LCSW    03/13/2023

## 2023-03-22 NOTE — Therapy (Signed)
 OUTPATIENT PHYSICAL THERAPY NEURO EVALUATION   Patient Name: Samuel Stanley MRN: [id number] DOB:02/02/1991, 33 y.o., male Today's Date: 03/23/2023   PCP: Jerel Sieving, MD REFERRING PROVIDER: Merilee Rockey Boas, MD  END OF SESSION:  PT End of Session - 03/23/23 1309     Visit Number 1    Number of Visits 8    Date for PT Re-Evaluation 04/20/23    Authorization Type [employer] P[employer]    PT Start Time 0105    PT Stop Time 0145    PT Time Calculation (min) 40 min    Activity Tolerance Patient tolerated treatment well    Behavior During Therapy [facility] for tasks assessed/performed             Past Medical History:  Diagnosis Date   Depression    Hypertension    Spinocerebellar ataxia type 17 (HCC)    Past Surgical History:  Procedure Laterality Date   HERNIA REPAIR     testicular torsion     Patient Active Problem List   Diagnosis Date Noted   Anterior dislocation of right shoulder 03/01/2016    ONSET DATE: about 5 years ago  REFERRING DIAG: G11.9 (ICD-10-CM) - Cerebellar ataxia (HCC)  THERAPY DIAG:  Cerebellar ataxia (HCC)  Difficulty in walking, not elsewhere classified  Rationale for Evaluation and Treatment: Rehabilitation  SUBJECTIVE:                                                                                                                                                                                             SUBJECTIVE STATEMENT: Patient treated here earlier this year; continues with cerebellar ataxia; now using a rollator; reports no pain.  Issues with balance and leg weakness; cerebellar ataxia.  Has a 76 yo son ; married; symptoms started 5 years ago; gradually worsening; wife is a engineer, civil (consulting); has had some PT in [place] but is too far too go.  Grandmother drove him here today. Pt accompanied by: self   Pt accompanied by: family member  PERTINENT HISTORY:   PAIN:  Are you having pain? No  PRECAUTIONS: Fall  RED FLAGS: None   WEIGHT  BEARING RESTRICTIONS: No  FALLS: Has patient fallen in last 6 months? No  LIVING ENVIRONMENT: Lives with: lives with their family and lives with their spouse Lives in: House/apartment Stairs: Yes: External: 2 steps; none Has following equipment at home: Walker - 4 wheeled     PLOF: Independent  PATIENT GOALS: get stronger; get off rollator  OBJECTIVE:  Note: Objective measures were completed at Evaluation unless otherwise noted.  DIAGNOSTIC FINDINGS:   COGNITION: Overall cognitive status:  Within functional limits for tasks assessed   SENSATION: WFL  COORDINATION: Impaired finger to nose     POSTURE: rounded shoulders and forward head  LOWER EXTREMITY ROM:     Active  Right Eval Left Eval  Hip flexion    Hip extension    Hip abduction    Hip adduction    Hip internal rotation    Hip external rotation    Knee flexion    Knee extension    Ankle dorsiflexion    Ankle plantarflexion    Ankle inversion    Ankle eversion     (Blank rows = not tested)  LOWER EXTREMITY MMT:    MMT Right Eval Left Eval  Hip flexion 4- 4-  Hip extension    Hip abduction    Hip adduction    Hip internal rotation    Hip external rotation    Knee flexion    Knee extension    Ankle dorsiflexion    Ankle plantarflexion    Ankle inversion    Ankle eversion    (Blank rows = not tested)  BED MOBILITY:  Not tested at eval  TRANSFERS: Assistive device utilized: Environmental Consultant - 4 wheeled  Sit to stand: SBA Stand to sit: SBA Chair to chair:  not tested Floor:  not tested   STAIRS: Level of Assistance:  Optician, Dispensing:  with  Number of Stairs:   Height of Stairs:   Comments:   GAIT: Gait pattern: decreased step length- Right, decreased step length- Left, decreased hip/knee flexion- Right, decreased hip/knee flexion- Left, and shuffling Distance walked: 50 ft in clinic Assistive device utilized: Walker - 4 wheeled Level of assistance: SBA Comments:  occasionally impulsive, sometimes gets too fast for safe ambulation   FUNCTIONAL TESTS:  5 times sit to stand: 45.64 using hands and rollator to assist up to standing; legs push against back of chair 2 minute walk test: 112 ft with rollator SLS unable  PATIENT SURVEYS:  ABC scale 68.8%                                                                                                                              TREATMENT DATE: 03/23/23 physical therapy evaluation and HEP instruction    PATIENT EDUCATION: Education details: Patient educated on exam findings, POC, scope of PT, HEP, and what to expect next visit. Person educated: Patient Education method: Explanation, Demonstration, and Handouts Education comprehension: verbalized understanding, returned demonstration, verbal cues required, and tactile cues required   HOME EXERCISE PROGRAM: Access Code: [email or web address] URL: [email or web address] Date: 03/23/2023 Prepared by: [facility] - Rehab  Exercises - Sit to Stand with Counter Support  - 2 x daily - 7 x weekly - 1 sets - 10 reps - Standing March with Counter Support  - 2 x daily - 7 x weekly - 1 sets - 10 reps  GOALS: Goals reviewed with patient? No  SHORT TERM GOALS: Target date: 04/06/2023  patient  will be independent with initial HEP  Baseline: Goal status: INITIAL  2.  Patient will self report 25% improvement to improve tolerance for functional activity  Baseline:  Goal status: INITIAL  LONG TERM GOALS: Target date: 04/20/2023  Patient will be independent in self management strategies to improve quality of life and functional outcomes.  Baseline:  Goal status: INITIAL  2.  Patient will self report 50% improvement to improve tolerance for functional activity  Baseline:  Goal status: INITIAL  3.  Patient will be able to stand SLS x 4 each leg to demonstrate improved functional balance.   Baseline: unable Goal status: INITIAL  4.  Patient will increase  his 2 MWT distance to 220 ft to demonstrate improved functional mobility Baseline: 112 ft with rollator Goal status: INITIAL  5.  Patient will improve 5 times sit to stand score from 54.64 sec to  sec to demonstrate improved functional mobility and increased lower extremity strength.   Baseline:  Goal status: INITIAL  ASSESSMENT:  CLINICAL IMPRESSION: Patient is a 33 y.o. male who was seen today for physical therapy evaluation and treatment for cerebellar ataxia.  Patient demonstrates decreased strength, balance deficits and gait abnormalities which are negatively impacting patient ability to perform ADLs and functional mobility tasks. Patient will benefit from skilled physical therapy services to address these deficits to improve level of function with ADLs, functional mobility tasks, and reduce risk for falls.    OBJECTIVE IMPAIRMENTS: Abnormal gait, decreased activity tolerance, decreased balance, decreased coordination, decreased mobility, difficulty walking, decreased strength, decreased safety awareness, and impaired perceived functional ability.   ACTIVITY LIMITATIONS: carrying, lifting, bending, standing, squatting, stairs, transfers, bathing, toileting, dressing, locomotion level, and caring for others  PARTICIPATION LIMITATIONS: meal prep, cleaning, laundry, medication management, driving, shopping, community activity, occupation, and yard work  [employer] POTENTIAL: Good  CLINICAL DECISION MAKING: Evolving/moderate complexity  EVALUATION COMPLEXITY: Moderate  PLAN:  PT FREQUENCY: 2x/week  PT DURATION: 4 weeks  PLANNED INTERVENTIONS: 97164- PT Re-evaluation, 97110-Therapeutic exercises, 97530- Therapeutic activity, [id number]- Neuromuscular re-education, 97535- Self Care, [id number]- Manual therapy, [id number]- Gait training, [phone]- Orthotic Fit/training, [phone]- Canalith repositioning, [id number]- Aquatic Therapy, [phone]- Splinting, Patient/Family education, Balance training, Stair training, Taping,  Dry Needling, Joint mobilization, Joint manipulation, Spinal manipulation, Spinal mobilization, Scar mobilization, and DME instructions.   PLAN FOR NEXT SESSION: Review HEP and goals; perform dgi; balance and lower extremity strengthening, functional movement and strengthening   2:27 PM, 03/23/23 Christon Parada Small Ifeoluwa Beller MPT [facility] [place] [phone] Ph:[phone]

## 2023-03-23 ENCOUNTER — Ambulatory Visit (HOSPITAL_COMMUNITY): Payer: Medicare (Managed Care) | Attending: Neurology

## 2023-03-23 DIAGNOSIS — G119 Hereditary ataxia, unspecified: Secondary | ICD-10-CM | POA: Insufficient documentation

## 2023-03-23 DIAGNOSIS — R278 Other lack of coordination: Secondary | ICD-10-CM | POA: Insufficient documentation

## 2023-03-23 DIAGNOSIS — R262 Difficulty in walking, not elsewhere classified: Secondary | ICD-10-CM | POA: Insufficient documentation

## 2023-03-23 DIAGNOSIS — R270 Ataxia, unspecified: Secondary | ICD-10-CM | POA: Diagnosis present

## 2023-03-23 DIAGNOSIS — R2681 Unsteadiness on feet: Secondary | ICD-10-CM | POA: Insufficient documentation

## 2023-03-27 ENCOUNTER — Ambulatory Visit (HOSPITAL_COMMUNITY): Payer: Medicare (Managed Care) | Admitting: Clinical

## 2023-03-27 ENCOUNTER — Ambulatory Visit (HOSPITAL_COMMUNITY): Payer: Medicare (Managed Care)

## 2023-03-27 DIAGNOSIS — R2681 Unsteadiness on feet: Secondary | ICD-10-CM

## 2023-03-27 DIAGNOSIS — F331 Major depressive disorder, recurrent, moderate: Secondary | ICD-10-CM

## 2023-03-27 DIAGNOSIS — R278 Other lack of coordination: Secondary | ICD-10-CM

## 2023-03-27 DIAGNOSIS — R262 Difficulty in walking, not elsewhere classified: Secondary | ICD-10-CM

## 2023-03-27 DIAGNOSIS — R270 Ataxia, unspecified: Secondary | ICD-10-CM

## 2023-03-27 DIAGNOSIS — G119 Hereditary ataxia, unspecified: Secondary | ICD-10-CM | POA: Diagnosis not present

## 2023-03-27 DIAGNOSIS — F419 Anxiety disorder, unspecified: Secondary | ICD-10-CM

## 2023-03-27 NOTE — Progress Notes (Signed)
 Virtual Visit via Video Note   I connected with Samuel Stanley on 03/27/23 at  10:00 AM EST by a video enabled telemedicine application and verified that I am speaking with the correct person using two identifiers.   Location: Patient: home Provider: office   I discussed the limitations of evaluation and management by telemedicine and the availability of in person appointments. The patient expressed understanding and agreed to proceed.     THERAPIST PROGRESS NOTE   Session Time: 10:00 AM-10:45AM   Participation Level: Active   Behavioral Response: CasualAlertAnxious   Type of Therapy: Individual Therapy   Treatment Goals addressed: Coping Anger Management   Interventions: CBT, Motivational Interviewing, Solution Focused and Supportive   Summary: Samuel Stanley is a 33 y.o. male who presents with  Depression and anxiety. The OPT therapist worked with the patient for his scheduled session. The OPT therapist utilized Motivational Interviewing to assist in creating therapeutic repore. The patient spoke about doing well over the past few weeks and enjoying the beginning of the 2025 year and the snow event in the local area and spoke about his child being able to play in the snow. The patient noted he feels he has been managing his emotions better primarily in the home with his wife and child. The patient spoke about upcoming plans for the week and looking to spend time with his wife on her day off on Wednesday.The OPT therapist utilized Cognitive Behavioral Therapy through cognitive restructuring as well as worked with the patient on coping strategies to assist in management of GAD. The patient continues to utlize his existing support network with individual effort to maintain as much independence as he can.The patient continues to struggle with his physical health condition and will be working with his physical therapist later today . The patient does his PT MWF each week.The OPT therapist over-viewed  with the patient upcoming appointments as listed in his MyChart.   Suicidal/Homicidal: Nowithout intent/plan   Therapist Response: The OPT therapist worked with the patient for the patients scheduled session. The patient was engaged in his session and gave feedback in relation to triggers, symptoms, and behavior responses over the past few weeks into the first few weeks of January 2025. The OPT therapist worked with the patient utilizing an in session Cognitive Behavioral Therapy exercise. The patient was responsive in the session and verbalized, I have been doing good with working inside the house when [name] is gone to work and my child is at daycare, but today I have physical therapy and it usually takes about 2 an a half hours. The OPT therapist worked with the patient overviewing his support symptoms and coping strategies. The patient spoke about his work to stay active in the home while his wife is at work helping with what he can in relation to in home cleaning, chores, and pet caregiving.  The patient spoke about his worry around his wife's work schedule stress and her diet.  The OPT therapist overviewed the interaction between the patient and his wife and the patient indicated over the past few weeks interaction has been positive.The OPT therapist worked with the patient overviewing appointments listed in the patients MyChart. The OPT therapist will continue treatment work with the patient in his next scheduled session.     Plan: Return again in 2/3 weeks.   Diagnosis:      Axis I: Recurrent moderate major depressive disorder with anxiety  Axis II: No diagnosis     Collaboration of Care: No additional collaboration of care. .   Patient/Guardian was advised Release of Information must be obtained prior to any record release in order to collaborate their care with an outside provider. Patient/Guardian was advised if they have not already done so to contact the  registration department to sign all necessary forms in order for us  to release information regarding their care.    Consent: Patient/Guardian gives verbal consent for treatment and assignment of benefits for services provided during this visit. Patient/Guardian expressed understanding and agreed to proceed.    I discussed the assessment and treatment plan with the patient. The patient was provided an opportunity to ask questions and all were answered. The patient agreed with the plan and demonstrated an understanding of the instructions.   The patient was advised to call back or seek an in-person evaluation if the symptoms worsen or if the condition fails to improve as anticipated.   I provided 45 minutes of non-face-to-face time during this encounter.   Samuel ONEIDA Pepper, LCSW    03/27/2023

## 2023-03-27 NOTE — Therapy (Signed)
 OUTPATIENT PHYSICAL THERAPY NEURO TREATMENT   Patient Name: Samuel Stanley MRN: [id number] DOB:08/19/1990, 33 y.o., male Today's Date: 03/27/2023   PCP: Jerel Sieving, MD REFERRING PROVIDER: Merilee Rockey Boas, MD  END OF SESSION:  PT End of Session - 03/27/23 1123     Visit Number 2    Number of Visits 8    Date for PT Re-Evaluation 04/20/23    Authorization Type [employer] P[employer]    PT Start Time 1120   Late check in due to transportation   PT Stop Time 1145    PT Time Calculation (min) 25 min    Activity Tolerance Patient tolerated treatment well    Behavior During Therapy [facility] for tasks assessed/performed             Past Medical History:  Diagnosis Date   Depression    Hypertension    Spinocerebellar ataxia type 17 (HCC)    Past Surgical History:  Procedure Laterality Date   HERNIA REPAIR     testicular torsion     Patient Active Problem List   Diagnosis Date Noted   Anterior dislocation of right shoulder 03/01/2016    ONSET DATE: about 5 years ago  REFERRING DIAG: G11.9 (ICD-10-CM) - Cerebellar ataxia (HCC)  THERAPY DIAG:  Cerebellar ataxia (HCC)  Difficulty in walking, not elsewhere classified  Unsteadiness on feet  Other lack of coordination  Ataxia  Rationale for Evaluation and Treatment: Rehabilitation  SUBJECTIVE:                                                                                                                                                                                             SUBJECTIVE STATEMENT: Late arrival as he had to wait on transportation to bring him; no new falls  Eval:Patient treated here earlier this year; continues with cerebellar ataxia; now using a rollator; reports no pain.  Issues with balance and leg weakness; cerebellar ataxia.  Has a 31 yo son ; married; symptoms started 5 years ago; gradually worsening; wife is a engineer, civil (consulting); has had some PT in [place] but is too far too go.  Grandmother drove him  here today. Pt accompanied by: self   Pt accompanied by: family member  PERTINENT HISTORY:   PAIN:  Are you having pain? No  PRECAUTIONS: Fall  RED FLAGS: None   WEIGHT BEARING RESTRICTIONS: No  FALLS: Has patient fallen in last 6 months? No  LIVING ENVIRONMENT: Lives with: lives with their family and lives with their spouse Lives in: House/apartment Stairs: Yes: External: 2 steps; none Has following equipment at home: Vannie - 4 wheeled  PLOF: Independent  PATIENT GOALS: get stronger; get off rollator  OBJECTIVE:  Note: Objective measures were completed at Evaluation unless otherwise noted.  DIAGNOSTIC FINDINGS:   COGNITION: Overall cognitive status: Within functional limits for tasks assessed   SENSATION: WFL  COORDINATION: Impaired finger to nose     POSTURE: rounded shoulders and forward head  LOWER EXTREMITY ROM:     Active  Right Eval Left Eval  Hip flexion    Hip extension    Hip abduction    Hip adduction    Hip internal rotation    Hip external rotation    Knee flexion    Knee extension    Ankle dorsiflexion    Ankle plantarflexion    Ankle inversion    Ankle eversion     (Blank rows = not tested)  LOWER EXTREMITY MMT:    MMT Right Eval Left Eval  Hip flexion 4- 4-  Hip extension    Hip abduction    Hip adduction    Hip internal rotation    Hip external rotation    Knee flexion    Knee extension    Ankle dorsiflexion    Ankle plantarflexion    Ankle inversion    Ankle eversion    (Blank rows = not tested)  BED MOBILITY:  Not tested at eval  TRANSFERS: Assistive device utilized: Environmental Consultant - 4 wheeled  Sit to stand: SBA Stand to sit: SBA Chair to chair:  not tested Floor:  not tested   STAIRS: Level of Assistance:  Optician, Dispensing:  with  Number of Stairs:   Height of Stairs:   Comments:   GAIT: Gait pattern: decreased step length- Right, decreased step length- Left, decreased hip/knee  flexion- Right, decreased hip/knee flexion- Left, and shuffling Distance walked: 50 ft in clinic Assistive device utilized: Walker - 4 wheeled Level of assistance: SBA Comments: occasionally impulsive, sometimes gets too fast for safe ambulation   FUNCTIONAL TESTS:  5 times sit to stand: 45.64 using hands and rollator to assist up to standing; legs push against back of chair 2 minute walk test: 112 ft with rollator SLS unable  PATIENT SURVEYS:  ABC scale 68.8%                                                                                                                              TREATMENT DATE:  03/27/23 Standing at // bars: Marching x 10 Heel toe walking down and back x 2 Stagger stance no UE assist 2 x 10 Sidestepping down and back x 2 Stepping over yoga blocks down and back x 2  Walking around yoga blocks with rollator    03/23/23 physical therapy evaluation and HEP instruction    PATIENT EDUCATION: Education details: Patient educated on exam findings, POC, scope of PT, HEP, and what to expect next visit. Person educated: Patient Education method: Explanation, Demonstration, and Handouts Education comprehension: verbalized understanding, returned demonstration, verbal cues required,  and tactile cues required   HOME EXERCISE PROGRAM: Access Code: [email or web address] URL: [email or web address] Date: 03/23/2023 Prepared by: [facility] - Rehab  Exercises - Sit to Stand with Counter Support  - 2 x daily - 7 x weekly - 1 sets - 10 reps - Standing March with Counter Support  - 2 x daily - 7 x weekly - 1 sets - 10 reps  GOALS: Goals reviewed with patient? No  SHORT TERM GOALS: Target date: 04/06/2023  patient will be independent with initial HEP  Baseline: Goal status: INITIAL  2.  Patient will self report 25% improvement to improve tolerance for functional activity  Baseline:  Goal status: INITIAL  LONG TERM GOALS: Target date: 04/20/2023  Patient will be  independent in self management strategies to improve quality of life and functional outcomes.  Baseline:  Goal status: INITIAL  2.  Patient will self report 50% improvement to improve tolerance for functional activity  Baseline:  Goal status: INITIAL  3.  Patient will be able to stand SLS x 4 each leg to demonstrate improved functional balance.   Baseline: unable Goal status: INITIAL  4.  Patient will increase his 2 MWT distance to 220 ft to demonstrate improved functional mobility Baseline: 112 ft with rollator Goal status: INITIAL  5.  Patient will improve 5 times sit to stand score from 54.64 sec to  sec to demonstrate improved functional mobility and increased lower extremity strength.   Baseline:  Goal status: INITIAL  ASSESSMENT:  CLINICAL IMPRESSION: Today's session started late due to late arrival; patient depends on others for transportation.  Worked on balance and walking activity today. Did not do dgi today due to limited time.  Has a hard time not stepping on his own feet with tandem walking.  Patient will benefit from continued skilled therapy services to address deficits and promote return to optimal function.       Eval:Patient is a 33 y.o. male who was seen today for physical therapy evaluation and treatment for cerebellar ataxia.  Patient demonstrates decreased strength, balance deficits and gait abnormalities which are negatively impacting patient ability to perform ADLs and functional mobility tasks. Patient will benefit from skilled physical therapy services to address these deficits to improve level of function with ADLs, functional mobility tasks, and reduce risk for falls.    OBJECTIVE IMPAIRMENTS: Abnormal gait, decreased activity tolerance, decreased balance, decreased coordination, decreased mobility, difficulty walking, decreased strength, decreased safety awareness, and impaired perceived functional ability.   ACTIVITY LIMITATIONS: carrying, lifting,  bending, standing, squatting, stairs, transfers, bathing, toileting, dressing, locomotion level, and caring for others  PARTICIPATION LIMITATIONS: meal prep, cleaning, laundry, medication management, driving, shopping, community activity, occupation, and yard work  [employer] POTENTIAL: Good  CLINICAL DECISION MAKING: Evolving/moderate complexity  EVALUATION COMPLEXITY: Moderate  PLAN:  PT FREQUENCY: 2x/week  PT DURATION: 4 weeks  PLANNED INTERVENTIONS: 97164- PT Re-evaluation, 97110-Therapeutic exercises, 97530- Therapeutic activity, [id number]- Neuromuscular re-education, 97535- Self Care, 02859- Manual therapy, [id number]- Gait training, [phone]- Orthotic Fit/training, [phone]- Canalith repositioning, [id number]- Aquatic Therapy, [id number]- Splinting, Patient/Family education, Balance training, Stair training, Taping, Dry Needling, Joint mobilization, Joint manipulation, Spinal manipulation, Spinal mobilization, Scar mobilization, and DME instructions.   PLAN FOR NEXT SESSION: Review HEP and goals; perform dgi; balance and lower extremity strengthening, functional movement and strengthening   11:56 AM, 03/27/23 Yvette Roark Small Kelby Lotspeich MPT [facility] [place] [phone] Ph:[phone]

## 2023-03-29 ENCOUNTER — Encounter (HOSPITAL_COMMUNITY): Payer: Self-pay

## 2023-03-29 ENCOUNTER — Ambulatory Visit (HOSPITAL_COMMUNITY): Payer: Commercial Managed Care - PPO

## 2023-03-29 DIAGNOSIS — G119 Hereditary ataxia, unspecified: Secondary | ICD-10-CM | POA: Diagnosis not present

## 2023-03-29 DIAGNOSIS — R262 Difficulty in walking, not elsewhere classified: Secondary | ICD-10-CM

## 2023-03-29 NOTE — Therapy (Signed)
OUTPATIENT PHYSICAL THERAPY NEURO TREATMENT   Patient Name: Samuel Stanley MRN: [id number] DOB:07/26/1990, 33 y.o., male Today's Date: 03/29/2023   PCP: Donzetta Sprung, MD REFERRING PROVIDER: [name], MD  END OF SESSION:  PT End of Session - 03/29/23 1109     Visit Number 3    Number of Visits 8    Date for PT Re-Evaluation 04/20/23    Authorization Type [facility] [employer]    PT Start Time 1109    PT Stop Time 1147    PT Time Calculation (min) 38 min    Behavior During Therapy [facility] for tasks assessed/performed              Past Medical History:  Diagnosis Date   Depression    Hypertension    Spinocerebellar ataxia type 17 (HCC)    Past Surgical History:  Procedure Laterality Date   HERNIA REPAIR     testicular torsion     Patient Active Problem List   Diagnosis Date Noted   Anterior dislocation of right shoulder 03/01/2016    ONSET DATE: about 5 years ago  REFERRING DIAG: G11.9 (ICD-10-CM) - Cerebellar ataxia (HCC)  THERAPY DIAG:  Cerebellar ataxia (HCC)  Difficulty in walking, not elsewhere classified  Rationale for Evaluation and Treatment: Rehabilitation  SUBJECTIVE:                                                                                                                                                                                             SUBJECTIVE STATEMENT: Arrived with walker, no reports of pain or recent falls.    Eval:Patient treated here earlier this year; continues with cerebellar ataxia; now using a rollator; reports no pain.  Issues with balance and leg weakness; cerebellar ataxia.  Has a 75 yo son ; married; symptoms started 5 years ago; gradually worsening; wife is a Engineer, civil (consulting); has had some PT in [place] but is too far too go.  Grandmother drove him here today. Pt accompanied by: self   Pt accompanied by: family member  PERTINENT HISTORY:   PAIN:  Are you having pain? No  PRECAUTIONS: Fall  RED  FLAGS: None   WEIGHT BEARING RESTRICTIONS: No  FALLS: Has patient fallen in last 6 months? No  LIVING ENVIRONMENT: Lives with: lives with their family and lives with their spouse Lives in: House/apartment Stairs: Yes: External: 2 steps; none Has following equipment at home: Walker - 4 wheeled     PLOF: Independent  PATIENT GOALS: get stronger; get off rollator  OBJECTIVE:  Note: Objective measures were completed at Evaluation unless otherwise noted.  DIAGNOSTIC FINDINGS:  COGNITION: Overall cognitive status: Within functional limits for tasks assessed   SENSATION: WFL  COORDINATION: Impaired finger to nose     POSTURE: rounded shoulders and forward head  LOWER EXTREMITY ROM:     Active  Right Eval Left Eval  Hip flexion    Hip extension    Hip abduction    Hip adduction    Hip internal rotation    Hip external rotation    Knee flexion    Knee extension    Ankle dorsiflexion    Ankle plantarflexion    Ankle inversion    Ankle eversion     (Blank rows = not tested)  LOWER EXTREMITY MMT:    MMT Right Eval Left Eval  Hip flexion 4- 4-  Hip extension    Hip abduction    Hip adduction    Hip internal rotation    Hip external rotation    Knee flexion    Knee extension    Ankle dorsiflexion    Ankle plantarflexion    Ankle inversion    Ankle eversion    (Blank rows = not tested)  BED MOBILITY:  Not tested at eval  TRANSFERS: Assistive device utilized: Environmental consultant - 4 wheeled  Sit to stand: SBA Stand to sit: SBA Chair to chair:  not tested Floor:  not tested   STAIRS: Level of Assistance:  Psychologist, counselling Technique:  with  Number of Stairs:   Height of Stairs:   Comments:   GAIT: Gait pattern: decreased step length- Right, decreased step length- Left, decreased hip/knee flexion- Right, decreased hip/knee flexion- Left, and shuffling Distance walked: 50 ft in clinic Assistive device utilized: Walker - 4 wheeled Level of assistance:  SBA Comments: occasionally impulsive, sometimes gets too fast for safe ambulation   FUNCTIONAL TESTS:  5 times sit to stand: 45.64 using hands and rollator to assist up to standing; legs push against back of chair 2 minute walk test: 112 ft with rollator SLS unable  PATIENT SURVEYS:  ABC scale 68.8%                                                                                                                              TREATMENT DATE:  03/29/23: DGI 1. Gait level surface (1) Moderate Impairment: Walks 20', slow speed, abnormal gait pattern, evidence for imbalance. 2. Change in gait speed (1) Moderate Impairment: Makes only minor adjustments to walking speed, or accomplishes a change in speed with significant gait deviations, or changes speed but has significant gait deviations, or changes speed but loses balance but is able to recover and continue walking. 3. Gait with horizontal head turns (0) Severe Impairment: Performs task with severe disruption of gait, i.e., staggers outside 15" path, loses balance, stops, reaches for wall. 4. Gait with vertical head turns 1) Moderate Impairment: Performs head turns with moderate change in gait velocity, slows down, staggers but recovers, can continue to walk. 5. Gait and pivot turn (  1) Moderate Impairment: Turns slowly, requires verbal cueing, requires several small steps to catch balance following turn and stop. 6. Step over obstacle (0) Severe Impairment: Cannot perform without assistance. 7. Step around obstacles (1) Moderate Impairment: Is able to clear cones but must significantly slow, speed to accomplish task, or requires verbal cueing. 8. Stairs (1) Moderate Impairment: Two feet to a stair, must use rail.  TOTAL SCORE: 6 / 24  STS 10x  Marching with HHA Sidestep front of mat 2RT with walker  03/27/23 Standing at // bars: Marching x 10 Heel toe walking down and back x 2 Stagger stance no UE assist 2 x 10" Sidestepping down and  back x 2 Stepping over yoga blocks down and back x 2  Walking around yoga blocks with rollator    03/23/23 physical therapy evaluation and HEP instruction    PATIENT EDUCATION: Education details: Patient educated on exam findings, POC, scope of PT, HEP, and what to expect next visit. Person educated: Patient Education method: Explanation, Demonstration, and Handouts Education comprehension: verbalized understanding, returned demonstration, verbal cues required, and tactile cues required   HOME EXERCISE PROGRAM: Access Code: [email or web address] URL: [email or web address] Date: 03/23/2023 Prepared by: [facility] - Rehab  Exercises - Sit to Stand with Counter Support  - 2 x daily - 7 x weekly - 1 sets - 10 reps - Standing March with Counter Support  - 2 x daily - 7 x weekly - 1 sets - 10 reps  GOALS: Goals reviewed with patient? No  SHORT TERM GOALS: Target date: 04/06/2023  patient will be independent with initial HEP  Baseline: Goal status: INITIAL  2.  Patient will self report 25% improvement to improve tolerance for functional activity  Baseline:  Goal status: INITIAL  LONG TERM GOALS: Target date: 04/20/2023  Patient will be independent in self management strategies to improve quality of life and functional outcomes.  Baseline:  Goal status: INITIAL  2.  Patient will self report 50% improvement to improve tolerance for functional activity  Baseline:  Goal status: INITIAL  3.  Patient will be able to stand SLS x 4" each leg to demonstrate improved functional balance.   Baseline: unable Goal status: INITIAL  4.  Patient will increase his 2 MWT distance to 220 ft to demonstrate improved functional mobility Baseline: 112 ft with rollator Goal status: INITIAL  5.  Patient will improve 5 times sit to stand score from 54.64 sec to  sec to demonstrate improved functional mobility and increased lower extremity strength.   Baseline:  Goal status:  INITIAL  ASSESSMENT:  CLINICAL IMPRESSION: Began session with DGI with score 6/24 indicating fall risk with impaired gait mechanics and min A require.  Encouraged pt to stand within walker as tendency to push walker too far ahead.  Balance and strengthening activities complete.  Pt with tendency to stand WBOS, increased difficulty with NBOS activities.      Eval:Patient is a 33 y.o. male who was seen today for physical therapy evaluation and treatment for cerebellar ataxia.  Patient demonstrates decreased strength, balance deficits and gait abnormalities which are negatively impacting patient ability to perform ADLs and functional mobility tasks. Patient will benefit from skilled physical therapy services to address these deficits to improve level of function with ADLs, functional mobility tasks, and reduce risk for falls.    OBJECTIVE IMPAIRMENTS: Abnormal gait, decreased activity tolerance, decreased balance, decreased coordination, decreased mobility, difficulty walking, decreased strength, decreased safety awareness, and impaired perceived functional ability.   ACTIVITY  LIMITATIONS: carrying, lifting, bending, standing, squatting, stairs, transfers, bathing, toileting, dressing, locomotion level, and caring for others  PARTICIPATION LIMITATIONS: meal prep, cleaning, laundry, medication management, driving, shopping, community activity, occupation, and yard work  [facility] POTENTIAL: Good  CLINICAL DECISION MAKING: Evolving/moderate complexity  EVALUATION COMPLEXITY: Moderate  PLAN:  PT FREQUENCY: 2x/week  PT DURATION: 4 weeks  PLANNED INTERVENTIONS: 97164- PT Re-evaluation, 97110-Therapeutic exercises, 97530- Therapeutic activity, [id number]- Neuromuscular re-education, 97535- Self Care, 96045- Manual therapy, [id number]- Gait training, [id number]- Orthotic Fit/training, [phone]- Canalith repositioning, [id number]- Aquatic Therapy, [phone]- Splinting, Patient/Family education, Balance training, Stair training,  Taping, Dry Needling, Joint mobilization, Joint manipulation, Spinal manipulation, Spinal mobilization, Scar mobilization, and DME instructions.   PLAN FOR NEXT SESSION: Review HEP and goals; balance and lower extremity strengthening, functional movement and strengthening   Juel Burrow, PTA 03/29/2023, 3:06 PM  2:59 PM, 03/29/23

## 2023-04-03 ENCOUNTER — Ambulatory Visit (HOSPITAL_COMMUNITY): Payer: Medicare (Managed Care) | Admitting: Clinical

## 2023-04-03 DIAGNOSIS — F331 Major depressive disorder, recurrent, moderate: Secondary | ICD-10-CM | POA: Diagnosis not present

## 2023-04-03 DIAGNOSIS — F419 Anxiety disorder, unspecified: Secondary | ICD-10-CM

## 2023-04-03 NOTE — Progress Notes (Signed)
Virtual Visit via Video Note   I connected with Samuel Stanley on 04/03/23 at  10:00 AM EST by a video enabled telemedicine application and verified that I am speaking with the correct person using two identifiers.   Location: Patient: home Provider: office   I discussed the limitations of evaluation and management by telemedicine and the availability of in person appointments. The patient expressed understanding and agreed to proceed.     THERAPIST PROGRESS NOTE   Session Time: 10:00 AM-10:55 AM   Participation Level: Active   Behavioral Response: CasualAlertAnxious   Type of Therapy: Individual Therapy   Treatment Goals addressed: Coping Anger Management   Interventions: CBT, Motivational Interviewing, Solution Focused and Supportive   Summary: Samuel Stanley is a 33 y.o. male who presents with  Depression and anxiety. The OPT therapist worked with the patient for his scheduled session. The OPT therapist utilized Motivational Interviewing to assist in creating therapeutic repore. The patient spoke about having difficulty with his wife getting off at 3:30 pm but not getting home until 6:30 PM. The patient verbalized understanding that the wife once off has to go get their child from her parents home and drive home, however, noted he would ideally like for her to get home sooner in the afternoon as he feels he needs to have the extra support and is struggling with being at home as much during the day with his wife gone to work during the day and his child at day care and he being home due to his limitations from his health condition alone.The OPT therapist utilized Cognitive Behavioral Therapy through cognitive restructuring as well as worked with the patient on coping strategies to assist in management of GAD. The patient continues to utlize his existing support network with individual effort to maintain as much independence as he can.The patient continues to struggle with his physical health  condition and will be working with his physical therapist .  The OPT therapist worked with the patient encouraging positive communication with his partner from a non judgmental way.The patient does his PT MWF each week.The OPT therapist over-viewed with the patient upcoming appointments as listed in his MyChart.   Suicidal/Homicidal: Nowithout intent/plan   Therapist Response: The OPT therapist worked with the patient for the patients scheduled session. The patient was engaged in his session and gave feedback in relation to triggers, symptoms, and behavior responses over the past few weeks through mid January 2025. The OPT therapist worked with the patient utilizing an in session Cognitive Behavioral Therapy exercise. The patient was responsive in the session and verbalized," I would like  [name] to get home sooner in the afternoon off work and I try to help when she does come home to not yell at the dog and to help with our child".The OPT therapist worked with the patient overviewing his support symptoms and coping strategies. The patient spoke about his work to stay active in the home while his wife is at work helping with what he can in relation to in home cleaning, chores, and pet caregiving.  The patient spoke about his worry around his wife's work schedule stress.  The OPT therapist overviewed the interaction between the patient and his wife and the patient indicated over the past few weeks interaction has been positive. The patient spoke about his ongoing work and involvement with PT for his physical health condition. The patient noted his wife did decide to take some time off from work  around her birthday March  10th and he will be going with his wife and child on a vacation around that time possibly to [facility]. The OPT therapist worked with the patient overviewing appointments listed in the patients MyChart. The OPT therapist will continue treatment work with the patient in his next scheduled  session.     Plan: Return again in 2/3 weeks.   Diagnosis:      Axis I: Recurrent moderate major depressive disorder with anxiety                            Axis II: No diagnosis     Collaboration of Care: No additional collaboration of care. .   Patient/Guardian was advised Release of Information must be obtained prior to any record release in order to collaborate their care with an outside provider. Patient/Guardian was advised if they have not already done so to contact the registration department to sign all necessary forms in order for [place] to release information regarding their care.    Consent: Patient/Guardian gives verbal consent for treatment and assignment of benefits for services provided during this visit. Patient/Guardian expressed understanding and agreed to proceed.    I discussed the assessment and treatment plan with the patient. The patient was provided an opportunity to ask questions and all were answered. The patient agreed with the plan and demonstrated an understanding of the instructions.   The patient was advised to call back or seek an in-person evaluation if the symptoms worsen or if the condition fails to improve as anticipated.   I provided 55 minutes of non-face-to-face time during this encounter.   Winfred Burn, LCSW   04/03/2023

## 2023-04-05 ENCOUNTER — Ambulatory Visit (HOSPITAL_COMMUNITY): Payer: Medicare (Managed Care)

## 2023-04-05 DIAGNOSIS — G119 Hereditary ataxia, unspecified: Secondary | ICD-10-CM

## 2023-04-05 DIAGNOSIS — R262 Difficulty in walking, not elsewhere classified: Secondary | ICD-10-CM

## 2023-04-05 DIAGNOSIS — R2681 Unsteadiness on feet: Secondary | ICD-10-CM

## 2023-04-05 DIAGNOSIS — R278 Other lack of coordination: Secondary | ICD-10-CM

## 2023-04-05 NOTE — Therapy (Signed)
OUTPATIENT PHYSICAL THERAPY NEURO TREATMENT   Patient Name: Samuel Stanley MRN: [id number] DOB:[birth year], 33 y.o., male Today's Date: 04/05/2023   PCP: Donzetta Sprung, MD REFERRING PROVIDER: [name], MD  END OF SESSION:  PT End of Session - 04/05/23 1111     Visit Number 4    Number of Visits 8    Date for PT Re-Evaluation 04/20/23    Authorization Type [place] [employer] PPO    PT Start Time 1106    PT Stop Time 1145    PT Time Calculation (min) 39 min    Activity Tolerance Patient tolerated treatment well    Behavior During Therapy [facility] for tasks assessed/performed              Past Medical History:  Diagnosis Date   Depression    Hypertension    Spinocerebellar ataxia type 17 (HCC)    Past Surgical History:  Procedure Laterality Date   HERNIA REPAIR     testicular torsion     Patient Active Problem List   Diagnosis Date Noted   Anterior dislocation of right shoulder 03/01/2016    ONSET DATE: about 5 years ago  REFERRING DIAG: G11.9 (ICD-10-CM) - Cerebellar ataxia (HCC)  THERAPY DIAG:  Cerebellar ataxia (HCC)  Difficulty in walking, not elsewhere classified  Unsteadiness on feet  Other lack of coordination  Rationale for Evaluation and Treatment: Rehabilitation  SUBJECTIVE:                                                                                                                                                                                             SUBJECTIVE STATEMENT: Arrived with new 3 wheeled walker; reports it is easier to navigate  Eval:Patient treated here earlier this year; continues with cerebellar ataxia; now using a rollator; reports no pain.  Issues with balance and leg weakness; cerebellar ataxia.  Has a 68 yo son ; married; symptoms started 5 years ago; gradually worsening; wife is a Engineer, civil (consulting); has had some PT in [place] but is too far too go.  Grandmother drove him here today. Pt accompanied by: self   Pt accompanied  by: family member  PERTINENT HISTORY:   PAIN:  Are you having pain? No  PRECAUTIONS: Fall  RED FLAGS: None   WEIGHT BEARING RESTRICTIONS: No  FALLS: Has patient fallen in last 6 months? No  LIVING ENVIRONMENT: Lives with: lives with their family and lives with their spouse Lives in: House/apartment Stairs: Yes: External: 2 steps; none Has following equipment at home: Walker - 4 wheeled     PLOF: Independent  PATIENT GOALS: get stronger; get off  rollator  OBJECTIVE:  Note: Objective measures were completed at Evaluation unless otherwise noted.  DIAGNOSTIC FINDINGS:   COGNITION: Overall cognitive status: Within functional limits for tasks assessed   SENSATION: WFL  COORDINATION: Impaired finger to nose     POSTURE: rounded shoulders and forward head  LOWER EXTREMITY ROM:     Active  Right Eval Left Eval  Hip flexion    Hip extension    Hip abduction    Hip adduction    Hip internal rotation    Hip external rotation    Knee flexion    Knee extension    Ankle dorsiflexion    Ankle plantarflexion    Ankle inversion    Ankle eversion     (Blank rows = not tested)  LOWER EXTREMITY MMT:    MMT Right Eval Left Eval  Hip flexion 4- 4-  Hip extension    Hip abduction    Hip adduction    Hip internal rotation    Hip external rotation    Knee flexion    Knee extension    Ankle dorsiflexion    Ankle plantarflexion    Ankle inversion    Ankle eversion    (Blank rows = not tested)  BED MOBILITY:  Not tested at eval  TRANSFERS: Assistive device utilized: Environmental consultant - 4 wheeled  Sit to stand: SBA Stand to sit: SBA Chair to chair:  not tested Floor:  not tested   STAIRS: Level of Assistance:  Psychologist, counselling Technique:  with  Number of Stairs:   Height of Stairs:   Comments:   GAIT: Gait pattern: decreased step length- Right, decreased step length- Left, decreased hip/knee flexion- Right, decreased hip/knee flexion- Left, and  shuffling Distance walked: 50 ft in clinic Assistive device utilized: Walker - 4 wheeled Level of assistance: SBA Comments: occasionally impulsive, sometimes gets too fast for safe ambulation   FUNCTIONAL TESTS:  5 times sit to stand: 45.64 using hands and rollator to assist up to standing; legs push against back of chair 2 minute walk test: 112 ft with rollator SLS unable  PATIENT SURVEYS:  ABC scale 68.8%                                                                                                                              TREATMENT DATE:  04/05/23 Standing in // bars Heel raises 2 x 10 Toe raises 2 x 10 Marching 2 x 10 Squats 2 x 10 Hip abduction 2 x 10 Hip extension 2 x 10 Toe taps on cone x 3 cones x 5 reps each Tandem stance x 10" x 2 each Standing small base of support 2 x 10" each  Sitting on edge of chair 3# bar reach overhead 2 x 10 Seated trunk rotation with yellow med ball x 5 each  Sit to stand holding yellow med ball x 5 with 1 UE assist    03/29/23: DGI 1. Gait level surface (  1) Moderate Impairment: Walks 20', slow speed, abnormal gait pattern, evidence for imbalance. 2. Change in gait speed (1) Moderate Impairment: Makes only minor adjustments to walking speed, or accomplishes a change in speed with significant gait deviations, or changes speed but has significant gait deviations, or changes speed but loses balance but is able to recover and continue walking. 3. Gait with horizontal head turns (0) Severe Impairment: Performs task with severe disruption of gait, i.e., staggers outside 15" path, loses balance, stops, reaches for wall. 4. Gait with vertical head turns 1) Moderate Impairment: Performs head turns with moderate change in gait velocity, slows down, staggers but recovers, can continue to walk. 5. Gait and pivot turn (1) Moderate Impairment: Turns slowly, requires verbal cueing, requires several small steps to catch balance following turn and  stop. 6. Step over obstacle (0) Severe Impairment: Cannot perform without assistance. 7. Step around obstacles (1) Moderate Impairment: Is able to clear cones but must significantly slow, speed to accomplish task, or requires verbal cueing. 8. Stairs (1) Moderate Impairment: Two feet to a stair, must use rail.  TOTAL SCORE: 6 / 24  STS 10x  Marching with HHA Sidestep front of mat 2RT with walker  03/27/23 Standing at // bars: Marching x 10 Heel toe walking down and back x 2 Stagger stance no UE assist 2 x 10" Sidestepping down and back x 2 Stepping over yoga blocks down and back x 2  Walking around yoga blocks with rollator    03/23/23 physical therapy evaluation and HEP instruction    PATIENT EDUCATION: Education details: Patient educated on exam findings, POC, scope of PT, HEP, and what to expect next visit. Person educated: Patient Education method: Explanation, Demonstration, and Handouts Education comprehension: verbalized understanding, returned demonstration, verbal cues required, and tactile cues required   HOME EXERCISE PROGRAM: Access Code: [email or web address] URL: [email or web address] Date: 03/23/2023 Prepared by: [facility] - Rehab  Exercises - Sit to Stand with Counter Support  - 2 x daily - 7 x weekly - 1 sets - 10 reps - Standing March with Counter Support  - 2 x daily - 7 x weekly - 1 sets - 10 reps  GOALS: Goals reviewed with patient? No  SHORT TERM GOALS: Target date: 04/06/2023  patient will be independent with initial HEP  Baseline: Goal status: INITIAL  2.  Patient will self report 25% improvement to improve tolerance for functional activity  Baseline:  Goal status: INITIAL  LONG TERM GOALS: Target date: 04/20/2023  Patient will be independent in self management strategies to improve quality of life and functional outcomes.  Baseline:  Goal status: INITIAL  2.  Patient will self report 50% improvement to improve tolerance for  functional activity  Baseline:  Goal status: INITIAL  3.  Patient will be able to stand SLS x 4" each leg to demonstrate improved functional balance.   Baseline: unable Goal status: INITIAL  4.  Patient will increase his 2 MWT distance to 220 ft to demonstrate improved functional mobility Baseline: 112 ft with rollator Goal status: INITIAL  5.  Patient will improve 5 times sit to stand score from 54.64 sec to  sec to demonstrate improved functional mobility and increased lower extremity strength.   Baseline:  Goal status: INITIAL  ASSESSMENT:  CLINICAL IMPRESSION: Today's session with focus on lower extremity strengthening and balance; difficulty with coordinating movement; especially with hip abduction.  Unable to perform any standing exercises or transition exercises without UE support.  Patient continues with difficulty with  coordinating movements and sometimes has trouble following instructions; needs physical cues and demonstration to follow commands accurately.  Patient will benefit from continued skilled therapy services to address deficits and promote return to optimal function.       Eval:Patient is a 33 y.o. male who was seen today for physical therapy evaluation and treatment for cerebellar ataxia.  Patient demonstrates decreased strength, balance deficits and gait abnormalities which are negatively impacting patient ability to perform ADLs and functional mobility tasks. Patient will benefit from skilled physical therapy services to address these deficits to improve level of function with ADLs, functional mobility tasks, and reduce risk for falls.    OBJECTIVE IMPAIRMENTS: Abnormal gait, decreased activity tolerance, decreased balance, decreased coordination, decreased mobility, difficulty walking, decreased strength, decreased safety awareness, and impaired perceived functional ability.   ACTIVITY LIMITATIONS: carrying, lifting, bending, standing, squatting, stairs,  transfers, bathing, toileting, dressing, locomotion level, and caring for others  PARTICIPATION LIMITATIONS: meal prep, cleaning, laundry, medication management, driving, shopping, community activity, occupation, and yard work  [facility] POTENTIAL: Good  CLINICAL DECISION MAKING: Evolving/moderate complexity  EVALUATION COMPLEXITY: Moderate  PLAN:  PT FREQUENCY: 2x/week  PT DURATION: 4 weeks  PLANNED INTERVENTIONS: 97164- PT Re-evaluation, 97110-Therapeutic exercises, 97530- Therapeutic activity, [id number]- Neuromuscular re-education, 97535- Self Care, 16109- Manual therapy, [id number]- Gait training, [phone]- Orthotic Fit/training, [phone]- Canalith repositioning, [id number]- Aquatic Therapy, [phone]- Splinting, Patient/Family education, Balance training, Stair training, Taping, Dry Needling, Joint mobilization, Joint manipulation, Spinal manipulation, Spinal mobilization, Scar mobilization, and DME instructions.   PLAN FOR NEXT SESSION:  balance and lower extremity strengthening, functional movement and strengthening   11:44 AM, 04/05/23 Daziah Hesler Small Eldon Zietlow MPT [facility] [place] [phone] [phone]

## 2023-04-10 ENCOUNTER — Ambulatory Visit (HOSPITAL_COMMUNITY): Payer: Medicare (Managed Care)

## 2023-04-10 ENCOUNTER — Ambulatory Visit (HOSPITAL_COMMUNITY): Payer: Commercial Managed Care - PPO

## 2023-04-10 ENCOUNTER — Encounter (HOSPITAL_COMMUNITY): Payer: Self-pay

## 2023-04-10 DIAGNOSIS — R262 Difficulty in walking, not elsewhere classified: Secondary | ICD-10-CM

## 2023-04-10 DIAGNOSIS — G119 Hereditary ataxia, unspecified: Secondary | ICD-10-CM

## 2023-04-10 NOTE — Therapy (Signed)
OUTPATIENT PHYSICAL THERAPY NEURO TREATMENT   Patient Name: Samuel Stanley MRN: [id number] DOB:1990/03/15, 33 y.o., male Today's Date: 04/10/2023   PCP: Donzetta Sprung, MD REFERRING PROVIDER: [name], MD  END OF SESSION:  PT End of Session - 04/10/23 1233     Visit Number 5    Number of Visits 8    Date for PT Re-Evaluation 04/20/23    Authorization Type [place] [employer] PPO    PT Start Time 1158    PT Stop Time 1230    PT Time Calculation (min) 32 min    Activity Tolerance Patient tolerated treatment well    Behavior During Therapy [facility] for tasks assessed/performed               Past Medical History:  Diagnosis Date   Depression    Hypertension    Spinocerebellar ataxia type 17 (HCC)    Past Surgical History:  Procedure Laterality Date   HERNIA REPAIR     testicular torsion     Patient Active Problem List   Diagnosis Date Noted   Anterior dislocation of right shoulder 03/01/2016    ONSET DATE: about 5 years ago  REFERRING DIAG: G11.9 (ICD-10-CM) - Cerebellar ataxia (HCC)  THERAPY DIAG:  Cerebellar ataxia (HCC)  Difficulty in walking, not elsewhere classified  Rationale for Evaluation and Treatment: Rehabilitation  SUBJECTIVE:                                                                                                                                                                                             SUBJECTIVE STATEMENT: Pt switched spots with pt's grandfather's place.  Eval:Patient treated here earlier this year; continues with cerebellar ataxia; now using a rollator; reports no pain.  Issues with balance and leg weakness; cerebellar ataxia.  Has a 26 yo son ; married; symptoms started 5 years ago; gradually worsening; wife is a Engineer, civil (consulting); has had some PT in [place] but is too far too go.  Grandmother drove him here today. Pt accompanied by: self   Pt accompanied by: family member  PERTINENT HISTORY:   PAIN:  Are you having  pain? No  PRECAUTIONS: Fall  RED FLAGS: None   WEIGHT BEARING RESTRICTIONS: No  FALLS: Has patient fallen in last 6 months? No  LIVING ENVIRONMENT: Lives with: lives with their family and lives with their spouse Lives in: House/apartment Stairs: Yes: External: 2 steps; none Has following equipment at home: Walker - 4 wheeled     PLOF: Independent  PATIENT GOALS: get stronger; get off rollator  OBJECTIVE:  Note: Objective measures were completed at Evaluation unless otherwise  noted.  DIAGNOSTIC FINDINGS:   COGNITION: Overall cognitive status: Within functional limits for tasks assessed   SENSATION: WFL  COORDINATION: Impaired finger to nose     POSTURE: rounded shoulders and forward head  LOWER EXTREMITY ROM:     Active  Right Eval Left Eval  Hip flexion    Hip extension    Hip abduction    Hip adduction    Hip internal rotation    Hip external rotation    Knee flexion    Knee extension    Ankle dorsiflexion    Ankle plantarflexion    Ankle inversion    Ankle eversion     (Blank rows = not tested)  LOWER EXTREMITY MMT:    MMT Right Eval Left Eval  Hip flexion 4- 4-  Hip extension    Hip abduction    Hip adduction    Hip internal rotation    Hip external rotation    Knee flexion    Knee extension    Ankle dorsiflexion    Ankle plantarflexion    Ankle inversion    Ankle eversion    (Blank rows = not tested)  BED MOBILITY:  Not tested at eval  TRANSFERS: Assistive device utilized: Environmental consultant - 4 wheeled  Sit to stand: SBA Stand to sit: SBA Chair to chair:  not tested Floor:  not tested   STAIRS: Level of Assistance:  Psychologist, counselling Technique:  with  Number of Stairs:   Height of Stairs:   Comments:   GAIT: Gait pattern: decreased step length- Right, decreased step length- Left, decreased hip/knee flexion- Right, decreased hip/knee flexion- Left, and shuffling Distance walked: 50 ft in clinic Assistive device utilized:  Walker - 4 wheeled Level of assistance: SBA Comments: occasionally impulsive, sometimes gets too fast for safe ambulation   FUNCTIONAL TESTS:  5 times sit to stand: 45.64 using hands and rollator to assist up to standing; legs push against back of chair 2 minute walk test: 112 ft with rollator SLS unable  PATIENT SURVEYS:  ABC scale 68.8%                                                                                                                              TREATMENT DATE:  04/10/2023  -Standing and lifting tidal tank with controlled and no UE support 2 x 5 with CGA. Cues for slow, controlled lifting up and down of tidal tank. Pt with wide BOS and externally rotated BLE in standing -step training on 6in step in // bars multiple repetitions. Min-mod assist for balance and carryover of coordinated movement patterns.   -LLE with single UE assist equal to L hip while stepping up. Facilitation into L side shift onto box.   -L side shift with tactile cues for RLE step backwards with facilitation into left lateral weight shift.   -3lb ankle weights for added proprioception. -Tapping on 6in step with bilateral feet x 10 with min assist  for lateral shifting and cues for reduced UE support.    04/05/23 Standing in // bars Heel raises 2 x 10 Toe raises 2 x 10 Marching 2 x 10 Squats 2 x 10 Hip abduction 2 x 10 Hip extension 2 x 10 Toe taps on cone x 3 cones x 5 reps each Tandem stance x 10" x 2 each Standing small base of support 2 x 10" each  Sitting on edge of chair 3# bar reach overhead 2 x 10 Seated trunk rotation with yellow med ball x 5 each  Sit to stand holding yellow med ball x 5 with 1 UE assist    03/29/23: DGI 1. Gait level surface (1) Moderate Impairment: Walks 20', slow speed, abnormal gait pattern, evidence for imbalance. 2. Change in gait speed (1) Moderate Impairment: Makes only minor adjustments to walking speed, or accomplishes a change in speed with  significant gait deviations, or changes speed but has significant gait deviations, or changes speed but loses balance but is able to recover and continue walking. 3. Gait with horizontal head turns (0) Severe Impairment: Performs task with severe disruption of gait, i.e., staggers outside 15" path, loses balance, stops, reaches for wall. 4. Gait with vertical head turns 1) Moderate Impairment: Performs head turns with moderate change in gait velocity, slows down, staggers but recovers, can continue to walk. 5. Gait and pivot turn (1) Moderate Impairment: Turns slowly, requires verbal cueing, requires several small steps to catch balance following turn and stop. 6. Step over obstacle (0) Severe Impairment: Cannot perform without assistance. 7. Step around obstacles (1) Moderate Impairment: Is able to clear cones but must significantly slow, speed to accomplish task, or requires verbal cueing. 8. Stairs (1) Moderate Impairment: Two feet to a stair, must use rail.  TOTAL SCORE: 6 / 24  STS 10x  Marching with HHA Sidestep front of mat 2RT with walker   PATIENT EDUCATION: Education details: Patient educated on exam findings, POC, scope of PT, HEP, and what to expect next visit. Person educated: Patient Education method: Explanation, Demonstration, and Handouts Education comprehension: verbalized understanding, returned demonstration, verbal cues required, and tactile cues required   HOME EXERCISE PROGRAM: Access Code: [email or web address] URL: [email or web address] Date: 03/23/2023 Prepared by: [facility] - Rehab  Exercises - Sit to Stand with Counter Support  - 2 x daily - 7 x weekly - 1 sets - 10 reps - Standing March with Counter Support  - 2 x daily - 7 x weekly - 1 sets - 10 reps  GOALS: Goals reviewed with patient? No  SHORT TERM GOALS: Target date: 04/06/2023  patient will be independent with initial HEP  Baseline: Goal status: INITIAL  2.  Patient will self report 25%  improvement to improve tolerance for functional activity  Baseline:  Goal status: INITIAL  LONG TERM GOALS: Target date: 04/20/2023  Patient will be independent in self management strategies to improve quality of life and functional outcomes.  Baseline:  Goal status: INITIAL  2.  Patient will self report 50% improvement to improve tolerance for functional activity  Baseline:  Goal status: INITIAL  3.  Patient will be able to stand SLS x 4" each leg to demonstrate improved functional balance.   Baseline: unable Goal status: INITIAL  4.  Patient will increase his 2 MWT distance to 220 ft to demonstrate improved functional mobility Baseline: 112 ft with rollator Goal status: INITIAL  5.  Patient will improve 5 times sit to stand score from 54.64 sec to  sec to demonstrate improved functional mobility and increased lower extremity strength.   Baseline:  Goal status: INITIAL  ASSESSMENT:  CLINICAL IMPRESSION: Pt tolerating shorted session well. Continues to show poor coordination and timing with stair negotiation in parallel bars. Added 3lb ankle weights  to distal LE to assist with increased proprioception. Hand over hand cuing given as pt with poor decision making skills intermittently, posing increased safety risk. Patient will benefit from continued skilled therapy services to address deficits and promote return to optimal function.        OBJECTIVE IMPAIRMENTS: Abnormal gait, decreased activity tolerance, decreased balance, decreased coordination, decreased mobility, difficulty walking, decreased strength, decreased safety awareness, and impaired perceived functional ability.   ACTIVITY LIMITATIONS: carrying, lifting, bending, standing, squatting, stairs, transfers, bathing, toileting, dressing, locomotion level, and caring for others  PARTICIPATION LIMITATIONS: meal prep, cleaning, laundry, medication management, driving, shopping, community activity, occupation, and yard  work  [facility] POTENTIAL: Good  CLINICAL DECISION MAKING: Evolving/moderate complexity  EVALUATION COMPLEXITY: Moderate  PLAN:  PT FREQUENCY: 2x/week  PT DURATION: 4 weeks  PLANNED INTERVENTIONS: 97164- PT Re-evaluation, 97110-Therapeutic exercises, 97530- Therapeutic activity, [id number]- Neuromuscular re-education, 97535- Self Care, 16109- Manual therapy, [id number]- Gait training, [phone]- Orthotic Fit/training, [phone]- Canalith repositioning, [id number]- Aquatic Therapy, [phone]- Splinting, Patient/Family education, Balance training, Stair training, Taping, Dry Needling, Joint mobilization, Joint manipulation, Spinal manipulation, Spinal mobilization, Scar mobilization, and DME instructions.   PLAN FOR NEXT SESSION:  balance and lower extremity strengthening, functional movement and strengthening   12:33 PM, 04/10/23 [name] Small Lynch MPT [facility] [place] [phone] [phone]

## 2023-04-12 ENCOUNTER — Encounter (HOSPITAL_COMMUNITY): Payer: Self-pay

## 2023-04-12 ENCOUNTER — Ambulatory Visit (INDEPENDENT_AMBULATORY_CARE_PROVIDER_SITE_OTHER): Payer: Medicare (Managed Care) | Admitting: Clinical

## 2023-04-12 ENCOUNTER — Ambulatory Visit (HOSPITAL_COMMUNITY): Payer: Medicare (Managed Care)

## 2023-04-12 DIAGNOSIS — F331 Major depressive disorder, recurrent, moderate: Secondary | ICD-10-CM

## 2023-04-12 DIAGNOSIS — R270 Ataxia, unspecified: Secondary | ICD-10-CM

## 2023-04-12 DIAGNOSIS — R278 Other lack of coordination: Secondary | ICD-10-CM

## 2023-04-12 DIAGNOSIS — G119 Hereditary ataxia, unspecified: Secondary | ICD-10-CM

## 2023-04-12 DIAGNOSIS — R2681 Unsteadiness on feet: Secondary | ICD-10-CM

## 2023-04-12 DIAGNOSIS — F419 Anxiety disorder, unspecified: Secondary | ICD-10-CM | POA: Diagnosis not present

## 2023-04-12 DIAGNOSIS — R262 Difficulty in walking, not elsewhere classified: Secondary | ICD-10-CM

## 2023-04-12 NOTE — Therapy (Signed)
OUTPATIENT PHYSICAL THERAPY NEURO TREATMENT   Patient Name: Samuel Stanley MRN: [id number] DOB:December 01, 1990, 33 y.o., male Today's Date: 04/12/2023   PCP: Donzetta Sprung, MD REFERRING PROVIDER: [name], MD  END OF SESSION:  PT End of Session - 04/12/23 1035     Visit Number 6    Number of Visits 8    Date for PT Re-Evaluation 04/20/23    Authorization Type [facility] [employer]    PT Start Time 1036    PT Stop Time 1118    PT Time Calculation (min) 42 min    Activity Tolerance Patient tolerated treatment well    Behavior During Therapy [facility] for tasks assessed/performed               Past Medical History:  Diagnosis Date   Depression    Hypertension    Spinocerebellar ataxia type 17 (HCC)    Past Surgical History:  Procedure Laterality Date   HERNIA REPAIR     testicular torsion     Patient Active Problem List   Diagnosis Date Noted   Anterior dislocation of right shoulder 03/01/2016    ONSET DATE: about 5 years ago  REFERRING DIAG: G11.9 (ICD-10-CM) - Cerebellar ataxia (HCC)  THERAPY DIAG:  Cerebellar ataxia (HCC)  Difficulty in walking, not elsewhere classified  Unsteadiness on feet  Other lack of coordination  Ataxia  Rationale for Evaluation and Treatment: Rehabilitation  SUBJECTIVE:                                                                                                                                                                                             SUBJECTIVE STATEMENT: Pt reports he went up and down stairs with wife's assistance.    Eval:Patient treated here earlier this year; continues with cerebellar ataxia; now using a rollator; reports no pain.  Issues with balance and leg weakness; cerebellar ataxia.  Has a 22 yo son ; married; symptoms started 5 years ago; gradually worsening; wife is a Engineer, civil (consulting); has had some PT in [place] but is too far too go.  Grandmother drove him here today. Pt accompanied by: self   Pt  accompanied by: family member  PERTINENT HISTORY:   PAIN:  Are you having pain? No  PRECAUTIONS: Fall  RED FLAGS: None   WEIGHT BEARING RESTRICTIONS: No  FALLS: Has patient fallen in last 6 months? No  LIVING ENVIRONMENT: Lives with: lives with their family and lives with their spouse Lives in: House/apartment Stairs: Yes: External: 2 steps; none Has following equipment at home: Walker - 4 wheeled     PLOF: Independent  PATIENT GOALS:  get stronger; get off rollator  OBJECTIVE:  Note: Objective measures were completed at Evaluation unless otherwise noted.  DIAGNOSTIC FINDINGS:   COGNITION: Overall cognitive status: Within functional limits for tasks assessed   SENSATION: WFL  COORDINATION: Impaired finger to nose     POSTURE: rounded shoulders and forward head  LOWER EXTREMITY ROM:     Active  Right Eval Left Eval  Hip flexion    Hip extension    Hip abduction    Hip adduction    Hip internal rotation    Hip external rotation    Knee flexion    Knee extension    Ankle dorsiflexion    Ankle plantarflexion    Ankle inversion    Ankle eversion     (Blank rows = not tested)  LOWER EXTREMITY MMT:    MMT Right Eval Left Eval  Hip flexion 4- 4-  Hip extension    Hip abduction    Hip adduction    Hip internal rotation    Hip external rotation    Knee flexion    Knee extension    Ankle dorsiflexion    Ankle plantarflexion    Ankle inversion    Ankle eversion    (Blank rows = not tested)  BED MOBILITY:  Not tested at eval  TRANSFERS: Assistive device utilized: Environmental consultant - 4 wheeled  Sit to stand: SBA Stand to sit: SBA Chair to chair:  not tested Floor:  not tested   STAIRS: Level of Assistance:  Psychologist, counselling Technique:  with  Number of Stairs:   Height of Stairs:   Comments:   GAIT: Gait pattern: decreased step length- Right, decreased step length- Left, decreased hip/knee flexion- Right, decreased hip/knee flexion- Left,  and shuffling Distance walked: 50 ft in clinic Assistive device utilized: Walker - 4 wheeled Level of assistance: SBA Comments: occasionally impulsive, sometimes gets too fast for safe ambulation   FUNCTIONAL TESTS:  5 times sit to stand: 45.64 using hands and rollator to assist up to standing; legs push against back of chair 2 minute walk test: 112 ft with rollator SLS unable  PATIENT SURVEYS:  ABC scale 68.8%                                                                                                                              TREATMENT DATE:  04/12/23: -Nustep UE/LE [place] trail x 5' SPM 85 Quadruped: crawling forward/backward  working on sequencing Lt arm, Rt leg, Rt arm, Lt leg...   Weight shifting forward/backward   UE reaching forward alternating 5x 3"   LE extension alternating 5x 3" Standing inside // bars:   Heel to toe gait 3RT   Alternating toe tapping then heel tapping 10x each  04/10/2023  -Standing and lifting tidal tank with controlled and no UE support 2 x 5 with CGA. Cues for slow, controlled lifting up and down of tidal tank. Pt with wide BOS and externally rotated  BLE in standing -step training on 6in step in // bars multiple repetitions. Min-mod assist for balance and carryover of coordinated movement patterns.   -LLE with single UE assist equal to L hip while stepping up. Facilitation into L side shift onto box.   -L side shift with tactile cues for RLE step backwards with facilitation into left lateral weight shift.   -3lb ankle weights for added proprioception. -Tapping on 6in step with bilateral feet x 10 with min assist for lateral shifting and cues for reduced UE support.    04/05/23 Standing in // bars Heel raises 2 x 10 Toe raises 2 x 10 Marching 2 x 10 Squats 2 x 10 Hip abduction 2 x 10 Hip extension 2 x 10 Toe taps on cone x 3 cones x 5 reps each Tandem stance x 10" x 2 each Standing small base of support 2 x 10" each  Sitting on edge  of chair 3# bar reach overhead 2 x 10 Seated trunk rotation with yellow med ball x 5 each  Sit to stand holding yellow med ball x 5 with 1 UE assist    03/29/23: DGI 1. Gait level surface (1) Moderate Impairment: Walks 20', slow speed, abnormal gait pattern, evidence for imbalance. 2. Change in gait speed (1) Moderate Impairment: Makes only minor adjustments to walking speed, or accomplishes a change in speed with significant gait deviations, or changes speed but has significant gait deviations, or changes speed but loses balance but is able to recover and continue walking. 3. Gait with horizontal head turns (0) Severe Impairment: Performs task with severe disruption of gait, i.e., staggers outside 15" path, loses balance, stops, reaches for wall. 4. Gait with vertical head turns 1) Moderate Impairment: Performs head turns with moderate change in gait velocity, slows down, staggers but recovers, can continue to walk. 5. Gait and pivot turn (1) Moderate Impairment: Turns slowly, requires verbal cueing, requires several small steps to catch balance following turn and stop. 6. Step over obstacle (0) Severe Impairment: Cannot perform without assistance. 7. Step around obstacles (1) Moderate Impairment: Is able to clear cones but must significantly slow, speed to accomplish task, or requires verbal cueing. 8. Stairs (1) Moderate Impairment: Two feet to a stair, must use rail.  TOTAL SCORE: 6 / 24  STS 10x  Marching with HHA Sidestep front of mat 2RT with walker   PATIENT EDUCATION: Education details: Patient educated on exam findings, POC, scope of PT, HEP, and what to expect next visit. Person educated: Patient Education method: Explanation, Demonstration, and Handouts Education comprehension: verbalized understanding, returned demonstration, verbal cues required, and tactile cues required   HOME EXERCISE PROGRAM: Access Code: [email or web address] URL:  [email or web address] Date: 03/23/2023 Prepared by: [facility] - Rehab  Exercises - Sit to Stand with Counter Support  - 2 x daily - 7 x weekly - 1 sets - 10 reps - Standing March with Counter Support  - 2 x daily - 7 x weekly - 1 sets - 10 reps  GOALS: Goals reviewed with patient? No  SHORT TERM GOALS: Target date: 04/06/2023  patient will be independent with initial HEP  Baseline: Goal status: INITIAL  2.  Patient will self report 25% improvement to improve tolerance for functional activity  Baseline:  Goal status: INITIAL  LONG TERM GOALS: Target date: 04/20/2023  Patient will be independent in self management strategies to improve quality of life and functional outcomes.  Baseline:  Goal status: INITIAL  2.  Patient will self report  50% improvement to improve tolerance for functional activity  Baseline:  Goal status: INITIAL  3.  Patient will be able to stand SLS x 4" each leg to demonstrate improved functional balance.   Baseline: unable Goal status: INITIAL  4.  Patient will increase his 2 MWT distance to 220 ft to demonstrate improved functional mobility Baseline: 112 ft with rollator Goal status: INITIAL  5.  Patient will improve 5 times sit to stand score from 54.64 sec to  sec to demonstrate improved functional mobility and increased lower extremity strength.   Baseline:  Goal status: INITIAL  ASSESSMENT:  CLINICAL IMPRESSION: Session focus with sequencing and proximal strengthening.  Began session on Nustep to improve UE/LE sequence.  Quadruped activities including crawling with moderate cueing to sequencing and reaching activities with moderate difficulty.  Moderate cueing for heel to toe gait mechanics. Therapist feels pt.'s walker may be encouraging short steps, try different walker next session to assess improvements.      OBJECTIVE IMPAIRMENTS: Abnormal gait, decreased activity tolerance, decreased balance, decreased coordination,  decreased mobility, difficulty walking, decreased strength, decreased safety awareness, and impaired perceived functional ability.   ACTIVITY LIMITATIONS: carrying, lifting, bending, standing, squatting, stairs, transfers, bathing, toileting, dressing, locomotion level, and caring for others  PARTICIPATION LIMITATIONS: meal prep, cleaning, laundry, medication management, driving, shopping, community activity, occupation, and yard work  [facility] POTENTIAL: Good  CLINICAL DECISION MAKING: Evolving/moderate complexity  EVALUATION COMPLEXITY: Moderate  PLAN:  PT FREQUENCY: 2x/week  PT DURATION: 4 weeks  PLANNED INTERVENTIONS: 97164- PT Re-evaluation, 97110-Therapeutic exercises, 97530- Therapeutic activity, [id number]- Neuromuscular re-education, 97535- Self Care, [id number]- Manual therapy, [id number]- Gait training, [phone]- Orthotic Fit/training, [phone]- Canalith repositioning, [id number]- Aquatic Therapy, [phone]- Splinting, Patient/Family education, Balance training, Stair training, Taping, Dry Needling, Joint mobilization, Joint manipulation, Spinal manipulation, Spinal mobilization, Scar mobilization, and DME instructions.   PLAN FOR NEXT SESSION:  balance and lower extremity strengthening, functional movement and strengthening.  Try alternative walker to promote larger steps.   Becky Sax, LPTA/CLT; CBIS [phone]  11:37 AM, 04/12/23

## 2023-04-12 NOTE — Progress Notes (Signed)
IN PERSON   I connected with Samuel Stanley on 04/12/23 at  9:00 AM EST in person and verified that I am speaking with the correct person using two identifiers.  Location: Patient: office Provider: office   I discussed the limitations of evaluation and management by telemedicine and the availability of in person appointments. The patient expressed understanding and agreed to proceed. (IN PERSON)    THERAPIST PROGRESS NOTE   Session Time: 9:00 AM-9:45 AM   Participation Level: Active   Behavioral Response: CasualAlertAnxious   Type of Therapy: Individual Therapy   Treatment Goals addressed: Coping Anger Management   Interventions: CBT, Motivational Interviewing, Solution Focused and Supportive   Summary: Samuel Stanley is a 33 y.o. male who presents with  Depression and anxiety. The OPT therapist worked with the patient for his scheduled session. The OPT therapist utilized Motivational Interviewing to assist in creating therapeutic repore. The patient spoke about having difficulty with his wifes schedule and finding his own adjustments with what his body will allow him to do on a day to day basis. 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 The patient continues to utlize his existing support network with individual effort to maintain as much independence as he can.The patient continues to struggle with his physical health condition and will be working with his physical therapist .The OPT therapist worked with the patient encouraging positive communication with his partner from a non judgmental way.The patient does his PT MWF  each week.The OPT therapist over-viewed with the patient upcoming appointments as listed in his MyChart.   Suicidal/Homicidal: Nowithout intent/plan   Therapist Response: The OPT therapist worked with the patient for the patients scheduled session. The patient was engaged in his session and gave feedback in relation to triggers, symptoms, and behavior responses over the past few weeks through January 2025. The OPT therapist worked with the patient utilizing an in session Cognitive Behavioral Therapy exercise. The patient was responsive in the session and verbalized," I wish Samuel Stanley was not on the phone as much and on facebook with her friends from work and we sat our phones down more she will stay on it late at night and the light will keep me up".The OPT therapist worked with the patient overviewing his support symptoms and coping strategies. The patient spoke about his work to stay active in the home while his wife is at work helping with what he can in relation to in home cleaning, chores, and pet caregiving.  The patient spoke about his worry around his wife's work schedule stress.  The OPT therapist overviewed the interaction between the patient and his wife and the patient indicated over the past few weeks interaction has been positive. The patient spoke about his ongoing work and involvement with PT for his physical health condition. The patient noted his wife did decide to take some time off from work  around her birthday March 10th and he will be going with his wife and child on a vacation around that time possibly to [facility]. The OPT therapist worked with the patient overviewing appointments listed in the patients MyChart. The OPT therapist will continue  treatment work with the patient in his next scheduled session.     Plan: Return again in 2/3 weeks.   Diagnosis:      Axis I: Recurrent moderate major depressive disorder with anxiety                            Axis II: No diagnosis      Collaboration of Care: No additional collaboration of care. .   Patient/Guardian was advised Release of Information must be obtained prior to any record release in order to collaborate their care with an outside provider. Patient/Guardian was advised if they have not already done so to contact the registration department to sign all necessary forms in order for [place] to release information regarding their care.    Consent: Patient/Guardian gives verbal consent for treatment and assignment of benefits for services provided during this visit. Patient/Guardian expressed understanding and agreed to proceed.    I discussed the assessment and treatment plan with the patient. The patient was provided an opportunity to ask questions and all were answered. The patient agreed with the plan and demonstrated an understanding of the instructions.   The patient was advised to call back or seek an in-person evaluation if the symptoms worsen or if the condition fails to improve as anticipated.   I provided 45 minutes of face-to-face time during this encounter.   Samuel Burn, LCSW   04/12/2023

## 2023-04-17 ENCOUNTER — Ambulatory Visit (HOSPITAL_COMMUNITY): Payer: Medicare (Managed Care) | Attending: Family Medicine

## 2023-04-17 ENCOUNTER — Encounter (HOSPITAL_COMMUNITY): Payer: Self-pay

## 2023-04-17 DIAGNOSIS — R270 Ataxia, unspecified: Secondary | ICD-10-CM | POA: Diagnosis not present

## 2023-04-17 DIAGNOSIS — R262 Difficulty in walking, not elsewhere classified: Secondary | ICD-10-CM | POA: Diagnosis not present

## 2023-04-17 DIAGNOSIS — R2681 Unsteadiness on feet: Secondary | ICD-10-CM | POA: Insufficient documentation

## 2023-04-17 DIAGNOSIS — R278 Other lack of coordination: Secondary | ICD-10-CM | POA: Insufficient documentation

## 2023-04-17 DIAGNOSIS — M6281 Muscle weakness (generalized): Secondary | ICD-10-CM | POA: Diagnosis not present

## 2023-04-17 DIAGNOSIS — G119 Hereditary ataxia, unspecified: Secondary | ICD-10-CM | POA: Insufficient documentation

## 2023-04-17 DIAGNOSIS — R2689 Other abnormalities of gait and mobility: Secondary | ICD-10-CM | POA: Diagnosis not present

## 2023-04-17 NOTE — Therapy (Signed)
 OUTPATIENT PHYSICAL THERAPY NEURO TREATMENT   Patient Name: Samuel Stanley MRN: [id number] DOB:Oct 17, 1990, 33 y.o., male Today's Date: 04/17/2023   PCP: Jerel Sieving, MD REFERRING PROVIDER: Merilee Rockey Boas, MD  END OF SESSION:  PT End of Session - 04/17/23 1118     Visit Number 7    Number of Visits 8    Date for PT Re-Evaluation 04/20/23    Authorization Type [employer]    PT Start Time 1118   late arrival   PT Stop Time 1148    PT Time Calculation (min) 30 min    Activity Tolerance Patient tolerated treatment well    Behavior During Therapy [facility] for tasks assessed/performed               Past Medical History:  Diagnosis Date   Depression    Hypertension    Spinocerebellar ataxia type 17 (HCC)    Past Surgical History:  Procedure Laterality Date   HERNIA REPAIR     testicular torsion     Patient Active Problem List   Diagnosis Date Noted   Anterior dislocation of right shoulder 03/01/2016    ONSET DATE: about 5 years ago  REFERRING DIAG: G11.9 (ICD-10-CM) - Cerebellar ataxia (HCC)  THERAPY DIAG:  Cerebellar ataxia (HCC)  Difficulty in walking, not elsewhere classified  Unsteadiness on feet  Rationale for Evaluation and Treatment: Rehabilitation  SUBJECTIVE:                                                                                                                                                                                             SUBJECTIVE STATEMENT: Pt reports he went up and down stairs with wife's assistance.    Eval:Patient treated here earlier this year; continues with cerebellar ataxia; now using a rollator; reports no pain.  Issues with balance and leg weakness; cerebellar ataxia.  Has a 43 yo son ; married; symptoms started 5 years ago; gradually worsening; wife is a engineer, civil (consulting); has had some PT in [place] but is too far too go.  Grandmother drove him here today. Pt accompanied by: self   Pt accompanied by: family  member  PERTINENT HISTORY:   PAIN:  Are you having pain? No  PRECAUTIONS: Fall  RED FLAGS: None   WEIGHT BEARING RESTRICTIONS: No  FALLS: Has patient fallen in last 6 months? No  LIVING ENVIRONMENT: Lives with: lives with their family and lives with their spouse Lives in: House/apartment Stairs: Yes: External: 2 steps; none Has following equipment at home: Walker - 4 wheeled     PLOF: Independent  PATIENT GOALS: get stronger; get off  rollator  OBJECTIVE:  Note: Objective measures were completed at Evaluation unless otherwise noted.  DIAGNOSTIC FINDINGS:   COGNITION: Overall cognitive status: Within functional limits for tasks assessed   SENSATION: WFL  COORDINATION: Impaired finger to nose     POSTURE: rounded shoulders and forward head  LOWER EXTREMITY ROM:     Active  Right Eval Left Eval  Hip flexion    Hip extension    Hip abduction    Hip adduction    Hip internal rotation    Hip external rotation    Knee flexion    Knee extension    Ankle dorsiflexion    Ankle plantarflexion    Ankle inversion    Ankle eversion     (Blank rows = not tested)  LOWER EXTREMITY MMT:    MMT Right Eval Left Eval  Hip flexion 4- 4-  Hip extension    Hip abduction    Hip adduction    Hip internal rotation    Hip external rotation    Knee flexion    Knee extension    Ankle dorsiflexion    Ankle plantarflexion    Ankle inversion    Ankle eversion    (Blank rows = not tested)  BED MOBILITY:  Not tested at eval  TRANSFERS: Assistive device utilized: Environmental Consultant - 4 wheeled  Sit to stand: SBA Stand to sit: SBA Chair to chair:  not tested Floor:  not tested   STAIRS: Level of Assistance:  Psychologist, Counselling Technique:  with  Number of Stairs:   Height of Stairs:   Comments:   GAIT: Gait pattern: decreased step length- Right, decreased step length- Left, decreased hip/knee flexion- Right, decreased hip/knee flexion- Left, and shuffling Distance  walked: 50 ft in clinic Assistive device utilized: Walker - 4 wheeled Level of assistance: SBA Comments: occasionally impulsive, sometimes gets too fast for safe ambulation   FUNCTIONAL TESTS:  5 times sit to stand: 45.64 using hands and rollator to assist up to standing; legs push against back of chair 2 minute walk test: 112 ft with rollator SLS unable  PATIENT SURVEYS:  ABC scale 68.8%                                                                                                                              TREATMENT DATE:  04/17/23: Gait training with normal size RW 275ft cueing for equal stride length and heel to toe mechanics Agility ladder 2RT inside // bars cueing for equal stride length and heel to toe mechanics  04/12/23: -Nustep UE/LE [place] trail x 5' SPM 85 Quadruped: crawling forward/backward  working on sequencing Lt arm, Rt leg, Rt arm, Lt leg...   Weight shifting forward/backward   UE reaching forward alternating 5x 3   LE extension alternating 5x 3 Standing inside // bars:   Heel to toe gait 3RT   Alternating toe tapping then heel tapping 10x each  04/10/2023  -Standing and lifting  tidal tank with controlled and no UE support 2 x 5 with CGA. Cues for slow, controlled lifting up and down of tidal tank. Pt with wide BOS and externally rotated BLE in standing -step training on 6in step in // bars multiple repetitions. Min-mod assist for balance and carryover of coordinated movement patterns.   -LLE with single UE assist equal to L hip while stepping up. Facilitation into L side shift onto box.   -L side shift with tactile cues for RLE step backwards with facilitation into left lateral weight shift.   -3lb ankle weights for added proprioception. -Tapping on 6in step with bilateral feet x 10 with min assist for lateral shifting and cues for reduced UE support.    04/05/23 Standing in // bars Heel raises 2 x 10 Toe raises 2 x 10 Marching 2 x 10 Squats 2 x  10 Hip abduction 2 x 10 Hip extension 2 x 10 Toe taps on cone x 3 cones x 5 reps each Tandem stance x 10 x 2 each Standing small base of support 2 x 10 each  Sitting on edge of chair 3# bar reach overhead 2 x 10 Seated trunk rotation with yellow med ball x 5 each  Sit to stand holding yellow med ball x 5 with 1 UE assist    03/29/23: DGI 1. Gait level surface (1) Moderate Impairment: Walks 20', slow speed, abnormal gait pattern, evidence for imbalance. 2. Change in gait speed (1) Moderate Impairment: Makes only minor adjustments to walking speed, or accomplishes a change in speed with significant gait deviations, or changes speed but has significant gait deviations, or changes speed but loses balance but is able to recover and continue walking. 3. Gait with horizontal head turns (0) Severe Impairment: Performs task with severe disruption of gait, i.e., staggers outside 15 path, loses balance, stops, reaches for wall. 4. Gait with vertical head turns 1) Moderate Impairment: Performs head turns with moderate change in gait velocity, slows down, staggers but recovers, can continue to walk. 5. Gait and pivot turn (1) Moderate Impairment: Turns slowly, requires verbal cueing, requires several small steps to catch balance following turn and stop. 6. Step over obstacle (0) Severe Impairment: Cannot perform without assistance. 7. Step around obstacles (1) Moderate Impairment: Is able to clear cones but must significantly slow, speed to accomplish task, or requires verbal cueing. 8. Stairs (1) Moderate Impairment: Two feet to a stair, must use rail.  TOTAL SCORE: 6 / 24  STS 10x  Marching with HHA Sidestep front of mat 2RT with walker   PATIENT EDUCATION: Education details: Patient educated on exam findings, POC, scope of PT, HEP, and what to expect next visit. Person educated: Patient Education method: Explanation, Demonstration, and Handouts Education comprehension: verbalized  understanding, returned demonstration, verbal cues required, and tactile cues required   HOME EXERCISE PROGRAM: Access Code: [email or web address] URL: [email or web address] Date: 03/23/2023 Prepared by: [facility] - Rehab  Exercises - Sit to Stand with Counter Support  - 2 x daily - 7 x weekly - 1 sets - 10 reps - Standing March with Counter Support  - 2 x daily - 7 x weekly - 1 sets - 10 reps  GOALS: Goals reviewed with patient? No  SHORT TERM GOALS: Target date: 04/06/2023  patient will be independent with initial HEP  Baseline: Goal status: INITIAL  2.  Patient will self report 25% improvement to improve tolerance for functional activity  Baseline:  Goal status: INITIAL  LONG TERM GOALS: Target date:  04/20/2023  Patient will be independent in self management strategies to improve quality of life and functional outcomes.  Baseline:  Goal status: INITIAL  2.  Patient will self report 50% improvement to improve tolerance for functional activity  Baseline:  Goal status: INITIAL  3.  Patient will be able to stand SLS x 4 each leg to demonstrate improved functional balance.   Baseline: unable Goal status: INITIAL  4.  Patient will increase his 2 MWT distance to 220 ft to demonstrate improved functional mobility Baseline: 112 ft with rollator Goal status: INITIAL  5.  Patient will improve 5 times sit to stand score from 54.64 sec to  sec to demonstrate improved functional mobility and increased lower extremity strength.   Baseline:  Goal status: INITIAL  ASSESSMENT:  CLINICAL IMPRESSION: Pt arrived ambulating with his narrow based RW, gait training complete with wider frame and presents with improved step length.  Pt required min A for safety and cueing for heel to toe mechanics and equal stride length.  Pt stated he owns a walker similar to the one used in therapy today, encouraged to try as pt is too tall and with narrow walker encourages him to ambulate with  decrease step length and NBOS, discussed with pt and grandmother with verbalized understanding.  Added agility ladder to improve step length that was tolerated well.    OBJECTIVE IMPAIRMENTS: Abnormal gait, decreased activity tolerance, decreased balance, decreased coordination, decreased mobility, difficulty walking, decreased strength, decreased safety awareness, and impaired perceived functional ability.   ACTIVITY LIMITATIONS: carrying, lifting, bending, standing, squatting, stairs, transfers, bathing, toileting, dressing, locomotion level, and caring for others  PARTICIPATION LIMITATIONS: meal prep, cleaning, laundry, medication management, driving, shopping, community activity, occupation, and yard work  [facility] POTENTIAL: Good  CLINICAL DECISION MAKING: Evolving/moderate complexity  EVALUATION COMPLEXITY: Moderate  PLAN:  PT FREQUENCY: 2x/week  PT DURATION: 4 weeks  PLANNED INTERVENTIONS: 97164- PT Re-evaluation, 97110-Therapeutic exercises, 97530- Therapeutic activity, [id number]- Neuromuscular re-education, 97535- Self Care, [id number]- Manual therapy, [id number]- Gait training, [phone]- Orthotic Fit/training, [phone]- Canalith repositioning, [id number]- Aquatic Therapy, [phone]- Splinting, Patient/Family education, Balance training, Stair training, Taping, Dry Needling, Joint mobilization, Joint manipulation, Spinal manipulation, Spinal mobilization, Scar mobilization, and DME instructions.   PLAN FOR NEXT SESSION:  balance and lower extremity strengthening, functional movement and strengthening.  Reassessment next session.  F/U with trade off of walker to improve gait mechanics.     Augustin Mclean, LPTA/CLT; [facility] [phone]  4:25 PM, 04/17/23

## 2023-04-19 ENCOUNTER — Ambulatory Visit (HOSPITAL_COMMUNITY): Payer: Medicare (Managed Care)

## 2023-04-19 DIAGNOSIS — G119 Hereditary ataxia, unspecified: Secondary | ICD-10-CM

## 2023-04-19 DIAGNOSIS — R278 Other lack of coordination: Secondary | ICD-10-CM

## 2023-04-19 DIAGNOSIS — R262 Difficulty in walking, not elsewhere classified: Secondary | ICD-10-CM

## 2023-04-19 DIAGNOSIS — R2681 Unsteadiness on feet: Secondary | ICD-10-CM

## 2023-04-19 NOTE — Therapy (Signed)
 OUTPATIENT PHYSICAL THERAPY NEURO TREATMENT/PROGRESS NOTE Progress Note Reporting Period 03/23/23 to 04/20/23  See note below for Objective Data and Assessment of Progress/Goals.       Patient Name: Samuel Stanley MRN: [id number] DOB:[birth year], 33 y.o., male Today's Date: 04/19/2023   PCP: Jerel Sieving, MD REFERRING PROVIDER: Merilee Rockey Boas, MD  END OF SESSION:  PT End of Session - 04/19/23 1020     Visit Number 8    Number of Visits 16    Date for PT Re-Evaluation 05/19/23    Authorization Type [place] [employer]    PT Start Time 1020    PT Stop Time 1100    PT Time Calculation (min) 40 min    Activity Tolerance Patient tolerated treatment well    Behavior During Therapy [facility] for tasks assessed/performed               Past Medical History:  Diagnosis Date   Depression    Hypertension    Spinocerebellar ataxia type 17 (HCC)    Past Surgical History:  Procedure Laterality Date   HERNIA REPAIR     testicular torsion     Patient Active Problem List   Diagnosis Date Noted   Anterior dislocation of right shoulder 03/01/2016    ONSET DATE: about 5 years ago  REFERRING DIAG: G11.9 (ICD-10-CM) - Cerebellar ataxia (HCC)  THERAPY DIAG:  Cerebellar ataxia (HCC) - Plan: PT plan of care cert/re-cert  Difficulty in walking, not elsewhere classified - Plan: PT plan of care cert/re-cert  Unsteadiness on feet - Plan: PT plan of care cert/re-cert  Other lack of coordination - Plan: PT plan of care cert/re-cert  Rationale for Evaluation and Treatment: Rehabilitation  SUBJECTIVE:                                                                                                                                                                                             SUBJECTIVE STATEMENT: Patient arrives with 3 wheeled rollator; he feels like he is doing a little better  Eval:Patient treated here earlier this year; continues with cerebellar ataxia; now using a  rollator; reports no pain.  Issues with balance and leg weakness; cerebellar ataxia.  Has a 2 yo son ; married; symptoms started 5 years ago; gradually worsening; wife is a engineer, civil (consulting); has had some PT in [place] but is too far too go.  Grandmother drove him here today. Pt accompanied by: self   Pt accompanied by: family member  PERTINENT HISTORY:   PAIN:  Are you having pain? No  PRECAUTIONS: Fall  RED FLAGS: None   WEIGHT BEARING RESTRICTIONS: No  FALLS: Has patient fallen in last 6 months? No  LIVING ENVIRONMENT: Lives with: lives with their family and lives with their spouse Lives in: House/apartment Stairs: Yes: External: 2 steps; none Has following equipment at home: Walker - 4 wheeled     PLOF: Independent  PATIENT GOALS: get stronger; get off rollator  OBJECTIVE:  Note: Objective measures were completed at Evaluation unless otherwise noted.  DIAGNOSTIC FINDINGS:   COGNITION: Overall cognitive status: Within functional limits for tasks assessed   SENSATION: WFL  COORDINATION: Impaired finger to nose     POSTURE: rounded shoulders and forward head  LOWER EXTREMITY ROM:     Active  Right Eval Left Eval  Hip flexion    Hip extension    Hip abduction    Hip adduction    Hip internal rotation    Hip external rotation    Knee flexion    Knee extension    Ankle dorsiflexion    Ankle plantarflexion    Ankle inversion    Ankle eversion     (Blank rows = not tested)  LOWER EXTREMITY MMT:    MMT Right Eval Right 04/19/23 Left Eval Left 04/19/23  Hip flexion 4- 5 4- 5  Hip extension      Hip abduction      Hip adduction      Hip internal rotation      Hip external rotation      Knee flexion      Knee extension      Ankle dorsiflexion      Ankle plantarflexion      Ankle inversion      Ankle eversion      (Blank rows = not tested)  BED MOBILITY:  Not tested at eval  TRANSFERS: Assistive device utilized: Environmental Consultant - 4 wheeled  Sit to  stand: SBA Stand to sit: SBA Chair to chair:  not tested Floor:  not tested   STAIRS: Level of Assistance:  Psychologist, Counselling Technique:  with  Number of Stairs:   Height of Stairs:   Comments:   GAIT: Gait pattern: decreased step length- Right, decreased step length- Left, decreased hip/knee flexion- Right, decreased hip/knee flexion- Left, and shuffling Distance walked: 50 ft in clinic Assistive device utilized: Walker - 4 wheeled Level of assistance: SBA Comments: occasionally impulsive, sometimes gets too fast for safe ambulation   FUNCTIONAL TESTS:  5 times sit to stand: 45.64 using hands and rollator to assist up to standing; legs push against back of chair 2 minute walk test: 112 ft with rollator SLS unable  PATIENT SURVEYS:  ABC scale 68.8%                                                                                                                              TREATMENT DATE:  04/19/23 Progress note 2 MWT 185 ft with 3 wheeled rollator 5 x sit to stand 47.02 using hands to assist up ABC scale  100% Hip F strength 5/5 SLS unable without 1 HHA; 1 HHA x 10 each leg Dgi 9/24 DGI 1. Gait level surface (1) Moderate Impairment: Walks 20', slow speed, abnormal gait pattern, evidence for imbalance. 2. Change in gait speed (1) Moderate Impairment: Makes only minor adjustments to walking speed, or accomplishes a change in speed with significant gait deviations, or changes speed but has significant gait deviations, or changes speed but loses balance but is able to recover and continue walking. 3. Gait with horizontal head turns (1) Moderate Impairment: Performs head turns with moderate change in gait velocity, slows down, staggers but recovers, can continue to walk. 4. Gait with vertical head turns 1) Moderate Impairment: Performs head turns with moderate change in gait velocity, slows down, staggers but recovers, can continue to walk. 5. Gait and pivot turn (2) Mild  Impairment: Pivot turns safely in > 3 seconds and stops with no loss of balance. 6. Step over obstacle (0) Severe Impairment: Cannot perform without assistance. 7. Step around obstacles (1) Moderate Impairment: Is able to clear cones but must significantly slow, speed to accomplish task, or requires verbal cueing. 8. Stairs (2) Mild Impairment: Alternating feet, must use rail.  TOTAL SCORE: 9 / 24  Updated HEP   04/17/23: Gait training with normal size RW 21ft cueing for equal stride length and heel to toe mechanics Agility ladder 2RT inside // bars cueing for equal stride length and heel to toe mechanics  04/12/23: -Nustep UE/LE [place] trail x 5' SPM 85 Quadruped: crawling forward/backward  working on sequencing Lt arm, Rt leg, Rt arm, Lt leg...   Weight shifting forward/backward   UE reaching forward alternating 5x 3   LE extension alternating 5x 3 Standing inside // bars:   Heel to toe gait 3RT   Alternating toe tapping then heel tapping 10x each  04/10/2023  -Standing and lifting tidal tank with controlled and no UE support 2 x 5 with CGA. Cues for slow, controlled lifting up and down of tidal tank. Pt with wide BOS and externally rotated BLE in standing -step training on 6in step in // bars multiple repetitions. Min-mod assist for balance and carryover of coordinated movement patterns.   -LLE with single UE assist equal to L hip while stepping up. Facilitation into L side shift onto box.   -L side shift with tactile cues for RLE step backwards with facilitation into left lateral weight shift.   -3lb ankle weights for added proprioception. -Tapping on 6in step with bilateral feet x 10 with min assist for lateral shifting and cues for reduced UE support.    04/05/23 Standing in // bars Heel raises 2 x 10 Toe raises 2 x 10 Marching 2 x 10 Squats 2 x 10 Hip abduction 2 x 10 Hip extension 2 x 10 Toe taps on cone x 3 cones x 5 reps each Tandem stance x 10 x 2  each Standing small base of support 2 x 10 each  Sitting on edge of chair 3# bar reach overhead 2 x 10 Seated trunk rotation with yellow med ball x 5 each  Sit to stand holding yellow med ball x 5 with 1 UE assist    03/29/23: DGI 1. Gait level surface (1) Moderate Impairment: Walks 20', slow speed, abnormal gait pattern, evidence for imbalance. 2. Change in gait speed (1) Moderate Impairment: Makes only minor adjustments to walking speed, or accomplishes a change in speed with significant gait deviations, or changes speed but has significant gait  deviations, or changes speed but loses balance but is able to recover and continue walking. 3. Gait with horizontal head turns (0) Severe Impairment: Performs task with severe disruption of gait, i.e., staggers outside 15 path, loses balance, stops, reaches for wall. 4. Gait with vertical head turns 1) Moderate Impairment: Performs head turns with moderate change in gait velocity, slows down, staggers but recovers, can continue to walk. 5. Gait and pivot turn (1) Moderate Impairment: Turns slowly, requires verbal cueing, requires several small steps to catch balance following turn and stop. 6. Step over obstacle (0) Severe Impairment: Cannot perform without assistance. 7. Step around obstacles (1) Moderate Impairment: Is able to clear cones but must significantly slow, speed to accomplish task, or requires verbal cueing. 8. Stairs (1) Moderate Impairment: Two feet to a stair, must use rail.  TOTAL SCORE: 6 / 24  STS 10x  Marching with HHA Sidestep front of mat 2RT with walker   PATIENT EDUCATION: Education details: Patient educated on exam findings, POC, scope of PT, HEP, and what to expect next visit. Person educated: Patient Education method: Explanation, Demonstration, and Handouts Education comprehension: verbalized understanding, returned demonstration, verbal cues required, and tactile cues required   HOME EXERCISE  PROGRAM: Access Code: [email or web address] URL: [email or web address] Date: 04/19/2023 Prepared by: [facility] - Rehab  Exercises - Sit to Stand with Counter Support  - 2 x daily - 7 x weekly - 1 sets - 15 reps - Standing March with Counter Support  - 2 x daily - 7 x weekly - 1 sets - 15 reps - Heel Raises with Counter Support  - 1 x daily - 7 x weekly - 1 sets - 15 reps - Standing Hip Abduction with Counter Support  - 1 x daily - 7 x weekly - 1 sets - 15 reps - Standing Single Leg Stance with Counter Support  - 1 x daily - 7 x weekly - 1 sets - 10 reps Access Code: [email or web address] URL: [email or web address] Date: 03/23/2023 Prepared by: [facility] - Rehab  Exercises - Sit to Stand with Counter Support  - 2 x daily - 7 x weekly - 1 sets - 10 reps - Standing March with Counter Support  - 2 x daily - 7 x weekly - 1 sets - 10 reps  GOALS: Goals reviewed with patient? No  SHORT TERM GOALS: Target date: 04/06/2023  patient will be independent with initial HEP  Baseline: Goal status: met  2.  Patient will self report 25% improvement to improve tolerance for functional activity  Baseline:  Goal status: in progress  LONG TERM GOALS: Target date: 04/20/2023  Patient will be independent in self management strategies to improve quality of life and functional outcomes.  Baseline:  Goal status: in progress  2.  Patient will self report 50% improvement to improve tolerance for functional activity  Baseline:  Goal status: in progress  3.  Patient will be able to stand SLS x 4 each leg to demonstrate improved functional balance.   Baseline: unable 04/19/23 unless has one HHA Goal status: in progress  4.  Patient will increase his 2 MWT distance to 220 ft to demonstrate improved functional mobility Baseline: 112 ft with rollator; 185 ft with rollator 04/19/23 Goal status: in progress  5.  Patient will improve 5 times sit to stand score from 54.64 sec to 40  sec to demonstrate improved  functional mobility and increased lower extremity strength.   Baseline: 47.02 sec using hands to assist Goal status: in progress  ASSESSMENT:  CLINICAL IMPRESSION: Progress note today.  Good improvement with 2 MWT, hip F MMT and dgi noted.  Patient with unrealistic score on ABC score; states he is fine and is not going to fall.  Patient unable to perform SLS without use of 1 hand to assist.  Updated HEP and discussed with him the importance of compliance with HEP.  Patient will benefit from continued skilled therapy services to address deficits and promote return to optimal function and to address remaining unmet and partially met goals.       OBJECTIVE IMPAIRMENTS: Abnormal gait, decreased activity tolerance, decreased balance, decreased coordination, decreased mobility, difficulty walking, decreased strength, decreased safety awareness, and impaired perceived functional ability.   ACTIVITY LIMITATIONS: carrying, lifting, bending, standing, squatting, stairs, transfers, bathing, toileting, dressing, locomotion level, and caring for others  PARTICIPATION LIMITATIONS: meal prep, cleaning, laundry, medication management, driving, shopping, community activity, occupation, and yard work  [facility] POTENTIAL: Good  CLINICAL DECISION MAKING: Evolving/moderate complexity  EVALUATION COMPLEXITY: Moderate  PLAN:  PT FREQUENCY: 2x/week  PT DURATION: 4 weeks  PLANNED INTERVENTIONS: 97164- PT Re-evaluation, 97110-Therapeutic exercises, 97530- Therapeutic activity, [id number]- Neuromuscular re-education, 97535- Self Care, [id number]- Manual therapy, [id number]- Gait training, [phone]- Orthotic Fit/training, [phone]- Canalith repositioning, [id number]- Aquatic Therapy, [phone]- Splinting, Patient/Family education, Balance training, Stair training, Taping, Dry Needling, Joint mobilization, Joint manipulation, Spinal manipulation, Spinal mobilization, Scar mobilization, and DME instructions.   PLAN FOR NEXT SESSION:  balance  and lower extremity strengthening, functional movement and strengthening.  Extend 2 x a week for 4 weeks to address remaining unmet and partially met goals  11:13 AM, 04/19/23 Ajla Mcgeachy Small Bisma Klett MPT [place] [facility] [place] [phone] Ph:[phone]

## 2023-04-25 ENCOUNTER — Ambulatory Visit (INDEPENDENT_AMBULATORY_CARE_PROVIDER_SITE_OTHER): Payer: Medicare (Managed Care) | Admitting: Clinical

## 2023-04-25 DIAGNOSIS — F331 Major depressive disorder, recurrent, moderate: Secondary | ICD-10-CM

## 2023-04-25 DIAGNOSIS — F419 Anxiety disorder, unspecified: Secondary | ICD-10-CM | POA: Diagnosis not present

## 2023-04-25 NOTE — Progress Notes (Signed)
IN PERSON    I connected with Samuel Stanley on 04/25/23 at  1:00 PM EST in person and verified that I am speaking with the correct person using two identifiers.   Location: Patient: office Provider: office   I discussed the limitations of evaluation and management by telemedicine and the availability of in person appointments. The patient expressed understanding and agreed to proceed. (IN PERSON)       THERAPIST PROGRESS NOTE   Session Time: 1:00 PM-1:45 PM   Participation Level: Active   Behavioral Response: CasualAlertAnxious   Type of Therapy: Individual Therapy   Treatment Goals addressed: Coping Anger Management   Interventions: CBT, Motivational Interviewing, Solution Focused and Supportive   Summary: Samuel Stanley is a 33 y.o. male who presents with  Depression and anxiety. The OPT therapist worked with the patient for his scheduled session. The OPT therapist utilized Motivational Interviewing to assist in creating therapeutic repore. The patient spoke about having difficulty with his wifes schedule and finding his own adjustments with what his body will allow him to do on a day to day basis. 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 The patient spoke about looking forward to his wife being off tomorrow as she has taken a day off and her transitioning from 12hr shifts to 10hr shifts at her workplace. The patient spoke about upcoming plans for the weekend going to the [facility] for [place] Fest.The OPT therapist utilized Cognitive Behavioral Therapy through cognitive restructuring as well as worked with the patient on coping strategies to assist in management of GAD. The patient continues to utlize his existing support network with individual effort to maintain as much independence  as he can.The patient continues to struggle with his physical health condition and continues work with his physical therapist .The OPT therapist worked with the patient encouraging positive communication with his partner from a non judgmental way.The patient does his PT MWF each week.The OPT therapist over-viewed with the patient upcoming appointments as listed in his MyChart.   Suicidal/Homicidal: Nowithout intent/plan   Therapist Response: The OPT therapist worked with the patient for the patients scheduled session. The patient was engaged in his session and gave feedback in relation to triggers, symptoms, and behavior responses over the past few weeks . The OPT therapist worked with the patient utilizing an in session Cognitive Behavioral Therapy exercise. The patient was responsive in the session and verbalized," We made plans to go to [place] 2 months ago but today right at lunch [name] let me know she invited a friend from work , which made me upset I didn't want the friend to go and I wanted [name] to talk to me before she changed plans because we made plans for this 2 months ago". The OPT therapist worked with the patient on communication with his partner. The OPT therapist worked with the patient overviewing his support symptoms and coping strategies. The patient spoke about his work to stay active in the home while his wife is at work helping with what he can in relation to in home cleaning, chores, and pet caregiving.  The patient spoke about his worry around his wife's work schedule stress.  The OPT therapist overviewed the interaction between the patient and his wife and the patient indicated over the past  few weeks interaction has been positive. The patient spoke about his ongoing work and involvement with PT for his physical health condition. The patient noted his wife did decide to take some time off from work  around her birthday  and he will be going with his wife and child on a vacation around  that time possibly to [facility]. The patient noted he is looking forward to celebrating Valentines Holiday with his wife on Saturday due to her having to work on Friday. The OPT therapist worked with the patient overviewing appointments listed in the patients MyChart. The OPT therapist will continue treatment work with the patient in his next scheduled session.     Plan: Return again in 2/3 weeks.   Diagnosis:      Axis I: Recurrent moderate major depressive disorder with anxiety                            Axis II: No diagnosis     Collaboration of Care: No additional collaboration of care. .   Patient/Guardian was advised Release of Information must be obtained prior to any record release in order to collaborate their care with an outside provider. Patient/Guardian was advised if they have not already done so to contact the registration department to sign all necessary forms in order for [place] to release information regarding their care.    Consent: Patient/Guardian gives verbal consent for treatment and assignment of benefits for services provided during this visit. Patient/Guardian expressed understanding and agreed to proceed.    I discussed the assessment and treatment plan with the patient. The patient was provided an opportunity to ask questions and all were answered. The patient agreed with the plan and demonstrated an understanding of the instructions.   The patient was advised to call back or seek an in-person evaluation if the symptoms worsen or if the condition fails to improve as anticipated.   I provided 45 minutes of face-to-face time during this encounter.   Winfred Burn, LCSW   04/25/2023

## 2023-04-30 ENCOUNTER — Other Ambulatory Visit (HOSPITAL_COMMUNITY): Payer: Self-pay

## 2023-05-02 ENCOUNTER — Ambulatory Visit
Admission: EM | Admit: 2023-05-02 | Discharge: 2023-05-02 | Disposition: A | Discharge status: Home / Self Care | Payer: Medicare (Managed Care) | Attending: Nurse Practitioner | Admitting: Nurse Practitioner

## 2023-05-02 ENCOUNTER — Ambulatory Visit (HOSPITAL_COMMUNITY): Payer: Medicare (Managed Care)

## 2023-05-02 DIAGNOSIS — S60413A Abrasion of left middle finger, initial encounter: Secondary | ICD-10-CM

## 2023-05-02 DIAGNOSIS — W540XXA Bitten by dog, initial encounter: Secondary | ICD-10-CM | POA: Diagnosis not present

## 2023-05-02 DIAGNOSIS — Z23 Encounter for immunization: Secondary | ICD-10-CM

## 2023-05-02 MED ORDER — AMOXICILLIN-POT CLAVULANATE 875-125 MG PO TABS: 1.0000 | ORAL_TABLET | Freq: Two times a day (BID) | ORAL | 0 refills | Status: AC

## 2023-05-02 MED ORDER — TETANUS-DIPHTH-ACELL PERTUSSIS 5-2.5-18.5 LF-MCG/0.5 IM SUSY
0.5000 mL | PREFILLED_SYRINGE | Freq: Once | INTRAMUSCULAR | Status: AC
Start: 2023-05-02 — End: 2023-05-02
  Administered 2023-05-02: 0.5 mL via INTRAMUSCULAR

## 2023-05-02 NOTE — ED Notes (Signed)
Site cleaned and dressed by CMA prior to tetanus administration and discharge instructions being reviewed.

## 2023-05-02 NOTE — ED Provider Notes (Signed)
[facility]    CSN: [id number] Arrival date & time: 05/02/23  1153      History   Chief Complaint No chief complaint on file.   HPI Samuel Stanley is a 33 y.o. male.   The history is provided by the patient and a relative.   Patient brought in by family for complaints of a dog bite to the left middle finger.  Patient states that the dog was in the chair, and when he went to move the dog, the dog's teeth clipped his left middle finger.  Patient with an abrasion to the left middle finger.  He states area is tender to touch.  Denies fever, chills, chest pain, abdominal pain, oozing, or drainage from the site.  Patient is unsure of his last tetanus shot.  States that the dog's immunizations are up-to-date. Past Medical History:  Diagnosis Date   Depression    Hypertension    Spinocerebellar ataxia type 17 [facility])     Patient Active Problem List   Diagnosis Date Noted   Anterior dislocation of right shoulder 03/01/2016    Past Surgical History:  Procedure Laterality Date   HERNIA REPAIR     testicular torsion         Home Medications    Prior to Admission medications   Medication Sig Start Date End Date Taking? Authorizing Provider  acetaminophen (TYLENOL) 325 MG tablet Take 650 mg by mouth every 6 (six) hours as needed.    [provider]  buPROPion (WELLBUTRIN XL) 300 MG 24 hr tablet Take 1 tablet (300 mg total) by mouth daily. 01/31/23     divalproex (DEPAKOTE ER) 500 MG 24 hr tablet Take 1 tablet (500 mg total) by mouth daily. 09/14/21     ibuprofen (ADVIL) 400 MG tablet Take 400 mg by mouth every 6 (six) hours as needed.    [provider]  meclizine (ANTIVERT) 25 MG tablet TAKE 1 TABLET(25 MG) BY MOUTH THREE TIMES DAILY AS NEEDED FOR DIZZINESS 10/20/19   Everlena Cooper, Adam R, DO  risperiDONE (RISPERDAL) 2 MG tablet Take 1 tablet (2 mg total) by mouth 2 (two) times daily. 06/26/22   Richardean Chimera, MD  sertraline (ZOLOFT) 50 MG tablet Take 50 mg by  mouth daily.    [provider]  DULoxetine (CYMBALTA) 60 MG capsule Take 1 capsule (60 mg total) by mouth daily for depression 05/13/22 06/26/22      Family History Family History  Problem Relation Age of Onset   Healthy Mother    Healthy Father    Cancer Paternal Grandmother    Cancer Paternal Grandfather     Social History Social History   Tobacco Use   Smoking status: Never   Smokeless tobacco: Current    Types: Chew   Tobacco comments:    smokeless tobacco   Vaping Use   Vaping status: Never Used  Substance Use Topics   Alcohol use: No   Drug use: No     Allergies   Patient has no known allergies.   Review of Systems Review of Systems Per HPI  Physical Exam Triage Vital Signs ED Triage Vitals [05/02/23 1213]  Encounter Vitals Group     BP (!) 156/96     Systolic BP Percentile      Diastolic BP Percentile      Pulse Rate (!) 114     Resp 16     Temp 98.6 F (37 C)     Temp Source Oral  SpO2 96 %     Weight      Height      Head Circumference      Peak Flow      Pain Score      Pain Loc      Pain Education      Exclude from Growth Chart    No data found.  Updated Vital Signs BP (!) 156/96 (BP Location: Right Arm)   Pulse (!) 114   Temp 98.6 F (37 C) (Oral)   Resp 16   SpO2 96%   Visual Acuity Right Eye Distance:   Left Eye Distance:   Bilateral Distance:    Right Eye Near:   Left Eye Near:    Bilateral Near:     Physical Exam Vitals and nursing note reviewed.  Constitutional:      General: He is not in acute distress.    Appearance: Normal appearance.  HENT:     Head: Normocephalic.  Eyes:     Extraocular Movements: Extraocular movements intact.     Pupils: Pupils are equal, round, and reactive to light.  Pulmonary:     Effort: Pulmonary effort is normal.  Musculoskeletal:     Cervical back: Normal range of motion.  Skin:    General: Skin is warm and dry.     Findings: Laceration present.     Comments: Skin  abrasion noted to lateral left middle finger next to index finger. Bleeding controlled. Localized tenderness. No oozing, drainage or fluctuance present.    Neurological:     General: No focal deficit present.     Mental Status: He is alert and oriented to person, place, and time.  Psychiatric:        Mood and Affect: Mood normal.        Behavior: Behavior normal.      UC Treatments / Results  Labs (all labs ordered are listed, but only abnormal results are displayed) Labs Reviewed - No data to display  EKG   Radiology No results found.  Procedures Procedures (including critical care time)  Medications Ordered in UC Medications - No data to display  Initial Impression / Assessment and Plan / UC Course  I have reviewed the triage vital signs and the nursing notes.  Pertinent labs & imaging results that were available during my care of the patient were reviewed by me and considered in my medical decision making (see chart for details).  Moderate abrasion noted to the left middle finger.  Tdap was updated today.  Will start patient on Augmentin 875/2025 mg tablets twice daily for the next 7 days for prophylaxis.  Supportive care recommendations were provided and discussed with the patient and his family to include over-the-counter analgesics, applying ice to the affected area, wound care instructions, and monitoring for worsening symptoms.  Discussed indications regarding follow-up.  Patient and family member were in agreement with this plan of care and verbalized understanding.  All questions were answered.  Patient stable for discharge.  Of note, stairstep ED was in to speak with patient during appointment regarding the dog bite.  Final Clinical Impressions(s) / UC Diagnoses   Final diagnoses:  None   Discharge Instructions   None    ED Prescriptions   None    PDMP not reviewed this encounter.   Abran Cantor, NP 05/02/23 1400

## 2023-05-02 NOTE — Discharge Instructions (Addendum)
Your Tdap has been updated today.  It is good for the next 10 years. Leave the dressing in place for the next 24 hours. Remove the dressing and clean the area with warm water or with antibacterial soap/wash in 24 hours.  Recommend over-the-counter Dial gold bar soap.   When you are at home, you may leave the area open to air.  When you are out, recommend keeping the area covered. Do no allow your pet to lick or come into contact with the affected area. May use over-the-counter Neosporin or triple antibiotic ointment to the affected area. May take over-the-counter Ibuprofen or Tylenol for pain or discomfort. Apply ice as needed to help with pain and swelling.  Apply for 20 minutes, remove for 1 hour, repeat as needed. Elevate the left upper extremity is much as possible to help with pain and swelling. Follow-up immediately if you develop swelling, increased redness that goes into the hand or up the arm, foul-smelling drainage, or if you develop fever, chills, or other concerns. Follow-up as needed.

## 2023-05-02 NOTE — ED Triage Notes (Addendum)
Pt reports dog bite on the left middle finger, happened this morning was pt's dog, has been vaccinated for the rabies. Pt states he was in his chair the dog climbed up in the chair and went to move the dog and the dog nipped at him.

## 2023-05-04 ENCOUNTER — Ambulatory Visit (HOSPITAL_COMMUNITY): Payer: Medicare (Managed Care)

## 2023-05-04 DIAGNOSIS — R262 Difficulty in walking, not elsewhere classified: Secondary | ICD-10-CM

## 2023-05-04 DIAGNOSIS — R2681 Unsteadiness on feet: Secondary | ICD-10-CM

## 2023-05-04 DIAGNOSIS — G119 Hereditary ataxia, unspecified: Secondary | ICD-10-CM | POA: Diagnosis not present

## 2023-05-04 DIAGNOSIS — R278 Other lack of coordination: Secondary | ICD-10-CM

## 2023-05-04 NOTE — Therapy (Signed)
OUTPATIENT PHYSICAL THERAPY NEURO TREATMENT     Patient Name: Samuel Stanley MRN: [id number] DOB:April 24, 1990, 33 y.o., male Today's Date: 05/04/2023   PCP: Donzetta Sprung, MD REFERRING PROVIDER: [name], MD  END OF SESSION:  PT End of Session - 05/04/23 1115     Visit Number 9    Number of Visits 16    Date for PT Re-Evaluation 05/19/23    Authorization Type [place] [employer]    PT Start Time 1105    PT Stop Time 1144    PT Time Calculation (min) 39 min    Activity Tolerance Patient tolerated treatment well    Behavior During Therapy [facility] for tasks assessed/performed               Past Medical History:  Diagnosis Date   Depression    Hypertension    Spinocerebellar ataxia type 17 (HCC)    Past Surgical History:  Procedure Laterality Date   HERNIA REPAIR     testicular torsion     Patient Active Problem List   Diagnosis Date Noted   Anterior dislocation of right shoulder 03/01/2016    ONSET DATE: about 5 years ago  REFERRING DIAG: G11.9 (ICD-10-CM) - Cerebellar ataxia (HCC)  THERAPY DIAG:  Cerebellar ataxia (HCC)  Difficulty in walking, not elsewhere classified  Unsteadiness on feet  Other lack of coordination  Rationale for Evaluation and Treatment: Rehabilitation  SUBJECTIVE:                                                                                                                                                                                             SUBJECTIVE STATEMENT: Patient suffered a dog bite left hand wednesday; middle finger; seen at ED; has dermabond on left hand.  Patient reports no pain  Eval:Patient treated here earlier this year; continues with cerebellar ataxia; now using a rollator; reports no pain.  Issues with balance and leg weakness; cerebellar ataxia.  Has a 7 yo son ; married; symptoms started 5 years ago; gradually worsening; wife is a Engineer, civil (consulting); has had some PT in [place] but is too far too go.   Grandmother drove him here today. Pt accompanied by: self   Pt accompanied by: family member  PERTINENT HISTORY:   PAIN:  Are you having pain? No  PRECAUTIONS: Fall  RED FLAGS: None   WEIGHT BEARING RESTRICTIONS: No  FALLS: Has patient fallen in last 6 months? No  LIVING ENVIRONMENT: Lives with: lives with their family and lives with their spouse Lives in: House/apartment Stairs: Yes: External: 2 steps; none Has following equipment at home: Walker - 4  wheeled     PLOF: Independent  PATIENT GOALS: get stronger; get off rollator  OBJECTIVE:  Note: Objective measures were completed at Evaluation unless otherwise noted.  DIAGNOSTIC FINDINGS:   COGNITION: Overall cognitive status: Within functional limits for tasks assessed   SENSATION: WFL  COORDINATION: Impaired finger to nose     POSTURE: rounded shoulders and forward head  LOWER EXTREMITY ROM:     Active  Right Eval Left Eval  Hip flexion    Hip extension    Hip abduction    Hip adduction    Hip internal rotation    Hip external rotation    Knee flexion    Knee extension    Ankle dorsiflexion    Ankle plantarflexion    Ankle inversion    Ankle eversion     (Blank rows = not tested)  LOWER EXTREMITY MMT:    MMT Right Eval Right 04/19/23 Left Eval Left 04/19/23  Hip flexion 4- 5 4- 5  Hip extension      Hip abduction      Hip adduction      Hip internal rotation      Hip external rotation      Knee flexion      Knee extension      Ankle dorsiflexion      Ankle plantarflexion      Ankle inversion      Ankle eversion      (Blank rows = not tested)  BED MOBILITY:  Not tested at eval  TRANSFERS: Assistive device utilized: Environmental consultant - 4 wheeled  Sit to stand: SBA Stand to sit: SBA Chair to chair:  not tested Floor:  not tested   STAIRS: Level of Assistance:  Psychologist, counselling Technique:  with  Number of Stairs:   Height of Stairs:   Comments:   GAIT: Gait pattern: decreased  step length- Right, decreased step length- Left, decreased hip/knee flexion- Right, decreased hip/knee flexion- Left, and shuffling Distance walked: 50 ft in clinic Assistive device utilized: Walker - 4 wheeled Level of assistance: SBA Comments: occasionally impulsive, sometimes gets too fast for safe ambulation   FUNCTIONAL TESTS:  5 times sit to stand: 45.64 using hands and rollator to assist up to standing; legs push against back of chair 2 minute walk test: 112 ft with rollator SLS unable  PATIENT SURVEYS:  ABC scale 68.8%                                                                                                                              TREATMENT DATE:  05/04/23 Standing:// bars Heel toe raises x 15 Marching x 15 Toe tapping different color cones right leg, left leg and then alternating legs Sit to stand using hands to assist up to standing 2 x 5 trying to stand for 5" unsupported between reps Sidestepping // bars down and back x 5 with CGA  04/19/23 Progress note 2 MWT 185 ft with  3 wheeled rollator 5 x sit to stand 47.02 using hands to assist up ABC scale 100% Hip F strength 5/5 SLS unable without 1 HHA; 1 HHA x 10" each leg Dgi 9/24 DGI 1. Gait level surface (1) Moderate Impairment: Walks 20', slow speed, abnormal gait pattern, evidence for imbalance. 2. Change in gait speed (1) Moderate Impairment: Makes only minor adjustments to walking speed, or accomplishes a change in speed with significant gait deviations, or changes speed but has significant gait deviations, or changes speed but loses balance but is able to recover and continue walking. 3. Gait with horizontal head turns (1) Moderate Impairment: Performs head turns with moderate change in gait velocity, slows down, staggers but recovers, can continue to walk. 4. Gait with vertical head turns 1) Moderate Impairment: Performs head turns with moderate change in gait velocity, slows down, staggers but recovers,  can continue to walk. 5. Gait and pivot turn (2) Mild Impairment: Pivot turns safely in > 3 seconds and stops with no loss of balance. 6. Step over obstacle (0) Severe Impairment: Cannot perform without assistance. 7. Step around obstacles (1) Moderate Impairment: Is able to clear cones but must significantly slow, speed to accomplish task, or requires verbal cueing. 8. Stairs (2) Mild Impairment: Alternating feet, must use rail.  TOTAL SCORE: 9 / 24  Updated HEP   04/17/23: Gait training with normal size RW 24ft cueing for equal stride length and heel to toe mechanics Agility ladder 2RT inside // bars cueing for equal stride length and heel to toe mechanics  04/12/23: -Nustep UE/LE [place] trail x 5' SPM 85 Quadruped: crawling forward/backward  working on sequencing Lt arm, Rt leg, Rt arm, Lt leg...   Weight shifting forward/backward   UE reaching forward alternating 5x 3"   LE extension alternating 5x 3" Standing inside // bars:   Heel to toe gait 3RT   Alternating toe tapping then heel tapping 10x each  04/10/2023  -Standing and lifting tidal tank with controlled and no UE support 2 x 5 with CGA. Cues for slow, controlled lifting up and down of tidal tank. Pt with wide BOS and externally rotated BLE in standing -step training on 6in step in // bars multiple repetitions. Min-mod assist for balance and carryover of coordinated movement patterns.   -LLE with single UE assist equal to L hip while stepping up. Facilitation into L side shift onto box.   -L side shift with tactile cues for RLE step backwards with facilitation into left lateral weight shift.   -3lb ankle weights for added proprioception. -Tapping on 6in step with bilateral feet x 10 with min assist for lateral shifting and cues for reduced UE support.    04/05/23 Standing in // bars Heel raises 2 x 10 Toe raises 2 x 10 Marching 2 x 10 Squats 2 x 10 Hip abduction 2 x 10 Hip extension 2 x 10 Toe taps on cone x 3  cones x 5 reps each Tandem stance x 10" x 2 each Standing small base of support 2 x 10" each  Sitting on edge of chair 3# bar reach overhead 2 x 10 Seated trunk rotation with yellow med ball x 5 each  Sit to stand holding yellow med ball x 5 with 1 UE assist    03/29/23: DGI 1. Gait level surface (1) Moderate Impairment: Walks 20', slow speed, abnormal gait pattern, evidence for imbalance. 2. Change in gait speed (1) Moderate Impairment: Makes only minor adjustments to walking speed, or  accomplishes a change in speed with significant gait deviations, or changes speed but has significant gait deviations, or changes speed but loses balance but is able to recover and continue walking. 3. Gait with horizontal head turns (0) Severe Impairment: Performs task with severe disruption of gait, i.e., staggers outside 15" path, loses balance, stops, reaches for wall. 4. Gait with vertical head turns 1) Moderate Impairment: Performs head turns with moderate change in gait velocity, slows down, staggers but recovers, can continue to walk. 5. Gait and pivot turn (1) Moderate Impairment: Turns slowly, requires verbal cueing, requires several small steps to catch balance following turn and stop. 6. Step over obstacle (0) Severe Impairment: Cannot perform without assistance. 7. Step around obstacles (1) Moderate Impairment: Is able to clear cones but must significantly slow, speed to accomplish task, or requires verbal cueing. 8. Stairs (1) Moderate Impairment: Two feet to a stair, must use rail.  TOTAL SCORE: 6 / 24  STS 10x  Marching with HHA Sidestep front of mat 2RT with walker   PATIENT EDUCATION: Education details: Patient educated on exam findings, POC, scope of PT, HEP, and what to expect next visit. Person educated: Patient Education method: Explanation, Demonstration, and Handouts Education comprehension: verbalized understanding, returned demonstration, verbal cues required, and  tactile cues required   HOME EXERCISE PROGRAM: Access Code: [email or web address] URL: [email or web address] Date: 04/19/2023 Prepared by: [facility] - Rehab  Exercises - Sit to Stand with Counter Support  - 2 x daily - 7 x weekly - 1 sets - 15 reps - Standing March with Counter Support  - 2 x daily - 7 x weekly - 1 sets - 15 reps - Heel Raises with Counter Support  - 1 x daily - 7 x weekly - 1 sets - 15 reps - Standing Hip Abduction with Counter Support  - 1 x daily - 7 x weekly - 1 sets - 15 reps - Standing Single Leg Stance with Counter Support  - 1 x daily - 7 x weekly - 1 sets - 10 reps Access Code: [email or web address] URL: [email or web address] Date: 03/23/2023 Prepared by: [facility] - Rehab  Exercises - Sit to Stand with Counter Support  - 2 x daily - 7 x weekly - 1 sets - 10 reps - Standing March with Counter Support  - 2 x daily - 7 x weekly - 1 sets - 10 reps  GOALS: Goals reviewed with patient? No  SHORT TERM GOALS: Target date: 04/06/2023  patient will be independent with initial HEP  Baseline: Goal status: met  2.  Patient will self report 25% improvement to improve tolerance for functional activity  Baseline:  Goal status: in progress  LONG TERM GOALS: Target date: 04/20/2023  Patient will be independent in self management strategies to improve quality of life and functional outcomes.  Baseline:  Goal status: in progress  2.  Patient will self report 50% improvement to improve tolerance for functional activity  Baseline:  Goal status: in progress  3.  Patient will be able to stand SLS x 4" each leg to demonstrate improved functional balance.   Baseline: unable 04/19/23 unless has one HHA Goal status: in progress  4.  Patient will increase his 2 MWT distance to 220 ft to demonstrate improved functional mobility Baseline: 112 ft with rollator; 185 ft with rollator 04/19/23 Goal status: in progress  5.  Patient will improve 5 times sit to stand score from 54.64  sec to 40  sec to demonstrate improved functional mobility and increased  lower extremity strength.   Baseline: 47.02 sec using hands to assist Goal status: in progress  ASSESSMENT:  CLINICAL IMPRESSION: Continued with balance training and functional strengthening.  Needs continued cues to not look down at his feet. Patient continues with heavy reliance on upper extremities for balance and decreased reaction time.  Max difficulty with trying to stand without upper extremity support.  Uses back of legs on chair to assist with balance.    Patient will benefit from continued skilled therapy services to address deficits and promote return to optimal function and to address remaining unmet and partially met goals.       OBJECTIVE IMPAIRMENTS: Abnormal gait, decreased activity tolerance, decreased balance, decreased coordination, decreased mobility, difficulty walking, decreased strength, decreased safety awareness, and impaired perceived functional ability.   ACTIVITY LIMITATIONS: carrying, lifting, bending, standing, squatting, stairs, transfers, bathing, toileting, dressing, locomotion level, and caring for others  PARTICIPATION LIMITATIONS: meal prep, cleaning, laundry, medication management, driving, shopping, community activity, occupation, and yard work  [facility] POTENTIAL: Good  CLINICAL DECISION MAKING: Evolving/moderate complexity  EVALUATION COMPLEXITY: Moderate  PLAN:  PT FREQUENCY: 2x/week  PT DURATION: 4 weeks  PLANNED INTERVENTIONS: 97164- PT Re-evaluation, 97110-Therapeutic exercises, 97530- Therapeutic activity, [id number]- Neuromuscular re-education, 97535- Self Care, 29562- Manual therapy, [id number]- Gait training, [phone]- Orthotic Fit/training, [phone]- Canalith repositioning, [id number]- Aquatic Therapy, [phone]- Splinting, Patient/Family education, Balance training, Stair training, Taping, Dry Needling, Joint mobilization, Joint manipulation, Spinal manipulation, Spinal mobilization, Scar  mobilization, and DME instructions.   PLAN FOR NEXT SESSION:  balance and lower extremity strengthening, functional movement and strengthening.  11:49 AM, 05/04/23 Dayleen Beske Small Philomina Leon MPT [facility] [place] [phone]

## 2023-05-07 ENCOUNTER — Ambulatory Visit (HOSPITAL_COMMUNITY): Payer: Medicare (Managed Care)

## 2023-05-07 DIAGNOSIS — D649 Anemia, unspecified: Secondary | ICD-10-CM | POA: Diagnosis not present

## 2023-05-07 DIAGNOSIS — E7849 Other hyperlipidemia: Secondary | ICD-10-CM | POA: Diagnosis not present

## 2023-05-07 DIAGNOSIS — Z1329 Encounter for screening for other suspected endocrine disorder: Secondary | ICD-10-CM | POA: Diagnosis not present

## 2023-05-07 DIAGNOSIS — I10 Essential (primary) hypertension: Secondary | ICD-10-CM | POA: Diagnosis not present

## 2023-05-07 DIAGNOSIS — E782 Mixed hyperlipidemia: Secondary | ICD-10-CM | POA: Diagnosis not present

## 2023-05-08 ENCOUNTER — Ambulatory Visit (INDEPENDENT_AMBULATORY_CARE_PROVIDER_SITE_OTHER): Payer: Medicare (Managed Care) | Admitting: Clinical

## 2023-05-08 DIAGNOSIS — F419 Anxiety disorder, unspecified: Secondary | ICD-10-CM

## 2023-05-08 DIAGNOSIS — F331 Major depressive disorder, recurrent, moderate: Secondary | ICD-10-CM
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
## 2023-05-09 ENCOUNTER — Ambulatory Visit (HOSPITAL_COMMUNITY): Payer: Medicare (Managed Care)

## 2023-05-09 DIAGNOSIS — R270 Ataxia, unspecified: Secondary | ICD-10-CM

## 2023-05-09 DIAGNOSIS — R2689 Other abnormalities of gait and mobility: Secondary | ICD-10-CM

## 2023-05-09 DIAGNOSIS — R262 Difficulty in walking, not elsewhere classified: Secondary | ICD-10-CM

## 2023-05-09 DIAGNOSIS — R2681 Unsteadiness on feet: Secondary | ICD-10-CM

## 2023-05-09 DIAGNOSIS — G119 Hereditary ataxia, unspecified: Secondary | ICD-10-CM

## 2023-05-09 DIAGNOSIS — R278 Other lack of coordination: Secondary | ICD-10-CM

## 2023-05-09 NOTE — Therapy (Signed)
 OUTPATIENT PHYSICAL THERAPY NEURO TREATMENT     Patient Name: Samuel Stanley MRN: [id number] DOB:1990/04/06, 33 y.o., male Today's Date: 05/09/2023   PCP: Donzetta Sprung, MD REFERRING PROVIDER: [name], MD  END OF SESSION:  PT End of Session - 05/09/23 1059     Visit Number 10    Number of Visits 16    Date for PT Re-Evaluation 05/19/23    Authorization Type [facility] [employer]    PT Start Time 1059    PT Stop Time 1143    PT Time Calculation (min) 44 min    Activity Tolerance Patient tolerated treatment well    Behavior During Therapy WFL for tasks assessed/performed               Past Medical History:  Diagnosis Date   Depression    Hypertension    Spinocerebellar ataxia type 17 (HCC)    Past Surgical History:  Procedure Laterality Date   HERNIA REPAIR     testicular torsion     Patient Active Problem List   Diagnosis Date Noted   Anterior dislocation of right shoulder 03/01/2016    ONSET DATE: about 5 years ago  REFERRING DIAG: G11.9 (ICD-10-CM) - Cerebellar ataxia (HCC)  THERAPY DIAG:  Cerebellar ataxia (HCC)  Difficulty in walking, not elsewhere classified  Unsteadiness on feet  Other lack of coordination  Ataxia  Other abnormalities of gait and mobility  Rationale for Evaluation and Treatment: Rehabilitation  SUBJECTIVE:                                                                                                                                                                                             SUBJECTIVE STATEMENT: Dog bite healing; no redness or draining noted; no pain and no new issues reported  Eval:Patient treated here earlier this year; continues with cerebellar ataxia; now using a rollator; reports no pain.  Issues with balance and leg weakness; cerebellar ataxia.  Has a 77 yo son ; married; symptoms started 5 years ago; gradually worsening; wife is a Engineer, civil (consulting); has had some PT in [place] but is too far too go.   Grandmother drove him here today. Pt accompanied by: self   Pt accompanied by: family member  PERTINENT HISTORY:   PAIN:  Are you having pain? No  PRECAUTIONS: Fall  RED FLAGS: None   WEIGHT BEARING RESTRICTIONS: No  FALLS: Has patient fallen in last 6 months? No  LIVING ENVIRONMENT: Lives with: lives with their family and lives with their spouse Lives in: House/apartment Stairs: Yes: External: 2 steps; none Has following equipment at home: Dan Humphreys -  4 wheeled     PLOF: Independent  PATIENT GOALS: get stronger; get off rollator  OBJECTIVE:  Note: Objective measures were completed at Evaluation unless otherwise noted.  DIAGNOSTIC FINDINGS:   COGNITION: Overall cognitive status: Within functional limits for tasks assessed   SENSATION: WFL  COORDINATION: Impaired finger to nose     POSTURE: rounded shoulders and forward head  LOWER EXTREMITY ROM:     Active  Right Eval Left Eval  Hip flexion    Hip extension    Hip abduction    Hip adduction    Hip internal rotation    Hip external rotation    Knee flexion    Knee extension    Ankle dorsiflexion    Ankle plantarflexion    Ankle inversion    Ankle eversion     (Blank rows = not tested)  LOWER EXTREMITY MMT:    MMT Right Eval Right 04/19/23 Left Eval Left 04/19/23  Hip flexion 4- 5 4- 5  Hip extension      Hip abduction      Hip adduction      Hip internal rotation      Hip external rotation      Knee flexion      Knee extension      Ankle dorsiflexion      Ankle plantarflexion      Ankle inversion      Ankle eversion      (Blank rows = not tested)  BED MOBILITY:  Not tested at eval  TRANSFERS: Assistive device utilized: Environmental consultant - 4 wheeled  Sit to stand: SBA Stand to sit: SBA Chair to chair:  not tested Floor:  not tested   STAIRS: Level of Assistance:  Psychologist, counselling Technique:  with  Number of Stairs:   Height of Stairs:   Comments:   GAIT: Gait pattern: decreased  step length- Right, decreased step length- Left, decreased hip/knee flexion- Right, decreased hip/knee flexion- Left, and shuffling Distance walked: 50 ft in clinic Assistive device utilized: Walker - 4 wheeled Level of assistance: SBA Comments: occasionally impulsive, sometimes gets too fast for safe ambulation   FUNCTIONAL TESTS:  5 times sit to stand: 45.64 using hands and rollator to assist up to standing; legs push against back of chair 2 minute walk test: 112 ft with rollator SLS unable  PATIENT SURVEYS:  ABC scale 68.8%                                                                                                                              TREATMENT DATE:  05/09/23 Nustep seat 11 x 5' dynamic warm up level 2 Standing: with gait belt and in // bars Marching/high knees x 10 each 2# sidestepping in // bars down and back x 6 2# Small hurdles step over down and back x 3 Standing without UE assist 3 x 30" Sit to stand x 10 using hands to push up to standing (  cues to try to minimize use of hands) Slight stagger stance x 30" each   05/04/23 Standing:// bars Heel toe raises x 15 Marching x 15 Toe tapping different color cones right leg, left leg and then alternating legs Sit to stand using hands to assist up to standing 2 x 5 trying to stand for 5" unsupported between reps Sidestepping // bars down and back x 5 with CGA  04/19/23 Progress note 2 MWT 185 ft with 3 wheeled rollator 5 x sit to stand 47.02 using hands to assist up ABC scale 100% Hip F strength 5/5 SLS unable without 1 HHA; 1 HHA x 10" each leg Dgi 9/24 DGI 1. Gait level surface (1) Moderate Impairment: Walks 20', slow speed, abnormal gait pattern, evidence for imbalance. 2. Change in gait speed (1) Moderate Impairment: Makes only minor adjustments to walking speed, or accomplishes a change in speed with significant gait deviations, or changes speed but has significant gait deviations, or changes speed but  loses balance but is able to recover and continue walking. 3. Gait with horizontal head turns (1) Moderate Impairment: Performs head turns with moderate change in gait velocity, slows down, staggers but recovers, can continue to walk. 4. Gait with vertical head turns 1) Moderate Impairment: Performs head turns with moderate change in gait velocity, slows down, staggers but recovers, can continue to walk. 5. Gait and pivot turn (2) Mild Impairment: Pivot turns safely in > 3 seconds and stops with no loss of balance. 6. Step over obstacle (0) Severe Impairment: Cannot perform without assistance. 7. Step around obstacles (1) Moderate Impairment: Is able to clear cones but must significantly slow, speed to accomplish task, or requires verbal cueing. 8. Stairs (2) Mild Impairment: Alternating feet, must use rail.  TOTAL SCORE: 9 / 24  Updated HEP       PATIENT EDUCATION: Education details: Patient educated on exam findings, POC, scope of PT, HEP, and what to expect next visit. Person educated: Patient Education method: Explanation, Demonstration, and Handouts Education comprehension: verbalized understanding, returned demonstration, verbal cues required, and tactile cues required   HOME EXERCISE PROGRAM: Access Code: [email or web address] URL: [email or web address] Date: 04/19/2023 Prepared by: [facility] - Rehab  Exercises - Sit to Stand with Counter Support  - 2 x daily - 7 x weekly - 1 sets - 15 reps - Standing March with Counter Support  - 2 x daily - 7 x weekly - 1 sets - 15 reps - Heel Raises with Counter Support  - 1 x daily - 7 x weekly - 1 sets - 15 reps - Standing Hip Abduction with Counter Support  - 1 x daily - 7 x weekly - 1 sets - 15 reps - Standing Single Leg Stance with Counter Support  - 1 x daily - 7 x weekly - 1 sets - 10 reps Access Code: [email or web address] URL: [email or web address] Date: 03/23/2023 Prepared by: [facility] - Rehab  Exercises - Sit to Stand with  Counter Support  - 2 x daily - 7 x weekly - 1 sets - 10 reps - Standing March with Counter Support  - 2 x daily - 7 x weekly - 1 sets - 10 reps  GOALS: Goals reviewed with patient? No  SHORT TERM GOALS: Target date: 04/06/2023  patient will be independent with initial HEP  Baseline: Goal status: met  2.  Patient will self report 25% improvement to improve tolerance for functional activity  Baseline:  Goal status: in progress  LONG TERM GOALS: Target date: 04/20/2023  Patient will be independent in self management strategies to improve quality of life and functional outcomes.  Baseline:  Goal status: in progress  2.  Patient will self report 50% improvement to improve tolerance for functional activity  Baseline:  Goal status: in progress  3.  Patient will be able to stand SLS x 4" each leg to demonstrate improved functional balance.   Baseline: unable 04/19/23 unless has one HHA Goal status: in progress  4.  Patient will increase his 2 MWT distance to 220 ft to demonstrate improved functional mobility Baseline: 112 ft with rollator; 185 ft with rollator 04/19/23 Goal status: in progress  5.  Patient will improve 5 times sit to stand score from 54.64 sec to 40  sec to demonstrate improved functional mobility and increased lower extremity strength.   Baseline: 47.02 sec using hands to assist Goal status: in progress  ASSESSMENT:  CLINICAL IMPRESSION: Continue with focus on standing balance and functional movement and strengthening.  Patient able to stand unsupported 3 x 30" each rep today demonstrating good improvement with balance as he was unable to stand more than 5 sec a few visits ago.  Patient is still heavily reliant on upper extremities with sit to stand and all transitions.  Patient will benefit from continued skilled therapy services to address deficits and promote return to optimal function and to address remaining unmet and partially met goals.       OBJECTIVE  IMPAIRMENTS: Abnormal gait, decreased activity tolerance, decreased balance, decreased coordination, decreased mobility, difficulty walking, decreased strength, decreased safety awareness, and impaired perceived functional ability.   ACTIVITY LIMITATIONS: carrying, lifting, bending, standing, squatting, stairs, transfers, bathing, toileting, dressing, locomotion level, and caring for others  PARTICIPATION LIMITATIONS: meal prep, cleaning, laundry, medication management, driving, shopping, community activity, occupation, and yard work  [facility] POTENTIAL: Good  CLINICAL DECISION MAKING: Evolving/moderate complexity  EVALUATION COMPLEXITY: Moderate  PLAN:  PT FREQUENCY: 2x/week  PT DURATION: 4 weeks  PLANNED INTERVENTIONS: 97164- PT Re-evaluation, 97110-Therapeutic exercises, 97530- Therapeutic activity, [id number]- Neuromuscular re-education, 97535- Self Care, [id number]- Manual therapy, [id number]- Gait training, [phone]- Orthotic Fit/training, [phone]- Canalith repositioning, [id number]- Aquatic Therapy, [phone]- Splinting, Patient/Family education, Balance training, Stair training, Taping, Dry Needling, Joint mobilization, Joint manipulation, Spinal manipulation, Spinal mobilization, Scar mobilization, and DME instructions.   PLAN FOR NEXT SESSION:  balance and lower extremity strengthening, functional movement and strengthening.    11:44 AM, 05/09/23 Samuel Stanley MPT [place] [facility] [place] [phone]

## 2023-05-11 ENCOUNTER — Ambulatory Visit (HOSPITAL_COMMUNITY): Payer: Medicare (Managed Care)

## 2023-05-11 DIAGNOSIS — G119 Hereditary ataxia, unspecified: Secondary | ICD-10-CM

## 2023-05-11 DIAGNOSIS — M6281 Muscle weakness (generalized): Secondary | ICD-10-CM

## 2023-05-11 DIAGNOSIS — R278 Other lack of coordination: Secondary | ICD-10-CM

## 2023-05-11 DIAGNOSIS — R2689 Other abnormalities of gait and mobility: Secondary | ICD-10-CM

## 2023-05-11 DIAGNOSIS — R262 Difficulty in walking, not elsewhere classified: Secondary | ICD-10-CM

## 2023-05-11 DIAGNOSIS — R2681 Unsteadiness on feet: Secondary | ICD-10-CM

## 2023-05-11 NOTE — Therapy (Signed)
 OUTPATIENT PHYSICAL THERAPY NEURO TREATMENT     Patient Name: Samuel Stanley MRN: [id number] DOB:09-Jun-1990, 33 y.o., male Today's Date: 05/11/2023   PCP: Donzetta Sprung, MD REFERRING PROVIDER: [name], MD  END OF SESSION:  PT End of Session - 05/11/23 1232     Visit Number 11    Date for PT Re-Evaluation 05/19/23    Authorization Type [facility] [employer] PPO    PT Start Time 1155   late almost 10 minutes today   PT Stop Time 1230    PT Time Calculation (min) 35 min    Activity Tolerance Patient tolerated treatment well    Behavior During Therapy [facility] for tasks assessed/performed                Past Medical History:  Diagnosis Date   Depression    Hypertension    Spinocerebellar ataxia type 17 (HCC)    Past Surgical History:  Procedure Laterality Date   HERNIA REPAIR     testicular torsion     Patient Active Problem List   Diagnosis Date Noted   Anterior dislocation of right shoulder 03/01/2016    ONSET DATE: about 5 years ago  REFERRING DIAG: G11.9 (ICD-10-CM) - Cerebellar ataxia (HCC)  THERAPY DIAG:  Cerebellar ataxia (HCC)  Difficulty in walking, not elsewhere classified  Unsteadiness on feet  Other abnormalities of gait and mobility  Other lack of coordination  Muscle weakness (generalized)  Rationale for Evaluation and Treatment: Rehabilitation  SUBJECTIVE:                                                                                                                                                                                             SUBJECTIVE STATEMENT: 05/11/23: Dog bite has healed. Doing well today.  Eval:Patient treated here earlier this year; continues with cerebellar ataxia; now using a rollator; reports no pain.  Issues with balance and leg weakness; cerebellar ataxia.  Has a 38 yo son ; married; symptoms started 5 years ago; gradually worsening; wife is a Engineer, civil (consulting); has had some PT in [place] but is too far too go.   Grandmother drove him here today. Pt accompanied by: self   Pt accompanied by: family member  PERTINENT HISTORY:   PAIN:  Are you having pain? No  PRECAUTIONS: Fall  RED FLAGS: None   WEIGHT BEARING RESTRICTIONS: No  FALLS: Has patient fallen in last 6 months? No  LIVING ENVIRONMENT: Lives with: lives with their family and lives with their spouse Lives in: House/apartment Stairs: Yes: External: 2 steps; none Has following equipment at home: Dan Humphreys - 4 wheeled  PLOF: Independent  PATIENT GOALS: get stronger; get off rollator  OBJECTIVE:  Note: Objective measures were completed at Evaluation unless otherwise noted.  DIAGNOSTIC FINDINGS:   COGNITION: Overall cognitive status: Within functional limits for tasks assessed   SENSATION: WFL  COORDINATION: Impaired finger to nose     POSTURE: rounded shoulders and forward head  LOWER EXTREMITY ROM:     Active  Right Eval Left Eval  Hip flexion    Hip extension    Hip abduction    Hip adduction    Hip internal rotation    Hip external rotation    Knee flexion    Knee extension    Ankle dorsiflexion    Ankle plantarflexion    Ankle inversion    Ankle eversion     (Blank rows = not tested)  LOWER EXTREMITY MMT:    MMT Right Eval Right 04/19/23 Left Eval Left 04/19/23  Hip flexion 4- 5 4- 5  Hip extension      Hip abduction      Hip adduction      Hip internal rotation      Hip external rotation      Knee flexion      Knee extension      Ankle dorsiflexion      Ankle plantarflexion      Ankle inversion      Ankle eversion      (Blank rows = not tested)  BED MOBILITY:  Not tested at eval  TRANSFERS: Assistive device utilized: Environmental consultant - 4 wheeled  Sit to stand: SBA Stand to sit: SBA Chair to chair:  not tested Floor:  not tested   STAIRS: Level of Assistance:  Psychologist, counselling Technique:  with  Number of Stairs:   Height of Stairs:   Comments:   GAIT: Gait pattern: decreased  step length- Right, decreased step length- Left, decreased hip/knee flexion- Right, decreased hip/knee flexion- Left, and shuffling Distance walked: 50 ft in clinic Assistive device utilized: Walker - 4 wheeled Level of assistance: SBA Comments: occasionally impulsive, sometimes gets too fast for safe ambulation   FUNCTIONAL TESTS:  5 times sit to stand: 45.64 using hands and rollator to assist up to standing; legs push against back of chair 2 minute walk test: 112 ft with rollator SLS unable  PATIENT SURVEYS:  ABC scale 68.8%                                                                                                                              TREATMENT DATE:  05/11/23 Nustep seat 11, arm 9, x 5' dynamic warm up level 3 Forward walking on a floor ladder inside // bars x 5 rounds to facilitate narrow BOS Seated alt toe raises x 10 x 2 with tactile cues to improve coordination Standing alt heel taps on an orange cone x 10  05/09/23 Nustep seat 11 x 5' dynamic warm up level 2 Standing: with gait belt  and in // bars Marching/high knees x 10 each 2# sidestepping in // bars down and back x 6 2# Small hurdles step over down and back x 3 Standing without UE assist 3 x 30" Sit to stand x 10 using hands to push up to standing (cues to try to minimize use of hands) Slight stagger stance x 30" each   05/04/23 Standing:// bars Heel toe raises x 15 Marching x 15 Toe tapping different color cones right leg, left leg and then alternating legs Sit to stand using hands to assist up to standing 2 x 5 trying to stand for 5" unsupported between reps Sidestepping // bars down and back x 5 with CGA  04/19/23 Progress note 2 MWT 185 ft with 3 wheeled rollator 5 x sit to stand 47.02 using hands to assist up ABC scale 100% Hip F strength 5/5 SLS unable without 1 HHA; 1 HHA x 10" each leg Dgi 9/24 DGI 1. Gait level surface (1) Moderate Impairment: Walks 20', slow speed, abnormal gait  pattern, evidence for imbalance. 2. Change in gait speed (1) Moderate Impairment: Makes only minor adjustments to walking speed, or accomplishes a change in speed with significant gait deviations, or changes speed but has significant gait deviations, or changes speed but loses balance but is able to recover and continue walking. 3. Gait with horizontal head turns (1) Moderate Impairment: Performs head turns with moderate change in gait velocity, slows down, staggers but recovers, can continue to walk. 4. Gait with vertical head turns 1) Moderate Impairment: Performs head turns with moderate change in gait velocity, slows down, staggers but recovers, can continue to walk. 5. Gait and pivot turn (2) Mild Impairment: Pivot turns safely in > 3 seconds and stops with no loss of balance. 6. Step over obstacle (0) Severe Impairment: Cannot perform without assistance. 7. Step around obstacles (1) Moderate Impairment: Is able to clear cones but must significantly slow, speed to accomplish task, or requires verbal cueing. 8. Stairs (2) Mild Impairment: Alternating feet, must use rail.  TOTAL SCORE: 9 / 24  Updated HEP       PATIENT EDUCATION: Education details: Patient educated on exam findings, POC, scope of PT, HEP, and what to expect next visit. Person educated: Patient Education method: Explanation, Demonstration, and Handouts Education comprehension: verbalized understanding, returned demonstration, verbal cues required, and tactile cues required   HOME EXERCISE PROGRAM: Access Code: [email or web address] URL: [email or web address] Date: 04/19/2023 Prepared by: AP - Rehab  Exercises - Sit to Stand with Counter Support  - 2 x daily - 7 x weekly - 1 sets - 15 reps - Standing March with Counter Support  - 2 x daily - 7 x weekly - 1 sets - 15 reps - Heel Raises with Counter Support  - 1 x daily - 7 x weekly - 1 sets - 15 reps - Standing Hip Abduction with Counter Support  - 1 x  daily - 7 x weekly - 1 sets - 15 reps - Standing Single Leg Stance with Counter Support  - 1 x daily - 7 x weekly - 1 sets - 10 reps Access Code: [email or web address] URL: [email or web address] Date: 03/23/2023 Prepared by: AP - Rehab  Exercises - Sit to Stand with Counter Support  - 2 x daily - 7 x weekly - 1 sets - 10 reps - Standing March with Counter Support  - 2 x daily - 7 x weekly - 1 sets - 10 reps  GOALS: Goals reviewed with patient? No  SHORT TERM GOALS: Target date: 04/06/2023  patient will be independent with initial HEP  Baseline: Goal status: met  2.  Patient will self report 25% improvement to improve tolerance for functional activity  Baseline:  Goal status: in progress  LONG TERM GOALS: Target date: 04/20/2023  Patient will be independent in self management strategies to improve quality of life and functional outcomes.  Baseline:  Goal status: in progress  2.  Patient will self report 50% improvement to improve tolerance for functional activity  Baseline:  Goal status: in progress  3.  Patient will be able to stand SLS x 4" each leg to demonstrate improved functional balance.   Baseline: unable 04/19/23 unless has one HHA Goal status: in progress  4.  Patient will increase his 2 MWT distance to 220 ft to demonstrate improved functional mobility Baseline: 112 ft with rollator; 185 ft with rollator 04/19/23 Goal status: in progress  5.  Patient will improve 5 times sit to stand score from 54.64 sec to 40  sec to demonstrate improved functional mobility and increased lower extremity strength.   Baseline: 47.02 sec using hands to assist Goal status: in progress  ASSESSMENT:  CLINICAL IMPRESSION: Interventions today were geared towards foot coordination and LE strengthening. Demonstrated appropriate levels of fatigue. Provided mild to mod amount of cueing to ensure correct execution of activity with poor to fair carry-over especially on alt toe raises  due to incoordination. Noticed excessive hip flexion and slight toe inversion on forward walking. Pacing of activities was slightly slow. To date, skilled PT is required to address the impairments and improve function.    OBJECTIVE IMPAIRMENTS: Abnormal gait, decreased activity tolerance, decreased balance, decreased coordination, decreased mobility, difficulty walking, decreased strength, decreased safety awareness, and impaired perceived functional ability.   ACTIVITY LIMITATIONS: carrying, lifting, bending, standing, squatting, stairs, transfers, bathing, toileting, dressing, locomotion level, and caring for others  PARTICIPATION LIMITATIONS: meal prep, cleaning, laundry, medication management, driving, shopping, community activity, occupation, and yard work  [facility] POTENTIAL: Good  CLINICAL DECISION MAKING: Evolving/moderate complexity  EVALUATION COMPLEXITY: Moderate  PLAN:  PT FREQUENCY: 2x/week  PT DURATION: 4 weeks  PLANNED INTERVENTIONS: 97164- PT Re-evaluation, 97110-Therapeutic exercises, 97530- Therapeutic activity, [id number]- Neuromuscular re-education, 97535- Self Care, 16109- Manual therapy, [id number]- Gait training, [phone]- Orthotic Fit/training, [phone]- Canalith repositioning, [id number]- Aquatic Therapy, [phone]- Splinting, Patient/Family education, Balance training, Stair training, Taping, Dry Needling, Joint mobilization, Joint manipulation, Spinal manipulation, Spinal mobilization, Scar mobilization, and DME instructions.   PLAN FOR NEXT SESSION:  balance and lower extremity strengthening, functional movement and strengthening.   Tish Frederickson. Levita Monical, PT, DPT, OCS Board-Certified Clinical Specialist in Orthopedic PT PT Compact Privilege # ([place]): [phone] T 12:34 PM, 05/11/23

## 2023-05-15 ENCOUNTER — Ambulatory Visit (HOSPITAL_COMMUNITY): Payer: Medicare (Managed Care) | Attending: Family Medicine

## 2023-05-15 DIAGNOSIS — G119 Hereditary ataxia, unspecified: Secondary | ICD-10-CM | POA: Insufficient documentation

## 2023-05-15 DIAGNOSIS — R2689 Other abnormalities of gait and mobility: Secondary | ICD-10-CM | POA: Diagnosis not present

## 2023-05-15 DIAGNOSIS — R262 Difficulty in walking, not elsewhere classified: Secondary | ICD-10-CM | POA: Insufficient documentation

## 2023-05-15 DIAGNOSIS — R278 Other lack of coordination: Secondary | ICD-10-CM | POA: Insufficient documentation

## 2023-05-15 DIAGNOSIS — R270 Ataxia, unspecified: Secondary | ICD-10-CM | POA: Diagnosis not present

## 2023-05-15 DIAGNOSIS — M6281 Muscle weakness (generalized): Secondary | ICD-10-CM | POA: Diagnosis not present

## 2023-05-15 DIAGNOSIS — R2681 Unsteadiness on feet: Secondary | ICD-10-CM | POA: Insufficient documentation

## 2023-05-15 NOTE — Therapy (Signed)
 OUTPATIENT PHYSICAL THERAPY NEURO TREATMENT     Patient Name: Samuel Stanley MRN: [id number] DOB:13-Jan-1991, 33 y.o., male Today's Date: 05/15/2023   PCP: Donzetta Sprung, MD REFERRING PROVIDER: [name], MD  END OF SESSION:  PT End of Session - 05/15/23 1027     Visit Number 13    Number of Visits 16    Date for PT Re-Evaluation 05/19/23    Authorization Type [place] [employer] PPO    PT Start Time 1020    PT Stop Time 1100    PT Time Calculation (min) 40 min    Activity Tolerance Patient tolerated treatment well    Behavior During Therapy [facility] for tasks assessed/performed                Past Medical History:  Diagnosis Date   Depression    Hypertension    Spinocerebellar ataxia type 17 (HCC)    Past Surgical History:  Procedure Laterality Date   HERNIA REPAIR     testicular torsion     Patient Active Problem List   Diagnosis Date Noted   Anterior dislocation of right shoulder 03/01/2016    ONSET DATE: about 5 years ago  REFERRING DIAG: G11.9 (ICD-10-CM) - Cerebellar ataxia (HCC)  THERAPY DIAG:  Cerebellar ataxia (HCC)  Difficulty in walking, not elsewhere classified  Unsteadiness on feet  Other abnormalities of gait and mobility  Other lack of coordination  Muscle weakness (generalized)  Ataxia  Rationale for Evaluation and Treatment: Rehabilitation  SUBJECTIVE:                                                                                                                                                                                             SUBJECTIVE STATEMENT: Patient states he has some decreased appetite; no new falls reported; no pain compliant  Eval:Patient treated here earlier this year; continues with cerebellar ataxia; now using a rollator; reports no pain.  Issues with balance and leg weakness; cerebellar ataxia.  Has a 38 yo son ; married; symptoms started 5 years ago; gradually worsening; wife is a Engineer, civil (consulting); has had some PT  in [place] but is too far too go.  Grandmother drove him here today. Pt accompanied by: self   Pt accompanied by: family member  PERTINENT HISTORY:   PAIN:  Are you having pain? No  PRECAUTIONS: Fall  RED FLAGS: None   WEIGHT BEARING RESTRICTIONS: No  FALLS: Has patient fallen in last 6 months? No  LIVING ENVIRONMENT: Lives with: lives with their family and lives with their spouse Lives in: House/apartment Stairs: Yes: External: 2 steps; none Has following equipment  at home: Dan Humphreys - 4 wheeled     PLOF: Independent  PATIENT GOALS: get stronger; get off rollator  OBJECTIVE:  Note: Objective measures were completed at Evaluation unless otherwise noted.  DIAGNOSTIC FINDINGS:   COGNITION: Overall cognitive status: Within functional limits for tasks assessed   SENSATION: WFL  COORDINATION: Impaired finger to nose     POSTURE: rounded shoulders and forward head  LOWER EXTREMITY ROM:     Active  Right Eval Left Eval  Hip flexion    Hip extension    Hip abduction    Hip adduction    Hip internal rotation    Hip external rotation    Knee flexion    Knee extension    Ankle dorsiflexion    Ankle plantarflexion    Ankle inversion    Ankle eversion     (Blank rows = not tested)  LOWER EXTREMITY MMT:    MMT Right Eval Right 04/19/23 Left Eval Left 04/19/23  Hip flexion 4- 5 4- 5  Hip extension      Hip abduction      Hip adduction      Hip internal rotation      Hip external rotation      Knee flexion      Knee extension      Ankle dorsiflexion      Ankle plantarflexion      Ankle inversion      Ankle eversion      (Blank rows = not tested)  BED MOBILITY:  Not tested at eval  TRANSFERS: Assistive device utilized: Environmental consultant - 4 wheeled  Sit to stand: SBA Stand to sit: SBA Chair to chair:  not tested Floor:  not tested   STAIRS: Level of Assistance:  Psychologist, counselling Technique:  with  Number of Stairs:   Height of Stairs:    Comments:   GAIT: Gait pattern: decreased step length- Right, decreased step length- Left, decreased hip/knee flexion- Right, decreased hip/knee flexion- Left, and shuffling Distance walked: 50 ft in clinic Assistive device utilized: Walker - 4 wheeled Level of assistance: SBA Comments: occasionally impulsive, sometimes gets too fast for safe ambulation   FUNCTIONAL TESTS:  5 times sit to stand: 45.64 using hands and rollator to assist up to standing; legs push against back of chair 2 minute walk test: 112 ft with rollator SLS unable  PATIENT SURVEYS:  ABC scale 68.8%                                                                                                                              TREATMENT DATE:  05/15/23 // bars with gait belt Standing high knees; tried to use one hand; needs intermittent use of both Standing without UE assist in // bars 3 x 30" Side stepping down and back // bars  x 5 Stepping over small hurdles in // bars down and back x 3 Stepping on colored discs varying distance and pattern  down and back x 3   05/11/23 Nustep seat 11, arm 9, x 5' dynamic warm up level 3 Forward walking on a floor ladder inside // bars x 5 rounds to facilitate narrow BOS Seated alt toe raises x 10 x 2 with tactile cues to improve coordination Standing alt heel taps on an orange cone x 10  05/09/23 Nustep seat 11 x 5' dynamic warm up level 2 Standing: with gait belt and in // bars Marching/high knees x 10 each 2# sidestepping in // bars down and back x 6 2# Small hurdles step over down and back x 3 Standing without UE assist 3 x 30" Sit to stand x 10 using hands to push up to standing (cues to try to minimize use of hands) Slight stagger stance x 30" each   05/04/23 Standing:// bars Heel toe raises x 15 Marching x 15 Toe tapping different color cones right leg, left leg and then alternating legs Sit to stand using hands to assist up to standing 2 x 5 trying to stand for  5" unsupported between reps Sidestepping // bars down and back x 5 with CGA  04/19/23 Progress note 2 MWT 185 ft with 3 wheeled rollator 5 x sit to stand 47.02 using hands to assist up ABC scale 100% Hip F strength 5/5 SLS unable without 1 HHA; 1 HHA x 10" each leg Dgi 9/24 DGI 1. Gait level surface (1) Moderate Impairment: Walks 20', slow speed, abnormal gait pattern, evidence for imbalance. 2. Change in gait speed (1) Moderate Impairment: Makes only minor adjustments to walking speed, or accomplishes a change in speed with significant gait deviations, or changes speed but has significant gait deviations, or changes speed but loses balance but is able to recover and continue walking. 3. Gait with horizontal head turns (1) Moderate Impairment: Performs head turns with moderate change in gait velocity, slows down, staggers but recovers, can continue to walk. 4. Gait with vertical head turns 1) Moderate Impairment: Performs head turns with moderate change in gait velocity, slows down, staggers but recovers, can continue to walk. 5. Gait and pivot turn (2) Mild Impairment: Pivot turns safely in > 3 seconds and stops with no loss of balance. 6. Step over obstacle (0) Severe Impairment: Cannot perform without assistance. 7. Step around obstacles (1) Moderate Impairment: Is able to clear cones but must significantly slow, speed to accomplish task, or requires verbal cueing. 8. Stairs (2) Mild Impairment: Alternating feet, must use rail.  TOTAL SCORE: 9 / 24  Updated HEP       PATIENT EDUCATION: Education details: Patient educated on exam findings, POC, scope of PT, HEP, and what to expect next visit. Person educated: Patient Education method: Explanation, Demonstration, and Handouts Education comprehension: verbalized understanding, returned demonstration, verbal cues required, and tactile cues required   HOME EXERCISE PROGRAM: Access Code: [email or web address] URL:  [email or web address] Date: 04/19/2023 Prepared by: [facility] - Rehab  Exercises - Sit to Stand with Counter Support  - 2 x daily - 7 x weekly - 1 sets - 15 reps - Standing March with Counter Support  - 2 x daily - 7 x weekly - 1 sets - 15 reps - Heel Raises with Counter Support  - 1 x daily - 7 x weekly - 1 sets - 15 reps - Standing Hip Abduction with Counter Support  - 1 x daily - 7 x weekly - 1 sets - 15 reps - Standing Single Leg Stance with Counter Support  - 1 x  daily - 7 x weekly - 1 sets - 10 reps Access Code: [email or web address] URL: [email or web address] Date: 03/23/2023 Prepared by: AP - Rehab  Exercises - Sit to Stand with Counter Support  - 2 x daily - 7 x weekly - 1 sets - 10 reps - Standing March with Counter Support  - 2 x daily - 7 x weekly - 1 sets - 10 reps  GOALS: Goals reviewed with patient? No  SHORT TERM GOALS: Target date: 04/06/2023  patient will be independent with initial HEP  Baseline: Goal status: met  2.  Patient will self report 25% improvement to improve tolerance for functional activity  Baseline:  Goal status: in progress  LONG TERM GOALS: Target date: 04/20/2023  Patient will be independent in self management strategies to improve quality of life and functional outcomes.  Baseline:  Goal status: in progress  2.  Patient will self report 50% improvement to improve tolerance for functional activity  Baseline:  Goal status: in progress  3.  Patient will be able to stand SLS x 4" each leg to demonstrate improved functional balance.   Baseline: unable 04/19/23 unless has one HHA Goal status: in progress  4.  Patient will increase his 2 MWT distance to 220 ft to demonstrate improved functional mobility Baseline: 112 ft with rollator; 185 ft with rollator 04/19/23 Goal status: in progress  5.  Patient will improve 5 times sit to stand score from 54.64 sec to 40  sec to demonstrate improved functional mobility and increased lower  extremity strength.   Baseline: 47.02 sec using hands to assist Goal status: in progress  ASSESSMENT:  CLINICAL IMPRESSION: Today's session continued with focus functional movement, balance and strengthening.  Patient continues with impaired coordination.  He often steps onto his own feet especially with sidestepping demonstrating impaired coordination, body awareness and proprioception.  Tries to perform hurdles with left UE only but unable to do so safely.  Has to reach with right hand to support himself and of note he has slow reaction time.  Patient will benefit from continued skilled therapy services to address deficits and promote return to optimal function.       OBJECTIVE IMPAIRMENTS: Abnormal gait, decreased activity tolerance, decreased balance, decreased coordination, decreased mobility, difficulty walking, decreased strength, decreased safety awareness, and impaired perceived functional ability.   ACTIVITY LIMITATIONS: carrying, lifting, bending, standing, squatting, stairs, transfers, bathing, toileting, dressing, locomotion level, and caring for others  PARTICIPATION LIMITATIONS: meal prep, cleaning, laundry, medication management, driving, shopping, community activity, occupation, and yard work  [facility] POTENTIAL: Good  CLINICAL DECISION MAKING: Evolving/moderate complexity  EVALUATION COMPLEXITY: Moderate  PLAN:  PT FREQUENCY: 2x/week  PT DURATION: 4 weeks  PLANNED INTERVENTIONS: 97164- PT Re-evaluation, 97110-Therapeutic exercises, 97530- Therapeutic activity, [id number]- Neuromuscular re-education, 97535- Self Care, 47829- Manual therapy, [id number]- Gait training, [phone]- Orthotic Fit/training, [phone]- Canalith repositioning, [id number]- Aquatic Therapy, [phone]- Splinting, Patient/Family education, Balance training, Stair training, Taping, Dry Needling, Joint mobilization, Joint manipulation, Spinal manipulation, Spinal mobilization, Scar mobilization, and DME instructions.   PLAN FOR  NEXT SESSION:  balance and lower extremity strengthening, functional movement and strengthening.  Reassess next week at 3/11 appt  11:04 AM, 05/15/23 Iviona Hole Small Aelyn Stanaland MPT [place] [facility] [place] [phone]

## 2023-05-17 ENCOUNTER — Encounter (HOSPITAL_COMMUNITY): Payer: Self-pay

## 2023-05-17 ENCOUNTER — Ambulatory Visit (HOSPITAL_COMMUNITY): Payer: Medicare (Managed Care)

## 2023-05-17 DIAGNOSIS — R262 Difficulty in walking, not elsewhere classified: Secondary | ICD-10-CM

## 2023-05-17 DIAGNOSIS — G119 Hereditary ataxia, unspecified: Secondary | ICD-10-CM | POA: Diagnosis not present

## 2023-05-17 DIAGNOSIS — R2689 Other abnormalities of gait and mobility: Secondary | ICD-10-CM

## 2023-05-17 DIAGNOSIS — R2681 Unsteadiness on feet: Secondary | ICD-10-CM

## 2023-05-17 NOTE — Therapy (Signed)
 OUTPATIENT PHYSICAL THERAPY NEURO TREATMENT     Patient Name: Samuel Stanley MRN: [id number] DOB:November 30, 1990, 33 y.o., male Today's Date: 05/17/2023   PCP: Donzetta Sprung, MD REFERRING PROVIDER: [name], MD  END OF SESSION:  PT End of Session - 05/17/23 1149     Visit Number 14    Number of Visits 16    Date for PT Re-Evaluation 05/19/23    Authorization Type [place] [employer]    PT Start Time 1150    PT Stop Time 1230    PT Time Calculation (min) 40 min    Activity Tolerance Patient tolerated treatment well    Behavior During Therapy [facility] for tasks assessed/performed                Past Medical History:  Diagnosis Date   Depression    Hypertension    Spinocerebellar ataxia type 17 (HCC)    Past Surgical History:  Procedure Laterality Date   HERNIA REPAIR     testicular torsion     Patient Active Problem List   Diagnosis Date Noted   Anterior dislocation of right shoulder 03/01/2016    ONSET DATE: about 5 years ago  REFERRING DIAG: G11.9 (ICD-10-CM) - Cerebellar ataxia (HCC)  THERAPY DIAG:  Cerebellar ataxia (HCC)  Difficulty in walking, not elsewhere classified  Unsteadiness on feet  Other abnormalities of gait and mobility  Rationale for Evaluation and Treatment: Rehabilitation  SUBJECTIVE:                                                                                                                                                                                             SUBJECTIVE STATEMENT: No pain or report of recent falls.  Eval:Patient treated here earlier this year; continues with cerebellar ataxia; now using a rollator; reports no pain.  Issues with balance and leg weakness; cerebellar ataxia.  Has a 45 yo son ; married; symptoms started 5 years ago; gradually worsening; wife is a Engineer, civil (consulting); has had some PT in [place] but is too far too go.  Grandmother drove him here today. Pt accompanied by: self   Pt accompanied by: family  member  PERTINENT HISTORY:   PAIN:  Are you having pain? No  PRECAUTIONS: Fall  RED FLAGS: None   WEIGHT BEARING RESTRICTIONS: No  FALLS: Has patient fallen in last 6 months? No  LIVING ENVIRONMENT: Lives with: lives with their family and lives with their spouse Lives in: House/apartment Stairs: Yes: External: 2 steps; none Has following equipment at home: Walker - 4 wheeled     PLOF: Independent  PATIENT GOALS: get stronger; get  off rollator  OBJECTIVE:  Note: Objective measures were completed at Evaluation unless otherwise noted.  DIAGNOSTIC FINDINGS:   COGNITION: Overall cognitive status: Within functional limits for tasks assessed   SENSATION: WFL  COORDINATION: Impaired finger to nose     POSTURE: rounded shoulders and forward head  LOWER EXTREMITY ROM:     Active  Right Eval Left Eval  Hip flexion    Hip extension    Hip abduction    Hip adduction    Hip internal rotation    Hip external rotation    Knee flexion    Knee extension    Ankle dorsiflexion    Ankle plantarflexion    Ankle inversion    Ankle eversion     (Blank rows = not tested)  LOWER EXTREMITY MMT:    MMT Right Eval Right 04/19/23 Left Eval Left 04/19/23  Hip flexion 4- 5 4- 5  Hip extension      Hip abduction      Hip adduction      Hip internal rotation      Hip external rotation      Knee flexion      Knee extension      Ankle dorsiflexion      Ankle plantarflexion      Ankle inversion      Ankle eversion      (Blank rows = not tested)  BED MOBILITY:  Not tested at eval  TRANSFERS: Assistive device utilized: Environmental consultant - 4 wheeled  Sit to stand: SBA Stand to sit: SBA Chair to chair:  not tested Floor:  not tested   STAIRS: Level of Assistance:  Psychologist, counselling Technique:  with  Number of Stairs:   Height of Stairs:   Comments:   GAIT: Gait pattern: decreased step length- Right, decreased step length- Left, decreased hip/knee flexion- Right,  decreased hip/knee flexion- Left, and shuffling Distance walked: 50 ft in clinic Assistive device utilized: Walker - 4 wheeled Level of assistance: SBA Comments: occasionally impulsive, sometimes gets too fast for safe ambulation   FUNCTIONAL TESTS:  5 times sit to stand: 45.64 using hands and rollator to assist up to standing; legs push against back of chair 2 minute walk test: 112 ft with rollator SLS unable  PATIENT SURVEYS:  ABC scale 68.8%                                                                                                                              TREATMENT DATE:  05/17/23:  // bars with gait belt Alternating toe tapping 6 in step  Squat front of chair 10x multimodal cueing for mechanics 6in step up 10x each Agility ladder focus on heel to toe and proper leg length 3RT Side stepping down and back // bars  x 5 Stepping over small hurdles in // bars down and back x 3  05/15/23 // bars with gait belt Standing high knees; tried to use one hand;  needs intermittent use of both Standing without UE assist in // bars 3 x 30" Side stepping down and back // bars  x 5 Stepping over small hurdles in // bars down and back x 3 Stepping on colored discs varying distance and pattern down and back x 3   05/11/23 Nustep seat 11, arm 9, x 5' dynamic warm up level 3 Forward walking on a floor ladder inside // bars x 5 rounds to facilitate narrow BOS Seated alt toe raises x 10 x 2 with tactile cues to improve coordination Standing alt heel taps on an orange cone x 10  05/09/23 Nustep seat 11 x 5' dynamic warm up level 2 Standing: with gait belt and in // bars Marching/high knees x 10 each 2# sidestepping in // bars down and back x 6 2# Small hurdles step over down and back x 3 Standing without UE assist 3 x 30" Sit to stand x 10 using hands to push up to standing (cues to try to minimize use of hands) Slight stagger stance x 30" each   05/04/23 Standing:// bars Heel toe  raises x 15 Marching x 15 Toe tapping different color cones right leg, left leg and then alternating legs Sit to stand using hands to assist up to standing 2 x 5 trying to stand for 5" unsupported between reps Sidestepping // bars down and back x 5 with CGA  04/19/23 Progress note 2 MWT 185 ft with 3 wheeled rollator 5 x sit to stand 47.02 using hands to assist up ABC scale 100% Hip F strength 5/5 SLS unable without 1 HHA; 1 HHA x 10" each leg Dgi 9/24 DGI 1. Gait level surface (1) Moderate Impairment: Walks 20', slow speed, abnormal gait pattern, evidence for imbalance. 2. Change in gait speed (1) Moderate Impairment: Makes only minor adjustments to walking speed, or accomplishes a change in speed with significant gait deviations, or changes speed but has significant gait deviations, or changes speed but loses balance but is able to recover and continue walking. 3. Gait with horizontal head turns (1) Moderate Impairment: Performs head turns with moderate change in gait velocity, slows down, staggers but recovers, can continue to walk. 4. Gait with vertical head turns 1) Moderate Impairment: Performs head turns with moderate change in gait velocity, slows down, staggers but recovers, can continue to walk. 5. Gait and pivot turn (2) Mild Impairment: Pivot turns safely in > 3 seconds and stops with no loss of balance. 6. Step over obstacle (0) Severe Impairment: Cannot perform without assistance. 7. Step around obstacles (1) Moderate Impairment: Is able to clear cones but must significantly slow, speed to accomplish task, or requires verbal cueing. 8. Stairs (2) Mild Impairment: Alternating feet, must use rail.  TOTAL SCORE: 9 / 24  Updated HEP       PATIENT EDUCATION: Education details: Patient educated on exam findings, POC, scope of PT, HEP, and what to expect next visit. Person educated: Patient Education method: Explanation, Demonstration, and Handouts Education  comprehension: verbalized understanding, returned demonstration, verbal cues required, and tactile cues required   HOME EXERCISE PROGRAM: Access Code: [email or web address] URL: [email or web address] Date: 04/19/2023 Prepared by: [facility] - Rehab  Exercises - Sit to Stand with Counter Support  - 2 x daily - 7 x weekly - 1 sets - 15 reps - Standing March with Counter Support  - 2 x daily - 7 x weekly - 1 sets - 15 reps - Heel Raises with Counter Support  - 1 x  daily - 7 x weekly - 1 sets - 15 reps - Standing Hip Abduction with Counter Support  - 1 x daily - 7 x weekly - 1 sets - 15 reps - Standing Single Leg Stance with Counter Support  - 1 x daily - 7 x weekly - 1 sets - 10 reps Access Code: [email or web address] URL: [email or web address] Date: 03/23/2023 Prepared by: [facility] - Rehab  Exercises - Sit to Stand with Counter Support  - 2 x daily - 7 x weekly - 1 sets - 10 reps - Standing March with Counter Support  - 2 x daily - 7 x weekly - 1 sets - 10 reps  GOALS: Goals reviewed with patient? No  SHORT TERM GOALS: Target date: 04/06/2023  patient will be independent with initial HEP  Baseline: Goal status: met  2.  Patient will self report 25% improvement to improve tolerance for functional activity  Baseline:  Goal status: in progress  LONG TERM GOALS: Target date: 04/20/2023  Patient will be independent in self management strategies to improve quality of life and functional outcomes.  Baseline:  Goal status: in progress  2.  Patient will self report 50% improvement to improve tolerance for functional activity  Baseline:  Goal status: in progress  3.  Patient will be able to stand SLS x 4" each leg to demonstrate improved functional balance.   Baseline: unable 04/19/23 unless has one HHA Goal status: in progress  4.  Patient will increase his 2 MWT distance to 220 ft to demonstrate improved functional mobility Baseline: 112 ft with rollator; 185 ft with rollator  04/19/23 Goal status: in progress  5.  Patient will improve 5 times sit to stand score from 54.64 sec to 40  sec to demonstrate improved functional mobility and increased lower extremity strength.   Baseline: 47.02 sec using hands to assist Goal status: in progress  ASSESSMENT:  CLINICAL IMPRESSION: Session focus with coordination to improve gait mechanics,  balance and functional strengthening.  Pt continues to demonstrated impaired coordination and need for UE A for safety.  Multimodal cueing to improve step length and heel to toe mechanics, good mechanics with visual feedback with agility ladder.  Minimal fatigue at EOS, no reports of pain.  OBJECTIVE IMPAIRMENTS: Abnormal gait, decreased activity tolerance, decreased balance, decreased coordination, decreased mobility, difficulty walking, decreased strength, decreased safety awareness, and impaired perceived functional ability.   ACTIVITY LIMITATIONS: carrying, lifting, bending, standing, squatting, stairs, transfers, bathing, toileting, dressing, locomotion level, and caring for others  PARTICIPATION LIMITATIONS: meal prep, cleaning, laundry, medication management, driving, shopping, community activity, occupation, and yard work  [facility] POTENTIAL: Good  CLINICAL DECISION MAKING: Evolving/moderate complexity  EVALUATION COMPLEXITY: Moderate  PLAN:  PT FREQUENCY: 2x/week  PT DURATION: 4 weeks  PLANNED INTERVENTIONS: 97164- PT Re-evaluation, 97110-Therapeutic exercises, 97530- Therapeutic activity, [id number]- Neuromuscular re-education, 97535- Self Care, [id number]- Manual therapy, [id number]- Gait training, [phone]- Orthotic Fit/training, [phone]- Canalith repositioning, [id number]- Aquatic Therapy, [phone]- Splinting, Patient/Family education, Balance training, Stair training, Taping, Dry Needling, Joint mobilization, Joint manipulation, Spinal manipulation, Spinal mobilization, Scar mobilization, and DME instructions.   PLAN FOR NEXT SESSION:  balance and  lower extremity strengthening, functional movement and strengthening.  Reassess next week at 3/11 appt  Becky Sax, LPTA/CLT; CBIS [phone]  1:00 PM, 05/17/23

## 2023-05-22 ENCOUNTER — Ambulatory Visit (HOSPITAL_COMMUNITY): Payer: Medicare (Managed Care)

## 2023-05-22 DIAGNOSIS — R2681 Unsteadiness on feet: Secondary | ICD-10-CM

## 2023-05-22 DIAGNOSIS — G119 Hereditary ataxia, unspecified: Secondary | ICD-10-CM

## 2023-05-22 DIAGNOSIS — R262 Difficulty in walking, not elsewhere classified: Secondary | ICD-10-CM

## 2023-05-22 DIAGNOSIS — R278 Other lack of coordination: Secondary | ICD-10-CM

## 2023-05-22 DIAGNOSIS — R2689 Other abnormalities of gait and mobility: Secondary | ICD-10-CM

## 2023-05-22 NOTE — Therapy (Signed)
 OUTPATIENT PHYSICAL THERAPY NEURO TREATMENT/PROGRESS NOTE/DISCHARGE Progress Note Reporting Period 03/23/2023 to 05/22/23  See note below for Objective Data and Assessment of Progress/Goals.  PHYSICAL THERAPY DISCHARGE SUMMARY  Visits from Start of Care: 15  Current functional level related to goals / functional outcomes: See below   Remaining deficits: See below   Education / Equipment: HEP   Patient agrees to discharge. Patient goals were partially met. Patient is being discharged due to being pleased with the current functional level.         Patient Name: Samuel Stanley MRN: [id number] DOB:[birth year], 33 y.o., male Today's Date: 05/22/2023   PCP: Donzetta Sprung, MD REFERRING PROVIDER: [name], MD  END OF SESSION:  PT End of Session - 05/22/23 1028     Visit Number 15    Number of Visits 16    Date for PT Re-Evaluation 05/19/23    Authorization Type [place] [employer]    PT Start Time 1025    PT Stop Time 1100    PT Time Calculation (min) 35 min    Activity Tolerance Patient tolerated treatment well    Behavior During Therapy [facility] for tasks assessed/performed                Past Medical History:  Diagnosis Date   Depression    Hypertension    Spinocerebellar ataxia type 17 (HCC)    Past Surgical History:  Procedure Laterality Date   HERNIA REPAIR     testicular torsion     Patient Active Problem List   Diagnosis Date Noted   Anterior dislocation of right shoulder 03/01/2016    ONSET DATE: about 5 years ago  REFERRING DIAG: G11.9 (ICD-10-CM) - Cerebellar ataxia (HCC)  THERAPY DIAG:  Cerebellar ataxia (HCC)  Difficulty in walking, not elsewhere classified  Unsteadiness on feet  Other abnormalities of gait and mobility  Other lack of coordination  Rationale for Evaluation and Treatment: Rehabilitation  SUBJECTIVE:                                                                                                                                                                                              SUBJECTIVE STATEMENT: No pain or report of recent falls. Feels he is 100% better   Eval:Patient treated here earlier this year; continues with cerebellar ataxia; now using a rollator; reports no pain.  Issues with balance and leg weakness; cerebellar ataxia.  Has a 38 yo son ; married; symptoms started 5 years ago; gradually worsening; wife is a Engineer, civil (consulting); has had some PT in [place] but is too far too go.  Grandmother  drove him here today. Pt accompanied by: self   Pt accompanied by: family member  PERTINENT HISTORY:   PAIN:  Are you having pain? No  PRECAUTIONS: Fall  RED FLAGS: None   WEIGHT BEARING RESTRICTIONS: No  FALLS: Has patient fallen in last 6 months? No  LIVING ENVIRONMENT: Lives with: lives with their family and lives with their spouse Lives in: House/apartment Stairs: Yes: External: 2 steps; none Has following equipment at home: Walker - 4 wheeled     PLOF: Independent  PATIENT GOALS: get stronger; get off rollator  OBJECTIVE:  Note: Objective measures were completed at Evaluation unless otherwise noted.  DIAGNOSTIC FINDINGS:   COGNITION: Overall cognitive status: Within functional limits for tasks assessed   SENSATION: WFL  COORDINATION: Impaired finger to nose     POSTURE: rounded shoulders and forward head  LOWER EXTREMITY ROM:     Active  Right Eval Left Eval  Hip flexion    Hip extension    Hip abduction    Hip adduction    Hip internal rotation    Hip external rotation    Knee flexion    Knee extension    Ankle dorsiflexion    Ankle plantarflexion    Ankle inversion    Ankle eversion     (Blank rows = not tested)  LOWER EXTREMITY MMT:    MMT Right Eval Right 04/19/23 Left Eval Left 04/19/23  Hip flexion 4- 5 4- 5  Hip extension      Hip abduction      Hip adduction      Hip internal rotation      Hip external rotation      Knee flexion       Knee extension      Ankle dorsiflexion      Ankle plantarflexion      Ankle inversion      Ankle eversion      (Blank rows = not tested)  BED MOBILITY:  Not tested at eval  TRANSFERS: Assistive device utilized: Environmental consultant - 4 wheeled  Sit to stand: SBA Stand to sit: SBA Chair to chair:  not tested Floor:  not tested   STAIRS: Level of Assistance:  Psychologist, counselling Technique:  with  Number of Stairs:   Height of Stairs:   Comments:   GAIT: Gait pattern: decreased step length- Right, decreased step length- Left, decreased hip/knee flexion- Right, decreased hip/knee flexion- Left, and shuffling Distance walked: 50 ft in clinic Assistive device utilized: Walker - 4 wheeled Level of assistance: SBA Comments: occasionally impulsive, sometimes gets too fast for safe ambulation   FUNCTIONAL TESTS:  5 times sit to stand: 45.64 using hands and rollator to assist up to standing; legs push against back of chair 2 minute walk test: 112 ft with rollator SLS unable  PATIENT SURVEYS:  ABC scale 68.8%  TREATMENT DATE:  05/22/23 ABC scale 100% 5 times sit to stand using 38.31 sec 2 MWT 215 ft with rollator 3 wheeled walker SLS unable ; able to stand x 30" without UE support Review of goals Updated HEP   05/17/23:  // bars with gait belt Alternating toe tapping 6 in step  Squat front of chair 10x multimodal cueing for mechanics 6in step up 10x each Agility ladder focus on heel to toe and proper leg length 3RT Side stepping down and back // bars  x 5 Stepping over small hurdles in // bars down and back x 3  05/15/23 // bars with gait belt Standing high knees; tried to use one hand; needs intermittent use of both Standing without UE assist in // bars 3 x 30" Side stepping down and back // bars  x 5 Stepping over small hurdles in // bars down and back x  3 Stepping on colored discs varying distance and pattern down and back x 3   05/11/23 Nustep seat 11, arm 9, x 5' dynamic warm up level 3 Forward walking on a floor ladder inside // bars x 5 rounds to facilitate narrow BOS Seated alt toe raises x 10 x 2 with tactile cues to improve coordination Standing alt heel taps on an orange cone x 10  05/09/23 Nustep seat 11 x 5' dynamic warm up level 2 Standing: with gait belt and in // bars Marching/high knees x 10 each 2# sidestepping in // bars down and back x 6 2# Small hurdles step over down and back x 3 Standing without UE assist 3 x 30" Sit to stand x 10 using hands to push up to standing (cues to try to minimize use of hands) Slight stagger stance x 30" each   05/04/23 Standing:// bars Heel toe raises x 15 Marching x 15 Toe tapping different color cones right leg, left leg and then alternating legs Sit to stand using hands to assist up to standing 2 x 5 trying to stand for 5" unsupported between reps Sidestepping // bars down and back x 5 with CGA  04/19/23 Progress note 2 MWT 185 ft with 3 wheeled rollator 5 x sit to stand 47.02 using hands to assist up ABC scale 100% Hip F strength 5/5 SLS unable without 1 HHA; 1 HHA x 10" each leg Dgi 9/24 DGI 1. Gait level surface (1) Moderate Impairment: Walks 20', slow speed, abnormal gait pattern, evidence for imbalance. 2. Change in gait speed (1) Moderate Impairment: Makes only minor adjustments to walking speed, or accomplishes a change in speed with significant gait deviations, or changes speed but has significant gait deviations, or changes speed but loses balance but is able to recover and continue walking. 3. Gait with horizontal head turns (1) Moderate Impairment: Performs head turns with moderate change in gait velocity, slows down, staggers but recovers, can continue to walk. 4. Gait with vertical head turns 1) Moderate Impairment: Performs head turns with moderate change in  gait velocity, slows down, staggers but recovers, can continue to walk. 5. Gait and pivot turn (2) Mild Impairment: Pivot turns safely in > 3 seconds and stops with no loss of balance. 6. Step over obstacle (0) Severe Impairment: Cannot perform without assistance. 7. Step around obstacles (1) Moderate Impairment: Is able to clear cones but must significantly slow, speed to accomplish task, or requires verbal cueing. 8. Stairs (2) Mild Impairment: Alternating feet, must use rail.  TOTAL SCORE: 9 / 24  Updated HEP  PATIENT EDUCATION: Education details: Patient educated on exam findings, POC, scope of PT, HEP, and what to expect next visit. Person educated: Patient Education method: Explanation, Demonstration, and Handouts Education comprehension: verbalized understanding, returned demonstration, verbal cues required, and tactile cues required   HOME EXERCISE PROGRAM: Access Code: [email or web address] URL: [email or web address] Date: 04/19/2023 Prepared by: AP - Rehab  Exercises - Sit to Stand with Counter Support  - 2 x daily - 7 x weekly - 1 sets - 15 reps - Standing March with Counter Support  - 2 x daily - 7 x weekly - 1 sets - 15 reps - Heel Raises with Counter Support  - 1 x daily - 7 x weekly - 1 sets - 15 reps - Standing Hip Abduction with Counter Support  - 1 x daily - 7 x weekly - 1 sets - 15 reps - Standing Single Leg Stance with Counter Support  - 1 x daily - 7 x weekly - 1 sets - 10 reps Access Code: [email or web address] URL: [email or web address] Date: 03/23/2023 Prepared by: AP - Rehab  Exercises - Sit to Stand with Counter Support  - 2 x daily - 7 x weekly - 1 sets - 10 reps - Standing March with Counter Support  - 2 x daily - 7 x weekly - 1 sets - 10 reps  GOALS: Goals reviewed with patient? No  SHORT TERM GOALS: Target date: 04/06/2023  patient will be independent with initial HEP  Baseline: Goal status: met  2.  Patient will self  report 25% improvement to improve tolerance for functional activity  Baseline:  Goal status:met  LONG TERM GOALS: Target date: 04/20/2023  Patient will be independent in self management strategies to improve quality of life and functional outcomes.  Baseline:  Goal status: met with wife's assistance  2.  Patient will self report 50% improvement to improve tolerance for functional activity  Baseline:  Goal status: met  3. Modified 05/22/23  Patient will be able to stand without hand support x 1 min to perform grooming tasks safely (eval goal: Patient will be able to stand SLS x 4" each leg to demonstrate improved functional balance.)   Baseline: unable 04/19/23 unless has one HHA; modified goal to standing without upper extermity support Goal status: in progress  4.  Patient will increase his 2 MWT distance to 220 ft to demonstrate improved functional mobility Baseline: 112 ft with rollator; 185 ft with rollator 04/19/23; 05/22/23 215 ft Goal status: in progress  5.  Patient will improve 5 times sit to stand score from 54.64 sec to 40  sec to demonstrate improved functional mobility and increased lower extremity strength.   Baseline: 47.02 sec using hands to assist; 38.31 sec using hands to assist Goal status: met  ASSESSMENT:  CLINICAL IMPRESSION: Progress note today.  Met goals for sit to stand test and made good improvement with 2 MWT missing goal mark by 5 ft.  Still score of 100% on ABC scale reflecting patient unrealistic picture of his functional balance as he needs AD and hand held assist for safety.  Will modify SLS goal as patient likely not to achieve.  Patient states he is ready for discharge and is agreeable to continued with HEP with his wife's assistance.  Patient has met 5/7 rehab goals.   OBJECTIVE IMPAIRMENTS: Abnormal gait, decreased activity tolerance, decreased balance, decreased coordination, decreased mobility, difficulty walking, decreased strength, decreased  safety awareness, and impaired perceived functional ability.   ACTIVITY LIMITATIONS: carrying, lifting, bending, standing,  squatting, stairs, transfers, bathing, toileting, dressing, locomotion level, and caring for others  PARTICIPATION LIMITATIONS: meal prep, cleaning, laundry, medication management, driving, shopping, community activity, occupation, and yard work  [facility] POTENTIAL: Good  CLINICAL DECISION MAKING: Evolving/moderate complexity  EVALUATION COMPLEXITY: Moderate  PLAN:  PT FREQUENCY: 2x/week  PT DURATION: 4 weeks  PLANNED INTERVENTIONS: 97164- PT Re-evaluation, 97110-Therapeutic exercises, 97530- Therapeutic activity, [id number]- Neuromuscular re-education, 97535- Self Care, 16109- Manual therapy, [id number]- Gait training, [phone]- Orthotic Fit/training, [phone]- Canalith repositioning, [id number]- Aquatic Therapy, [phone]- Splinting, Patient/Family education, Balance training, Stair training, Taping, Dry Needling, Joint mobilization, Joint manipulation, Spinal manipulation, Spinal mobilization, Scar mobilization, and DME instructions.   PLAN FOR NEXT SESSION: discharge 11:07 AM, 05/22/23 [place] Rasor MPT [place] [facility] [phone]

## 2023-05-23 ENCOUNTER — Ambulatory Visit (HOSPITAL_COMMUNITY): Payer: Medicare (Managed Care) | Admitting: Clinical

## 2023-05-24 ENCOUNTER — Ambulatory Visit (HOSPITAL_COMMUNITY): Payer: Medicare (Managed Care)

## 2023-06-12 ENCOUNTER — Ambulatory Visit (INDEPENDENT_AMBULATORY_CARE_PROVIDER_SITE_OTHER): Payer: Medicare (Managed Care) | Admitting: Clinical

## 2023-06-12 DIAGNOSIS — F331 Major depressive disorder, recurrent, moderate: Secondary | ICD-10-CM | POA: Diagnosis not present

## 2023-06-12 DIAGNOSIS — F419 Anxiety disorder, unspecified: Secondary | ICD-10-CM | POA: Diagnosis not present

## 2023-06-12 NOTE — Progress Notes (Signed)
 Virtual Visit via Video Note  I connected with Samuel Stanley on 06/12/23 at  3:00 PM EDT by a video enabled telemedicine application and verified that I am speaking with the correct person using two identifiers.  Location: Patient: Home Provider: Office   I discussed the limitations of evaluation and management by telemedicine and the availability of in person appointments. The patient expressed understanding and agreed to proceed.   THERAPIST PROGRESS NOTE   Session Time: 3:00 PM-3:55 PM  Participation Level: Active   Behavioral Response: CasualAlertAnxious   Type of Therapy: Individual Therapy   Treatment Goals addressed: Coping Anger Management   Interventions: CBT, Motivational Interviewing, Solution Focused and Supportive   Summary: [name] is a 33 y.o. male who presents with  Depression and anxiety. The OPT therapist worked with the patient for his scheduled session. The OPT therapist utilized Motivational Interviewing to assist in creating therapeutic repore. The patient spoke about interactions over the past few weeks moving into Spring and enjoying the warmer weather and getting out of the house more frequently. The OPT therapist utilized Cognitive Behavioral Therapy through cognitive restructuring as well as worked with the patient on coping strategies to assist in management of GAD. The patient continues to utlize his existing support network with individual effort to maintain as much independence as he can.The patient continues to struggle with his physical health condition and continues work with his physical therapist .The OPT therapist worked with the patient encouraging positive communication with his partner from a non judgmental way. The patient spoke about looking forward to the upcoming Easter holiday and going to a new church. The OPT therapist over-viewed with the patient upcoming appointments as listed in his MyChart.   Suicidal/Homicidal: Nowithout intent/plan    Therapist Response: The OPT therapist worked with the patient for the patients scheduled session. The patient was engaged in his session and gave feedback in relation to triggers, symptoms, and behavior responses over the past few weeks . The patient spoke about interactions over the past few weeks moving out of Winter and into the Spring season. The patient spoke about spending time with his wife and attending family t ball games. The patient spoke about looking forward to [place] holiday and going to his church.  The OPT therapist worked with the patient utilizing an in session Cognitive Behavioral Therapy exercise. The patient was responsive in the session and verbalized," I am done right now with my physical therapy until I go back and see my doctor at [facility] and they might tell me to do more PT in the future ". The OPT therapist worked with the patient on communication with his partner. The OPT therapist worked with the patient overviewing his support symptoms and coping strategies. The patient spoke about his work to stay active in the home while his wife is at work helping with what he can in relation to in home cleaning, chores, and pet caregiver. . The OPT therapist overviewed the interaction between the patient and his wife and the patient indicated over the past few weeks interaction has been positive. The patient spoke about going out with his wife and celebrating his wife's 33rd birthday with her. The OPT therapist worked with the patient overviewing appointments listed in the patients MyChart. The OPT therapist will continue treatment work with the patient in his next scheduled session.     Plan: Return again in 2/3 weeks.   Diagnosis:      Axis I: Recurrent moderate major depressive disorder  with anxiety                            Axis II: No diagnosis     Collaboration of Care: No additional collaboration of care .   Patient/Guardian was advised Release of Information must be obtained  prior to any record release in order to collaborate their care with an outside provider. Patient/Guardian was advised if they have not already done so to contact the registration department to sign all necessary forms in order for [place] to release information regarding their care.    Consent: Patient/Guardian gives verbal consent for treatment and assignment of benefits for services provided during this visit. Patient/Guardian expressed understanding and agreed to proceed.    I discussed the assessment and treatment plan with the patient. The patient was provided an opportunity to ask questions and all were answered. The patient agreed with the plan and demonstrated an understanding of the instructions.   The patient was advised to call back or seek an in-person evaluation if the symptoms worsen or if the condition fails to improve as anticipated.   I provided 55 minutes of non-face-to-face time during this encounter.   Winfred Burn, LCSW   06/12/2023

## 2023-07-16 DIAGNOSIS — H52223 Regular astigmatism, bilateral: Secondary | ICD-10-CM | POA: Diagnosis not present

## 2023-07-16 DIAGNOSIS — H5213 Myopia, bilateral: Secondary | ICD-10-CM | POA: Diagnosis not present

## 2023-07-26 ENCOUNTER — Telehealth (HOSPITAL_COMMUNITY): Payer: Self-pay | Admitting: *Deleted

## 2023-07-26 ENCOUNTER — Other Ambulatory Visit (HOSPITAL_COMMUNITY): Payer: Self-pay

## 2023-07-26 MED ORDER — BUPROPION HCL ER (XL) 300 MG PO TB24
300.0000 mg | ORAL_TABLET | Freq: Every day | ORAL | 1 refills | Status: DC
  Filled 2023-07-26: qty 90, 90d supply, fill #0
  Filled 2023-10-25: qty 90, 90d supply, fill #1

## 2023-07-26 NOTE — Telephone Encounter (Signed)
 Per pt wife, she would like for provider to know that patient grandfather is in hospice and they are thinking he is going to pass in the next couple of weeks. Just wanted provider to know this before his appt.

## 2023-07-27 ENCOUNTER — Ambulatory Visit (HOSPITAL_COMMUNITY): Payer: Medicare (Managed Care) | Admitting: Clinical

## 2023-08-16 DIAGNOSIS — E782 Mixed hyperlipidemia: Secondary | ICD-10-CM | POA: Diagnosis not present

## 2023-08-16 DIAGNOSIS — Z6829 Body mass index (BMI) 29.0-29.9, adult: Secondary | ICD-10-CM | POA: Diagnosis not present

## 2023-08-16 DIAGNOSIS — I10 Essential (primary) hypertension: Secondary | ICD-10-CM | POA: Diagnosis not present

## 2023-08-16 DIAGNOSIS — F331 Major depressive disorder, recurrent, moderate: Secondary | ICD-10-CM | POA: Diagnosis not present

## 2023-08-16 DIAGNOSIS — G43009 Migraine without aura, not intractable, without status migrainosus: Secondary | ICD-10-CM | POA: Diagnosis not present

## 2023-08-16 DIAGNOSIS — E7849 Other hyperlipidemia: Secondary | ICD-10-CM | POA: Diagnosis not present

## 2023-12-07 DIAGNOSIS — Z6823 Body mass index (BMI) 23.0-23.9, adult: Secondary | ICD-10-CM | POA: Diagnosis not present

## 2023-12-07 DIAGNOSIS — Z23 Encounter for immunization: Secondary | ICD-10-CM | POA: Diagnosis not present

## 2023-12-07 DIAGNOSIS — E782 Mixed hyperlipidemia: Secondary | ICD-10-CM | POA: Diagnosis not present

## 2023-12-07 DIAGNOSIS — F331 Major depressive disorder, recurrent, moderate: Secondary | ICD-10-CM | POA: Diagnosis not present

## 2023-12-07 DIAGNOSIS — I10 Essential (primary) hypertension: Secondary | ICD-10-CM | POA: Diagnosis not present

## 2023-12-07 DIAGNOSIS — E7849 Other hyperlipidemia: Secondary | ICD-10-CM | POA: Diagnosis not present

## 2023-12-07 DIAGNOSIS — G43009 Migraine without aura, not intractable, without status migrainosus: Secondary | ICD-10-CM | POA: Diagnosis not present

## 2024-01-28 DIAGNOSIS — G119 Hereditary ataxia, unspecified: Secondary | ICD-10-CM | POA: Diagnosis not present

## 2024-01-28 DIAGNOSIS — F39 Unspecified mood [affective] disorder: Secondary | ICD-10-CM | POA: Diagnosis not present

## 2024-02-19 ENCOUNTER — Other Ambulatory Visit (HOSPITAL_COMMUNITY): Payer: Self-pay

## 2024-02-19 MED ORDER — BUPROPION HCL ER (XL) 300 MG PO TB24
300.0000 mg | ORAL_TABLET | Freq: Every day | ORAL | 1 refills | Status: AC
  Filled 2024-02-19: qty 90, 90d supply, fill #0
